# Patient Record
Sex: Female | Born: 1955
Health system: Southern US, Community
[De-identification: ages and names within clinical notes are randomized; demographics above are authoritative.]

## PROBLEM LIST (undated history)

## (undated) DIAGNOSIS — R920 Mammographic microcalcification found on diagnostic imaging of breast: Secondary | ICD-10-CM

## (undated) DIAGNOSIS — Z9221 Personal history of antineoplastic chemotherapy: Secondary | ICD-10-CM

## (undated) DIAGNOSIS — T7840XA Allergy, unspecified, initial encounter: Secondary | ICD-10-CM

## (undated) DIAGNOSIS — K219 Gastro-esophageal reflux disease without esophagitis: Secondary | ICD-10-CM

## (undated) DIAGNOSIS — C50419 Malignant neoplasm of upper-outer quadrant of unspecified female breast: Secondary | ICD-10-CM

## (undated) DIAGNOSIS — M1990 Unspecified osteoarthritis, unspecified site: Secondary | ICD-10-CM

## (undated) DIAGNOSIS — E119 Type 2 diabetes mellitus without complications: Secondary | ICD-10-CM

## (undated) DIAGNOSIS — F329 Major depressive disorder, single episode, unspecified: Secondary | ICD-10-CM

## (undated) DIAGNOSIS — Z923 Personal history of irradiation: Secondary | ICD-10-CM

## (undated) DIAGNOSIS — F32A Depression, unspecified: Secondary | ICD-10-CM

## (undated) DIAGNOSIS — E785 Hyperlipidemia, unspecified: Secondary | ICD-10-CM

## (undated) DIAGNOSIS — R7309 Other abnormal glucose: Secondary | ICD-10-CM

## (undated) DIAGNOSIS — I10 Essential (primary) hypertension: Secondary | ICD-10-CM

## (undated) DIAGNOSIS — M75100 Unspecified rotator cuff tear or rupture of unspecified shoulder, not specified as traumatic: Secondary | ICD-10-CM

## (undated) DIAGNOSIS — R0600 Dyspnea, unspecified: Secondary | ICD-10-CM

## (undated) DIAGNOSIS — C50919 Malignant neoplasm of unspecified site of unspecified female breast: Secondary | ICD-10-CM

## (undated) HISTORY — DX: Essential (primary) hypertension: I10

## (undated) HISTORY — DX: Mammographic microcalcification found on diagnostic imaging of breast: R92.0

## (undated) HISTORY — PX: OTHER SURGICAL HISTORY: SHX169

## (undated) HISTORY — DX: Malignant neoplasm of unspecified site of unspecified female breast: C50.919

## (undated) HISTORY — DX: Unspecified osteoarthritis, unspecified site: M19.90

## (undated) HISTORY — PX: EYE SURGERY: SHX253

## (undated) HISTORY — DX: Morbid (severe) obesity due to excess calories: E66.01

## (undated) HISTORY — DX: Depression, unspecified: F32.A

## (undated) HISTORY — DX: Type 2 diabetes mellitus without complications: E11.9

## (undated) HISTORY — DX: Other abnormal glucose: R73.09

## (undated) HISTORY — DX: Malignant neoplasm of upper-outer quadrant of unspecified female breast: C50.419

## (undated) HISTORY — DX: Hyperlipidemia, unspecified: E78.5

## (undated) HISTORY — PX: COLONOSCOPY: SHX174

## (undated) HISTORY — DX: Allergy, unspecified, initial encounter: T78.40XA

## (undated) HISTORY — PX: TUBAL LIGATION: SHX77

---
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
## 2002-06-13 ENCOUNTER — Other Ambulatory Visit: Admission: RE | Admit: 2002-06-13 | Discharge: 2002-06-13 | Payer: Self-pay | Admitting: Family Medicine

## 2003-05-27 DIAGNOSIS — R7309 Other abnormal glucose: Secondary | ICD-10-CM

## 2003-05-27 HISTORY — DX: Other abnormal glucose: R73.09

## 2003-09-07 ENCOUNTER — Other Ambulatory Visit: Admission: RE | Admit: 2003-09-07 | Discharge: 2003-09-07 | Payer: Self-pay | Admitting: Family Medicine

## 2004-10-09 ENCOUNTER — Ambulatory Visit: Payer: Self-pay | Admitting: Family Medicine

## 2004-10-10 ENCOUNTER — Ambulatory Visit: Payer: Self-pay | Admitting: Family Medicine

## 2004-10-14 ENCOUNTER — Ambulatory Visit: Payer: Self-pay | Admitting: Family Medicine

## 2004-10-14 ENCOUNTER — Other Ambulatory Visit: Admission: RE | Admit: 2004-10-14 | Discharge: 2004-10-14 | Payer: Self-pay | Admitting: Family Medicine

## 2005-01-19 ENCOUNTER — Ambulatory Visit: Payer: Self-pay | Admitting: Family Medicine

## 2005-02-12 LAB — FECAL OCCULT BLOOD, GUAIAC: Fecal Occult Blood: NEGATIVE

## 2005-05-18 ENCOUNTER — Ambulatory Visit: Payer: Self-pay | Admitting: Family Medicine

## 2005-07-16 ENCOUNTER — Ambulatory Visit: Payer: Self-pay | Admitting: Family Medicine

## 2005-07-31 ENCOUNTER — Ambulatory Visit: Payer: Self-pay | Admitting: Family Medicine

## 2005-08-04 ENCOUNTER — Ambulatory Visit: Payer: Self-pay | Admitting: Family Medicine

## 2005-08-04 ENCOUNTER — Encounter (INDEPENDENT_AMBULATORY_CARE_PROVIDER_SITE_OTHER): Payer: Self-pay | Admitting: *Deleted

## 2005-08-04 ENCOUNTER — Other Ambulatory Visit: Admission: RE | Admit: 2005-08-04 | Discharge: 2005-08-04 | Payer: Self-pay | Admitting: Family Medicine

## 2005-08-19 ENCOUNTER — Ambulatory Visit: Payer: Self-pay | Admitting: Family Medicine

## 2005-10-12 ENCOUNTER — Ambulatory Visit: Payer: Self-pay | Admitting: Family Medicine

## 2006-03-12 ENCOUNTER — Ambulatory Visit: Payer: Self-pay | Admitting: Family Medicine

## 2006-06-03 ENCOUNTER — Ambulatory Visit: Payer: Self-pay | Admitting: Family Medicine

## 2006-10-31 DIAGNOSIS — I10 Essential (primary) hypertension: Secondary | ICD-10-CM | POA: Insufficient documentation

## 2006-11-04 ENCOUNTER — Ambulatory Visit: Payer: Self-pay | Admitting: Family Medicine

## 2006-11-10 ENCOUNTER — Ambulatory Visit: Payer: Self-pay | Admitting: Family Medicine

## 2006-12-16 ENCOUNTER — Ambulatory Visit: Payer: Self-pay | Admitting: Family Medicine

## 2007-02-01 ENCOUNTER — Ambulatory Visit: Payer: Self-pay | Admitting: Family Medicine

## 2007-02-01 LAB — CONVERTED CEMR LAB
ALT: 22 units/L (ref 0–40)
AST: 19 units/L (ref 0–37)
Albumin: 3.5 g/dL (ref 3.5–5.2)
Alkaline Phosphatase: 59 units/L (ref 39–117)
BUN: 10 mg/dL (ref 6–23)
Bilirubin, Direct: 0.1 mg/dL (ref 0.0–0.3)
CO2: 34 meq/L — ABNORMAL HIGH (ref 19–32)
Calcium: 9.1 mg/dL (ref 8.4–10.5)
Chloride: 106 meq/L (ref 96–112)
Cholesterol: 155 mg/dL (ref 0–200)
Creatinine, Ser: 0.8 mg/dL (ref 0.4–1.2)
Creatinine,U: 162.9 mg/dL
GFR calc Af Amer: 97 mL/min
GFR calc non Af Amer: 80 mL/min
Glucose, Bld: 111 mg/dL — ABNORMAL HIGH (ref 70–99)
HDL: 39.9 mg/dL (ref >39.0)
LDL Cholesterol: 94 mg/dL (ref 0–99)
Microalb Creat Ratio: 12.9 mg/g (ref 0.0–30.0)
Microalb, Ur: 2.1 mg/dL — ABNORMAL HIGH (ref 0.0–1.9)
Microalbumin U total vol: 12.9 mg/L
Potassium: 4.3 meq/L (ref 3.5–5.1)
Sodium: 144 meq/L (ref 135–145)
TSH: 3.41 microintl units/mL (ref 0.35–5.50)
Total Bilirubin: 0.5 mg/dL (ref 0.3–1.2)
Total CHOL/HDL Ratio: 3.9
Total Protein: 6.7 g/dL (ref 6.0–8.3)
Triglycerides: 105 mg/dL (ref 0–149)
VLDL: 21 mg/dL (ref 0–40)

## 2007-02-07 ENCOUNTER — Other Ambulatory Visit: Admission: RE | Admit: 2007-02-07 | Discharge: 2007-02-07 | Payer: Self-pay | Admitting: Family Medicine

## 2007-02-07 ENCOUNTER — Ambulatory Visit: Payer: Self-pay | Admitting: Family Medicine

## 2007-02-07 ENCOUNTER — Encounter: Payer: Self-pay | Admitting: Family Medicine

## 2007-02-07 LAB — CONVERTED CEMR LAB: Pap Smear: NORMAL

## 2007-02-17 ENCOUNTER — Ambulatory Visit: Payer: Self-pay | Admitting: Orthopedic Surgery

## 2007-02-24 LAB — CONVERTED CEMR LAB
OCCULT 1: NEGATIVE
OCCULT 2: NEGATIVE
OCCULT 3: NEGATIVE

## 2007-02-25 ENCOUNTER — Ambulatory Visit: Payer: Self-pay | Admitting: Orthopedic Surgery

## 2007-02-25 ENCOUNTER — Other Ambulatory Visit: Payer: Self-pay

## 2007-03-04 ENCOUNTER — Ambulatory Visit: Payer: Self-pay | Admitting: Orthopedic Surgery

## 2007-03-04 HISTORY — PX: KNEE ARTHROSCOPY: SUR90

## 2007-03-07 ENCOUNTER — Ambulatory Visit: Payer: Self-pay | Admitting: Family Medicine

## 2007-03-08 ENCOUNTER — Encounter: Payer: Self-pay | Admitting: Family Medicine

## 2007-03-18 ENCOUNTER — Encounter: Payer: Self-pay | Admitting: Family Medicine

## 2007-03-21 DIAGNOSIS — T7840XA Allergy, unspecified, initial encounter: Secondary | ICD-10-CM | POA: Insufficient documentation

## 2007-03-21 DIAGNOSIS — R739 Hyperglycemia, unspecified: Secondary | ICD-10-CM | POA: Insufficient documentation

## 2007-03-22 ENCOUNTER — Ambulatory Visit: Payer: Self-pay | Admitting: Family Medicine

## 2007-04-07 ENCOUNTER — Ambulatory Visit: Payer: Self-pay | Admitting: Gastroenterology

## 2007-04-21 ENCOUNTER — Encounter: Payer: Self-pay | Admitting: Gastroenterology

## 2007-04-21 ENCOUNTER — Encounter: Payer: Self-pay | Admitting: Family Medicine

## 2007-04-21 ENCOUNTER — Ambulatory Visit: Payer: Self-pay | Admitting: Gastroenterology

## 2007-04-21 LAB — HM COLONOSCOPY: HM Colonoscopy: ABNORMAL

## 2007-05-12 ENCOUNTER — Ambulatory Visit: Payer: Self-pay | Admitting: Family Medicine

## 2007-06-21 ENCOUNTER — Encounter: Payer: Self-pay | Admitting: Family Medicine

## 2007-06-30 ENCOUNTER — Ambulatory Visit: Payer: Self-pay | Admitting: Family Medicine

## 2007-07-25 ENCOUNTER — Other Ambulatory Visit: Payer: Self-pay

## 2007-07-25 ENCOUNTER — Ambulatory Visit: Payer: Self-pay | Admitting: Orthopedic Surgery

## 2007-07-30 HISTORY — PX: JOINT REPLACEMENT: SHX530

## 2007-08-03 ENCOUNTER — Inpatient Hospital Stay: Payer: Self-pay | Admitting: Orthopedic Surgery

## 2007-08-23 ENCOUNTER — Ambulatory Visit: Payer: Self-pay | Admitting: Family Medicine

## 2008-02-09 ENCOUNTER — Ambulatory Visit: Payer: Self-pay | Admitting: Family Medicine

## 2008-02-09 LAB — CONVERTED CEMR LAB
ALT: 25 units/L (ref 0–35)
AST: 23 units/L (ref 0–37)
Albumin: 3.7 g/dL (ref 3.5–5.2)
Alkaline Phosphatase: 73 units/L (ref 39–117)
BUN: 7 mg/dL (ref 6–23)
Bilirubin, Direct: 0.1 mg/dL (ref 0.0–0.3)
CO2: 33 meq/L — ABNORMAL HIGH (ref 19–32)
Calcium: 9.3 mg/dL (ref 8.4–10.5)
Chloride: 103 meq/L (ref 96–112)
Cholesterol: 149 mg/dL (ref 0–200)
Creatinine, Ser: 0.9 mg/dL (ref 0.4–1.2)
Creatinine,U: 162.1 mg/dL
GFR calc Af Amer: 84 mL/min
GFR calc non Af Amer: 70 mL/min
Glucose, Bld: 111 mg/dL — ABNORMAL HIGH (ref 70–99)
HDL: 37.7 mg/dL — ABNORMAL LOW (ref >39.0)
LDL Cholesterol: 90 mg/dL (ref 0–99)
Microalb Creat Ratio: 4.3 mg/g (ref 0.0–30.0)
Microalb, Ur: 0.7 mg/dL (ref 0.0–1.9)
Potassium: 4.3 meq/L (ref 3.5–5.1)
Sodium: 139 meq/L (ref 135–145)
TSH: 2.55 microintl units/mL (ref 0.35–5.50)
Total Bilirubin: 0.6 mg/dL (ref 0.3–1.2)
Total CHOL/HDL Ratio: 4
Total Protein: 6.9 g/dL (ref 6.0–8.3)
Triglycerides: 107 mg/dL (ref 0–149)
VLDL: 21 mg/dL (ref 0–40)

## 2008-02-14 ENCOUNTER — Ambulatory Visit: Payer: Self-pay | Admitting: Family Medicine

## 2008-02-14 ENCOUNTER — Other Ambulatory Visit: Admission: RE | Admit: 2008-02-14 | Discharge: 2008-02-14 | Payer: Self-pay | Admitting: Family Medicine

## 2008-02-14 ENCOUNTER — Encounter: Payer: Self-pay | Admitting: Family Medicine

## 2008-02-21 ENCOUNTER — Encounter (INDEPENDENT_AMBULATORY_CARE_PROVIDER_SITE_OTHER): Payer: Self-pay | Admitting: *Deleted

## 2008-02-21 ENCOUNTER — Ambulatory Visit: Payer: Self-pay | Admitting: Family Medicine

## 2008-02-21 ENCOUNTER — Encounter: Payer: Self-pay | Admitting: Family Medicine

## 2008-02-23 ENCOUNTER — Encounter (INDEPENDENT_AMBULATORY_CARE_PROVIDER_SITE_OTHER): Payer: Self-pay | Admitting: *Deleted

## 2008-02-24 ENCOUNTER — Telehealth: Payer: Self-pay | Admitting: Family Medicine

## 2008-03-28 ENCOUNTER — Ambulatory Visit: Payer: Self-pay | Admitting: Family Medicine

## 2008-03-29 LAB — CONVERTED CEMR LAB
ALT: 35 units/L (ref 0–35)
AST: 28 units/L (ref 0–37)

## 2008-05-11 ENCOUNTER — Ambulatory Visit: Payer: Self-pay | Admitting: Family Medicine

## 2008-05-13 LAB — CONVERTED CEMR LAB
ALT: 34 units/L (ref 0–35)
AST: 29 units/L (ref 0–37)
Cholesterol: 154 mg/dL (ref 0–200)
HDL: 33.1 mg/dL — ABNORMAL LOW (ref >39.0)
LDL Cholesterol: 99 mg/dL (ref 0–99)
Total CHOL/HDL Ratio: 4.7
Triglycerides: 112 mg/dL (ref 0–149)
VLDL: 22 mg/dL (ref 0–40)

## 2008-05-17 ENCOUNTER — Ambulatory Visit: Payer: Self-pay | Admitting: Family Medicine

## 2008-10-05 ENCOUNTER — Telehealth: Payer: Self-pay | Admitting: Family Medicine

## 2008-10-15 ENCOUNTER — Ambulatory Visit: Payer: Self-pay | Admitting: Family Medicine

## 2008-10-15 LAB — CONVERTED CEMR LAB: Glucose, Bld: 96 mg/dL (ref 70–99)

## 2008-10-23 ENCOUNTER — Ambulatory Visit: Payer: Self-pay | Admitting: Family Medicine

## 2009-02-08 ENCOUNTER — Ambulatory Visit: Payer: Self-pay | Admitting: Family Medicine

## 2009-02-12 ENCOUNTER — Ambulatory Visit: Payer: Self-pay | Admitting: Family Medicine

## 2009-02-13 LAB — CONVERTED CEMR LAB
ALT: 22 units/L (ref 0–35)
AST: 19 units/L (ref 0–37)
Albumin: 3.4 g/dL — ABNORMAL LOW (ref 3.5–5.2)
Alkaline Phosphatase: 74 units/L (ref 39–117)
BUN: 10 mg/dL (ref 6–23)
Basophils Absolute: 0.1 10*3/uL (ref 0.0–0.1)
Basophils Relative: 0.7 % (ref 0.0–3.0)
Bilirubin, Direct: 0.1 mg/dL (ref 0.0–0.3)
CO2: 33 meq/L — ABNORMAL HIGH (ref 19–32)
Calcium: 9 mg/dL (ref 8.4–10.5)
Chloride: 105 meq/L (ref 96–112)
Cholesterol: 135 mg/dL (ref 0–200)
Creatinine, Ser: 0.9 mg/dL (ref 0.4–1.2)
Creatinine,U: 232.8 mg/dL
Eosinophils Absolute: 0.5 10*3/uL (ref 0.0–0.7)
Eosinophils Relative: 4.8 % (ref 0.0–5.0)
GFR calc non Af Amer: 69.2 mL/min (ref >60)
Glucose, Bld: 99 mg/dL (ref 70–99)
HCT: 43.5 % (ref 36.0–46.0)
HDL: 29.5 mg/dL — ABNORMAL LOW (ref >39.00)
Hemoglobin: 15.1 g/dL — ABNORMAL HIGH (ref 12.0–15.0)
LDL Cholesterol: 79 mg/dL (ref 0–99)
Lymphocytes Relative: 24.4 % (ref 12.0–46.0)
Lymphs Abs: 2.4 10*3/uL (ref 0.7–4.0)
MCHC: 34.7 g/dL (ref 30.0–36.0)
MCV: 89.5 fL (ref 78.0–100.0)
Microalb Creat Ratio: 0.9 mg/g (ref 0.0–30.0)
Microalb, Ur: 0.2 mg/dL (ref 0.0–1.9)
Monocytes Absolute: 0.6 10*3/uL (ref 0.1–1.0)
Monocytes Relative: 6.2 % (ref 3.0–12.0)
Neutro Abs: 6.1 10*3/uL (ref 1.4–7.7)
Neutrophils Relative %: 63.9 % (ref 43.0–77.0)
Platelets: 250 10*3/uL (ref 150.0–400.0)
Potassium: 4.3 meq/L (ref 3.5–5.1)
RBC: 4.86 M/uL (ref 3.87–5.11)
RDW: 12.5 % (ref 11.5–14.6)
Sodium: 142 meq/L (ref 135–145)
TSH: 2.76 microintl units/mL (ref 0.35–5.50)
Total Bilirubin: 0.6 mg/dL (ref 0.3–1.2)
Total CHOL/HDL Ratio: 5
Total Protein: 6.7 g/dL (ref 6.0–8.3)
Triglycerides: 132 mg/dL (ref 0.0–149.0)
VLDL: 26.4 mg/dL (ref 0.0–40.0)
WBC: 9.7 10*3/uL (ref 4.5–10.5)

## 2009-02-19 ENCOUNTER — Ambulatory Visit: Payer: Self-pay | Admitting: Family Medicine

## 2009-02-19 ENCOUNTER — Encounter: Payer: Self-pay | Admitting: Family Medicine

## 2009-02-19 ENCOUNTER — Other Ambulatory Visit: Admission: RE | Admit: 2009-02-19 | Discharge: 2009-02-19 | Payer: Self-pay | Admitting: Family Medicine

## 2009-02-21 ENCOUNTER — Encounter (INDEPENDENT_AMBULATORY_CARE_PROVIDER_SITE_OTHER): Payer: Self-pay | Admitting: *Deleted

## 2009-05-21 ENCOUNTER — Ambulatory Visit: Payer: Self-pay | Admitting: Family Medicine

## 2009-09-03 ENCOUNTER — Ambulatory Visit: Payer: Self-pay | Admitting: Family Medicine

## 2009-10-15 ENCOUNTER — Ambulatory Visit: Payer: Self-pay | Admitting: Family Medicine

## 2009-10-15 LAB — CONVERTED CEMR LAB
BUN: 8 mg/dL (ref 6–23)
CO2: 31 meq/L (ref 19–32)
Calcium: 9 mg/dL (ref 8.4–10.5)
Chloride: 104 meq/L (ref 96–112)
Creatinine, Ser: 0.9 mg/dL (ref 0.4–1.2)
GFR calc non Af Amer: 69.02 mL/min (ref >60)
Glucose, Bld: 114 mg/dL — ABNORMAL HIGH (ref 70–99)
Potassium: 4.6 meq/L (ref 3.5–5.1)
Sodium: 142 meq/L (ref 135–145)

## 2009-10-22 ENCOUNTER — Ambulatory Visit: Payer: Self-pay | Admitting: Family Medicine

## 2009-10-22 ENCOUNTER — Telehealth: Payer: Self-pay | Admitting: Family Medicine

## 2010-02-26 ENCOUNTER — Ambulatory Visit: Payer: Self-pay | Admitting: Family Medicine

## 2010-02-26 ENCOUNTER — Telehealth: Payer: Self-pay | Admitting: Family Medicine

## 2010-02-26 ENCOUNTER — Other Ambulatory Visit: Admission: RE | Admit: 2010-02-26 | Discharge: 2010-02-26 | Payer: Self-pay | Admitting: Family Medicine

## 2010-02-26 DIAGNOSIS — K5900 Constipation, unspecified: Secondary | ICD-10-CM | POA: Insufficient documentation

## 2010-02-26 DIAGNOSIS — K644 Residual hemorrhoidal skin tags: Secondary | ICD-10-CM | POA: Insufficient documentation

## 2010-02-26 LAB — CONVERTED CEMR LAB: Pap Smear: NORMAL

## 2010-02-26 LAB — HM PAP SMEAR

## 2010-03-04 LAB — CONVERTED CEMR LAB: Pap Smear: NEGATIVE

## 2010-03-05 ENCOUNTER — Encounter (INDEPENDENT_AMBULATORY_CARE_PROVIDER_SITE_OTHER): Payer: Self-pay | Admitting: *Deleted

## 2010-03-13 ENCOUNTER — Ambulatory Visit: Payer: Self-pay | Admitting: Family Medicine

## 2010-03-13 LAB — CONVERTED CEMR LAB
ALT: 25 units/L (ref 0–35)
AST: 20 units/L (ref 0–37)
Albumin: 3.6 g/dL (ref 3.5–5.2)
Alkaline Phosphatase: 70 units/L (ref 39–117)
BUN: 13 mg/dL (ref 6–23)
Basophils Absolute: 0.1 10*3/uL (ref 0.0–0.1)
Basophils Relative: 0.5 % (ref 0.0–3.0)
Bilirubin, Direct: 0.1 mg/dL (ref 0.0–0.3)
CO2: 33 meq/L — ABNORMAL HIGH (ref 19–32)
Calcium: 9.2 mg/dL (ref 8.4–10.5)
Chloride: 101 meq/L (ref 96–112)
Cholesterol: 163 mg/dL (ref 0–200)
Creatinine, Ser: 0.8 mg/dL (ref 0.4–1.2)
Creatinine,U: 132.7 mg/dL
Eosinophils Absolute: 0.5 10*3/uL (ref 0.0–0.7)
Eosinophils Relative: 4.3 % (ref 0.0–5.0)
GFR calc non Af Amer: 78.96 mL/min (ref >60)
Glucose, Bld: 106 mg/dL — ABNORMAL HIGH (ref 70–99)
HCT: 43.1 % (ref 36.0–46.0)
HDL: 39.4 mg/dL (ref >39.00)
Hemoglobin: 14.7 g/dL (ref 12.0–15.0)
LDL Cholesterol: 101 mg/dL — ABNORMAL HIGH (ref 0–99)
Lymphocytes Relative: 24.3 % (ref 12.0–46.0)
Lymphs Abs: 2.7 10*3/uL (ref 0.7–4.0)
MCHC: 34.1 g/dL (ref 30.0–36.0)
MCV: 91.4 fL (ref 78.0–100.0)
Microalb Creat Ratio: 1.7 mg/g (ref 0.0–30.0)
Microalb, Ur: 2.2 mg/dL — ABNORMAL HIGH (ref 0.0–1.9)
Monocytes Absolute: 0.7 10*3/uL (ref 0.1–1.0)
Monocytes Relative: 6.3 % (ref 3.0–12.0)
Neutro Abs: 7.2 10*3/uL (ref 1.4–7.7)
Neutrophils Relative %: 64.6 % (ref 43.0–77.0)
Platelets: 236 10*3/uL (ref 150.0–400.0)
Potassium: 4.4 meq/L (ref 3.5–5.1)
RBC: 4.72 M/uL (ref 3.87–5.11)
RDW: 13.3 % (ref 11.5–14.6)
Sodium: 139 meq/L (ref 135–145)
TSH: 3.15 microintl units/mL (ref 0.35–5.50)
Total Bilirubin: 0.4 mg/dL (ref 0.3–1.2)
Total CHOL/HDL Ratio: 4
Total Protein: 6.7 g/dL (ref 6.0–8.3)
Triglycerides: 114 mg/dL (ref 0.0–149.0)
VLDL: 22.8 mg/dL (ref 0.0–40.0)
WBC: 11.1 10*3/uL — ABNORMAL HIGH (ref 4.5–10.5)

## 2010-03-18 ENCOUNTER — Ambulatory Visit: Payer: Self-pay | Admitting: Family Medicine

## 2010-04-11 ENCOUNTER — Encounter: Payer: Self-pay | Admitting: Family Medicine

## 2010-04-11 ENCOUNTER — Ambulatory Visit: Payer: Self-pay | Admitting: Family Medicine

## 2010-04-11 LAB — HM MAMMOGRAPHY: HM Mammogram: NORMAL

## 2010-04-14 ENCOUNTER — Encounter (INDEPENDENT_AMBULATORY_CARE_PROVIDER_SITE_OTHER): Payer: Self-pay | Admitting: *Deleted

## 2010-06-02 ENCOUNTER — Encounter (INDEPENDENT_AMBULATORY_CARE_PROVIDER_SITE_OTHER): Payer: Self-pay | Admitting: *Deleted

## 2010-07-27 ENCOUNTER — Observation Stay (HOSPITAL_COMMUNITY): Admission: EM | Admit: 2010-07-27 | Discharge: 2010-07-28 | Payer: Self-pay | Admitting: Emergency Medicine

## 2010-07-27 ENCOUNTER — Ambulatory Visit: Payer: Self-pay | Admitting: Cardiology

## 2010-08-11 ENCOUNTER — Encounter: Payer: Self-pay | Admitting: Cardiology

## 2010-08-11 ENCOUNTER — Ambulatory Visit: Payer: Self-pay | Admitting: Cardiology

## 2010-08-11 ENCOUNTER — Ambulatory Visit (HOSPITAL_COMMUNITY)
Admission: RE | Admit: 2010-08-11 | Discharge: 2010-08-11 | Payer: Self-pay | Source: Home / Self Care | Admitting: Cardiology

## 2010-08-11 ENCOUNTER — Ambulatory Visit: Payer: Self-pay

## 2010-08-25 ENCOUNTER — Encounter: Payer: Self-pay | Admitting: Physician Assistant

## 2010-08-25 ENCOUNTER — Ambulatory Visit: Payer: Self-pay | Admitting: Cardiology

## 2010-08-25 DIAGNOSIS — R05 Cough: Secondary | ICD-10-CM | POA: Insufficient documentation

## 2010-08-25 DIAGNOSIS — E785 Hyperlipidemia, unspecified: Secondary | ICD-10-CM | POA: Insufficient documentation

## 2010-08-25 DIAGNOSIS — R059 Cough, unspecified: Secondary | ICD-10-CM | POA: Insufficient documentation

## 2010-08-25 DIAGNOSIS — R079 Chest pain, unspecified: Secondary | ICD-10-CM | POA: Insufficient documentation

## 2010-11-25 NOTE — Assessment & Plan Note (Signed)
Summary: rash on back over a month/bir  Medications Added METOPROLOL SUCCINATE 100 MG TB24 (METOPROLOL SUCCINATE) 1 tablet by mouth every night TYLENOL PM EXTRA STRENGTH 500-25 MG  TABS (DIPHENHYDRAMINE-APAP (SLEEP)) 1 hs as needed ELOCON 0.1 %  CREA (MOMETASONE FUROATE) apply lthinnly two times a day to rash area      Allergies Added: NKDA  Vital Signs:  Patient Profile:   55 Years Old Female Weight:      269 pounds Temp:     98.9 degrees F oral Pulse rate:   91 / minute BP sitting:   109 / 92  (right arm) Cuff size:   large  Vitals Entered By: Cooper Render (May 12, 2007 3:59 PM)                Chief Complaint:  rash on back over 1 mth.  History of Present Illness: Here for rash on back x65mo, itches, no pain.  Has gotten larger,no drainage.  Not sure if any changes in detergent,etc in this period of time.  Current Allergies: No known allergies   Past Medical History:    Reviewed history from 03/18/2007 and no changes required:       Hypertension       Elevated glucose- 106 (05/2003)       Family hx. of polyps     Review of Systems      See HPI   Physical Exam  General:     alert, well-developed, well-nourished, well-hydrated, and overweight-appearing.   Head:     normocephalic.   Lungs:     normal respiratory effort.   Neurologic:     alert & oriented X3, sensation intact to light touch, and gait normal.   Skin:     flat pink non scaling plaque with faint/narrow darker pink boarder which is irregular.  Located under bra at the strap attatchment.  5x8cm at longest and widest Psych:     normally interactive.      Impression & Recommendations:  Problem # 1:  SKIN RASH (ICD-782.1) Assessment: New  Her updated medication list for this problem includes:    Elocon 0.1 % Crea (Mometasone furoate) .[name]... Apply lthinnly two times a day to rash area call or see back 1 wk, if not improved will refer to Derm   Medications Added to Medication List This Visit: 1)  Metoprolol Succinate 100 Mg Tb24 (Metoprolol succinate) .[name].. 1 tablet by mouth every night 2)  Tylenol Pm Extra Strength 500-25 Mg Tabs (Diphenhydramine-apap (sleep)) .[name].. 1 hs as needed 3)  Elocon 0.1 % Crea (Mometasone furoate) .... Apply lthinnly two times a day to rash area   Patient Instructions: 1)  reviewed plan with patient--agreed    Prescriptions: ELOCON 0.1 %  CREA (MOMETASONE FUROATE) apply lthinnly two times a day to rash area  #1 x 0   Entered and Authorized by:   Gildardo Griffes FNP   Signed by:   Gildardo Griffes FNP on 05/12/2007   Method used:   Print then Give to Patient   RxID:   [id number]

## 2010-11-25 NOTE — Assessment & Plan Note (Signed)
Summary: 3 MO. F/U RECHECK BP/BIR   Vital Signs:  Patient profile:   55 year old female Weight:      285 pounds Temp:     98.5 degrees F oral Pulse rate:   64 / minute Pulse rhythm:   regular BP sitting:   130 / 100  (left arm) Cuff size:   large  Vitals Entered By: Sydell Axon LPN (September 03, 2009 9:05 AM) CC: Follow-up on BP, has sinus drainage and a cough   History of Present Illness: Pt here for 3 month followupmfor BP elevation. Hadn't taken her medication last time because of rushing and thinks her pressure has been good since but hasn't taken it in the last few weeks. She feels well with no complaints.  Problems Prior to Update: 1)  Health Maintenance Exam  (ICD-V70.0) 2)  Hx, Family, Digestive Disorder, Colonic Polyp  (ICD-V18.51) 3)  Hyperglycemia  (ICD-790.6) 4)  Hx of Allergy  (ICD-995.3) 5)  Hx of Hypercholesterolemia  (ICD-272.0) 6)  Hypertension  (ICD-401.9) 7)  Screening For Malignannt Neoplasm, Site Nec  (ICD-V76.49)  Medications Prior to Update: 1)  Simvastatin 40 Mg  Tabs (Simvastatin) .... 2 Tabs By Mouth Hs 2)  Zyrtec Allergy 10 Mg Tabs (Cetirizine Hcl) .... One Tab By Mouth Once Daily As Needed Allergies. 3)  Metoprolol Tartrate 100 Mg Tabs (Metoprolol Tartrate) .... One Tab By Mouth Two Times A Day 4)  Tylenol Pm Extra Strength 500-25 Mg  Tabs (Diphenhydramine-Apap (Sleep)) .[name].. 1 Hs As Needed 5)  Lamisil At 1 %  Crea (Terbinafine Hcl) .... Apply To Area Bid 6)  Hycodan .[name].. 1 Tsp At Bedtime As Needed Cough, May Repeat in 4-6 Hrs If Needed--Caution Re Drowsiness 7)  Black Cohosh 160 Mg Caps (Black Cohosh) .[name].. 1 Daily By Mouth 8)  Lotemax 0.5 % Susp (Loteprednol Etabonate) .... 2 Dropps Both Eyes Twice A Day  Allergies: 1)  ! Sulfa  Physical Exam  General:  Well-developed,well-nourished,in no acute distress; alert,appropriate and cooperative throughout examination, obese.  Head:  Normocephalic and atraumatic without obvious abnormalities. No apparent alopecia or balding. Eyes:  Conjunctiva clear bilaterally.  PERRLA, EOMI Ears:  External ear exam shows no significant lesions or deformities.  Otoscopic examination reveals clear canals, tympanic membranes are intact bilaterally without bulging, retraction, inflammation or discharge. Hearing is grossly normal bilaterally. Nose:  External nasal examination shows no deformity or inflammation. Nasal mucosa are pink and moist without lesions or exudates. Mouth:  Oral mucosa and oropharynx without lesions or exudates.  Teeth in good repair. Neck:  No deformities, masses, or tenderness noted. Chest Wall:  No deformities, masses, or tenderness noted. Lungs:  Normal respiratory effort, chest expands symmetrically. Lungs are clear to auscultation, no crackles or wheezes. Heart:  Normal rate and regular rhythm. S1 and S2 normal without gallop, murmur, click, rub or other extra sounds.   Impression & Recommendations:  Problem # 1:  HYPERTENSION (ICD-401.9) Assessment Unchanged Some improvement but still high.  Add lisinopril with FH of diabetes. Start half tab fopr first week and then 10mg  a day. Her updated medication list for this problem includes:    Metoprolol Tartrate 100 Mg Tabs (Metoprolol tartrate) ..... One tab by mouth two times a day    Lisinopril 10 Mg Tabs (Lisinopril) ..... One tab by mouth at night  BP today: 130/100 Prior BP: 154/100 (05/21/2009)  Labs Reviewed: K+: 4.3 (02/12/2009) Creat: : 0.9 (02/12/2009)   Chol: 135 (02/12/2009)   HDL: 29.50 (02/12/2009)  LDL: 79 (02/12/2009)   TG: 132.0 (02/12/2009)  Complete Medication List: 1)  Simvastatin 40 Mg Tabs (Simvastatin) .... 2 tabs by mouth at bedtime 2)  Zyrtec Allergy 10 Mg Tabs (Cetirizine hcl) .... One tab by mouth once daily as needed allergies. 3)  Metoprolol Tartrate 100 Mg Tabs (Metoprolol tartrate) .... One tab by mouth two times a day  4)  Tylenol Pm Extra Strength 500-25 Mg Tabs (Diphenhydramine-apap (sleep)) .[name].. 1 hs as needed 5)  Lamisil At 1 % Crea (Terbinafine hcl) .... Apply to area bid 6)  Hycodan  .[name].. 1 tsp at bedtime as needed cough, may repeat in 4-6 hrs if needed--caution re drowsiness 7)  Black Cohosh 160 Mg Caps (Black cohosh) .[name].. 1 daily by mouth 8)  Lotemax 0.5 % Susp (Loteprednol etabonate) .... 2 dropps both eyes twice a day 9)  Lisinopril 10 Mg Tabs (Lisinopril) .... One tab by mouth at night  Patient Instructions: 1)  RTC 6 weeks for BP check. Bmet at 1 month 401.9 2)  RTC early May for Comp Exam, labs prior 3)  For congestion, Take Guaifenesin by going to [facility], [facility] or [facility] and getting MUCOUS RELIEF EXPECTORANT (400mg ), take 11/2 tabs by mouth AM and NOON. 4)  Drink lots of fluids anytime taking Guaifenesin.  Prescriptions: LISINOPRIL 10 MG TABS (LISINOPRIL) one tab by mouth at night  #30 x 12   Entered and Authorized by:   Shaune Leeks MD   Signed by:   Shaune Leeks MD on 09/03/2009   Method used:   Electronically to        [facility] [phone]* (retail)       [address]       [place], [place]  [address]       Ph: [phone]       Fax: [phone]   RxID:   [id number]   Current Allergies (reviewed today): ! SULFA

## 2010-11-25 NOTE — Procedures (Signed)
Summary: Gastroenterology  Gastroenterology   Imported By: Beau Fanny 04/27/2007 10:13:27  _____________________________________________________________________  External Attachment:    Type:   Image     Comment:   External Document

## 2010-11-25 NOTE — Assessment & Plan Note (Signed)
Summary: cpx/bir   Vital Signs:  Patient profile:   55 year old female Height:      65 inches Weight:      273 pounds Temp:     98.7 degrees F oral Pulse rate:   76 / minute Pulse rhythm:   regular BP sitting:   148 / 100  (left arm) Cuff size:   large  Vitals Entered By: Providence Crosby (February 19, 2009 10:57 AM) CC: check up// colonoscopy with dr. Russella Dar 04/21/2007// complains of tongue feeling funny since starting medication  minocin and hycodan and robitussin   History of Present Illness: Pt here for Comp Exam, doing well except for tongue feeling funny. Was treated one week ago for Bronchitis with minocin, Hycodan and Guaif. She otherwise feels well. Weight continues to be a problem....encouraged REGULAR exercise.  Preventive Screening-Counseling & Management     Alcohol drinks/day: <1     Alcohol type: liquor/wine     Smoking Status: quit     Year Quit: 1970     Pack years: 1     Passive Smoke Exposure: no     Caffeine use/day: 1     Does Patient Exercise: no  Problems Prior to Update: 1)  Bronchitis-acute  (ICD-466.0) 2)  Health Maintenance Exam  (ICD-V70.0) 3)  Fungal Dermatitis  (ICD-111.9) 4)  Hx, Family, Digestive Disorder, Colonic Polyp  (ICD-V18.51) 5)  Hyperglycemia  (ICD-790.6) 6)  Hx of Allergy  (ICD-995.3) 7)  Hx of Hypercholesterolemia  (ICD-272.0) 8)  Hypertension  (ICD-401.9) 9)  Screening For Malignannt Neoplasm, Site Nec  (ICD-V76.49)  Medications Prior to Update: 1)  Simvastatin 40 Mg  Tabs (Simvastatin) .... 2 Tabs By Mouth Hs 2)  Zyrtec Allergy 10 Mg Tabs (Cetirizine Hcl) .... One Tab By Mouth Once Daily As Needed Allergies. 3)  Metoprolol Tartrate 50 Mg  Tabs (Metoprolol Tartrate) .... One Tab By Mouth Two Times A Day 4)  Tylenol Pm Extra Strength 500-25 Mg  Tabs (Diphenhydramine-Apap (Sleep)) .[name].. 1 Hs As Needed 5)  Lamisil At 1 %  Crea (Terbinafine Hcl) .... Apply To Area Bid 6)  Minocin 100 Mg Caps (Minocycline Hcl) .[name].. 1 Two Times A Day  7)  Hycodan .[name].. 1 Tsp At Bedtime As Needed Cough, May Repeat in 4-6 Hrs If Needed--Caution Re Drowsiness  Allergies (verified): No Known Drug Allergies  Past History:  Past Medical History:    Hypertension    Elevated glucose- 106 (05/2003)    Family hx. of polyps     (03/18/2007)  Past Surgical History:    NSVD x 2    BTL 1978    L Knee Arthroscopy (Dr Kennith Center )  03/04/2007    Colonoscopy polyps  (Dr Russella Dar)  04/2007      2013    LTKR  (Dr Kennith Center) 07/30/2007                                 (02/14/2008)  Family History:    Father: A 60  polyps    Mother A 56  HTN, depression, back surgery, paranoia     Siblings: Brother A                  Brother A                  Brother A MI  PTCA     (06/30/2007)  Social History:    Marital Status:  Married    Children: 2    Occupation: Solicitor parttime     (02/14/2008)  Risk Factors:    Alcohol Use: <1 (06/30/2007)    >5 drinks/d w/in last 3 months: N/A    Caffeine Use: 1 (06/30/2007)    Diet: N/A    Exercise: no (06/30/2007)  Risk Factors:    Smoking Status: quit (03/18/2007)    Packs/Day: N/A    Cigars/wk: N/A    Pipe Use/wk: N/A    Cans of tobacco/wk: N/A    Passive Smoke Exposure: no (06/30/2007)  Family History:    Father: A 79  polyps Fx ankle              Robt and Earnest Conroy    Mother A 78  HTN, depression, back surgery, paranoia     Siblings: Brother A 62                  Brother A 53                  Brother A 42 MI  PTCA@39   Social History:    Marital Status: Married    Children: 2    Occupation:     Engineer, 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 Eyes:  Denies blurring, discharge, double vision, eye irritation, eye pain, halos, itching, light sensitivity, red eye, vision loss-1 eye, and vision loss-both eyes; allergies.  ENT:  Denies decreased hearing, difficulty swallowing, ear discharge, earache, hoarseness, nasal congestion, nosebleeds, postnasal drainage, ringing in ears, sinus pressure, and sore throat. CV:  Denies bluish discoloration of lips or nails, chest pain or discomfort, difficulty breathing at night, difficulty breathing while lying down, fainting, fatigue, leg cramps with exertion, lightheadness, near fainting, palpitations, shortness of breath with exertion, swelling of feet, swelling of hands, and weight gain. Resp:  Denies chest discomfort, chest pain with inspiration, cough, coughing up blood, excessive snoring, hypersomnolence, morning headaches, pleuritic, shortness of breath, sputum productive, and wheezing. GI:  Denies abdominal pain, bloody stools, change in bowel habits, constipation, dark tarry stools, diarrhea, excessive appetite, gas, hemorrhoids, indigestion, loss of appetite, nausea, vomiting, vomiting blood, and yellowish skin color. GU:  Denies abnormal vaginal bleeding, decreased libido, discharge, dysuria, genital sores, hematuria, incontinence, nocturia, urinary frequency, and urinary hesitancy; mild leakage with current cough. MS:  Denies joint pain, joint redness, joint swelling, loss of strength, low back pain, mid back pain, muscle aches, muscle , cramps, muscle weakness, stiffness, and thoracic pain. Derm:  Denies changes in color of skin, changes in nail beds, dryness, excessive perspiration, flushing, hair loss, insect bite(s), itching, lesion(s), poor wound healing, and rash. Neuro:  Denies brief paralysis, difficulty with concentration, disturbances in coordination, falling down, headaches, inability to speak, memory loss, numbness, poor balance, seizures, sensation of room spinning, tingling, tremors, visual disturbances, and weakness.  Physical Exam  General:  Well-developed,well-nourished,in no acute distress; alert,appropriate and cooperative throughout examination, obese.  Head:  Normocephalic and atraumatic without obvious abnormalities. No apparent alopecia or balding. Eyes:  Conjunctiva clear bilaterally.  PERRLA, EOMI Ears:  External ear exam shows no significant lesions or deformities.  Otoscopic examination reveals clear canals, tympanic membranes are intact bilaterally without bulging, retraction, inflammation or discharge. Hearing is grossly normal bilaterally. Nose:  External nasal examination shows no deformity or inflammation. Nasal mucosa are pink and moist without lesions  or exudates. Mouth:  Oral mucosa and oropharynx without lesions or exudates.  Teeth in good repair. Neck:  No deformities, masses, or tenderness noted. Chest Wall:  No deformities, masses, or tenderness noted. Breasts:  No mass, nodules, thickening, tenderness, bulging, retraction, inflamation, nipple discharge or skin changes noted.   Lungs:  Normal respiratory effort, chest expands symmetrically. Lungs are clear to auscultation, no crackles or wheezes. Heart:  Normal rate and regular rhythm. S1 and S2 normal without gallop, murmur, click, rub or other extra sounds. Abdomen:  Bowel sounds positive,abdomen soft and non-tender without masses, organomegaly or hernias noted. Protuberant. Rectal:  No external abnormalities noted. Normal sphincter tone. No rectal masses or tenderness. G neg. Genitalia:  Pelvic Exam:        External: normal female genitalia without lesions or masses        Vagina: normal without lesions or masses        Cervix: normal without lesions or masses        Adnexa: normal bimanual exam without masses or fullness        Uterus: normal by palpation        Pap smear: performed Msk:  No deformity or scoliosis noted of thoracic or lumbar spine.   Pulses:  R and L carotid,radial,femoral,dorsalis pedis and posterior tibial pulses are full and equal bilaterally Extremities:  Bilat little finger swelling and mild erythema of PIP and DIPs. R>L.  Neurologic:  No cranial nerve deficits noted. Station and gait are normal. Plantar reflexes are down-going bilaterally. DTRs are symmetrical throughout. Sensory, motor and coordinative functions appear intact. Skin:  Intact without suspicious lesions or rashes Cervical Nodes:  no anterior cervical adenopathy and no posterior cervical adenopathy.   Axillary Nodes:  No palpable lymphadenopathy Inguinal Nodes:  No significant adenopathy Psych:  normally interactive and good eye contact.     Impression & Recommendations:  Problem # 1:  HEALTH MAINTENANCE EXAM (ICD-V70.0) Assessment Comment Only  Problem # 2:  HYPERGLYCEMIA (ICD-790.6) Assessment: Improved Nml today. Discussed.  Problem # 3:  Hx of ALLERGY (ICD-995.3) Assessment: Deteriorated Try changing from Zyrtec to Claritin or Allegra.  Problem # 4:  Hx of HYPERCHOLESTEROLEMIA (ICD-272.0) Assessment: Unchanged  HDL low but otherwise ok. Her updated medication list for this problem includes:    Simvastatin 40 Mg Tabs (Simvastatin) .[name]... 2 tabs by mouth hs  Labs Reviewed: SGOT: 19 (02/12/2009)   SGPT: 22 (02/12/2009)   HDL:29.50 (02/12/2009), 33.1 (05/11/2008)  LDL:79 (02/12/2009), 99 (05/11/2008)  Chol:135 (02/12/2009), 154 (05/11/2008)  Trig:132.0 (02/12/2009), 112 (05/11/2008)  Problem # 5:  HYPERTENSION (ICD-401.9) Will incease to 100mg  two times a day. Recheck three mos. Her updated medication list for this problem includes:    Metoprolol Tartrate 100 Mg Tabs (Metoprolol tartrate) ..... One tab by mouth two times a day  BP today: 148/100 Prior BP: 140/80 (02/08/2009)  Labs Reviewed: K+: 4.3 (02/12/2009) Creat: : 0.9 (02/12/2009)   Chol: 135 (02/12/2009)   HDL: 29.50 (02/12/2009)   LDL: 79 (02/12/2009)   TG: 132.0 (02/12/2009)  Complete Medication List: 1)  Simvastatin 40 Mg Tabs (Simvastatin) .... 2 tabs by mouth hs 2)  Zyrtec Allergy 10 Mg Tabs (Cetirizine hcl) .... One tab by mouth once daily as needed allergies.  3)  Metoprolol Tartrate 100 Mg Tabs (Metoprolol tartrate) .... One tab by mouth two times a day 4)  Tylenol Pm Extra Strength 500-25 Mg Tabs (Diphenhydramine-apap (sleep)) .[name].. 1 hs as needed 5)  Lamisil At 1 % Crea (Terbinafine hcl) .Marland KitchenMarland KitchenMarland Kitchen  Apply to area bid 6)  Hycodan  .[name].. 1 tsp at bedtime as needed cough, may repeat in 4-6 hrs if needed--caution re drowsiness 7)  Black Cohosh 160 Mg Caps (Black cohosh) .[name].. 1 daily by mouth  Patient Instructions: 1)  RTC 3 mos, check BP. Prescriptions: METOPROLOL TARTRATE 100 MG TABS (METOPROLOL TARTRATE) one tab by mouth two times a day  #60 x 12   Entered and Authorized by:   Shaune Leeks MD   Signed by:   Shaune Leeks MD on 02/19/2009   Method used:   Electronically to        [facility] [phone]* (retail)       [address]       [place], [place]  [address]       Ph: [phone]       Fax: [phone]   RxID:   [id number]

## 2010-11-25 NOTE — Letter (Signed)
Summary: Nadara Eaton letter  [place] [facility]  [address] [place], [place] [address]   Phone: [phone]  Fax: [phone]       06/02/2010 MRN: [id number]  [facility] [address] [facility] [place], [place]  [address]  Dear Ms. [facility],  [facility], and [place] announce the retirement of Arta Silence, M.D., from full-time practice at the [facility] office effective April 24, 2010 and his plans of returning part-time.  It is important to Dr. Hetty Ely and to our practice that you understand that [facility] has seven physicians in our office for your health care needs.  We will continue to offer the same exceptional care that you have today.    Dr. Hetty Ely has spoken to many of you about his plans for retirement and returning part-time in the fall.   We will continue to work with you through the transition to schedule appointments for you in the office and meet the high standards that [facility] is committed to.   Again, it is with great pleasure that we share the news that Dr. Hetty Ely will return to [facility] at [facility] in October of 2011 with a reduced schedule.    If you have any questions, or would like to request an appointment with one of our physicians, please call us at [phone] and press the option for Scheduling an appointment.  We take pleasure in providing you with excellent patient care and look forward to seeing you at your next office visit.  Our [facility] Physicians are:  Tillman Abide, M.D. Laurita Quint, M.D. Roxy Manns, M.D. Kerby Nora, M.D. Hannah Beat, M.D. Ruthe Mannan, M.D. We proudly welcomed Raechel Ache, M.D. and Eustaquio Boyden, M.D. to the practice in July/August 2011.  Sincerely,  [facility]

## 2010-11-25 NOTE — Assessment & Plan Note (Signed)
Summary: cpx   Vital Signs:  Patient Profile:   55 Years Old Female Height:     64.5 inches Weight:      268 pounds Temp:     97.5 degrees F tympanic Pulse rate:   80 / minute Pulse rhythm:   regular BP sitting:   120 / 84  (left arm) Cuff size:   large  Vitals Entered By: Providence Crosby (February 14, 2008 9:24 AM)                 Chief Complaint:  CHECK UP NEEDS MAMMOGRAM LAST ONE AT [facility] 11/10/2006// COLONOSCOPY 04/21/2007.  History of Present Illness: Here formComp Exam, only working 2 days a week and money tight...would like to change Crestor. Has been on other meds, one didn't lower her enough. No prbs, no complaints...had knee replacement and now without pain.    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      Denies discharge, double vision, eye irritation, eye pain, halos, itching, light sensitivity, red eye, vision loss-1 eye, and vision loss-both eyes.  ENT      Denies decreased hearing, difficulty swallowing, ear discharge, earache, hoarseness, nasal congestion, nosebleeds, postnasal drainage, ringing in ears, sinus pressure, and sore throat.  CV       Denies bluish discoloration of lips or nails, chest pain or discomfort, difficulty breathing at night, difficulty breathing while lying down, fainting, fatigue, leg cramps with exertion, lightheadness, near fainting, palpitations, shortness of breath with exertion, swelling of feet, swelling of hands, and weight gain.  Resp      Denies chest discomfort, chest pain with inspiration, cough, coughing up blood, excessive snoring, hypersomnolence, morning headaches, pleuritic, shortness of breath, sputum productive, and wheezing.  GI      Denies abdominal pain, bloody stools, change in bowel habits, constipation, dark tarry stools, diarrhea, excessive appetite, gas, hemorrhoids, indigestion, loss of appetite, nausea, vomiting, vomiting blood, and yellowish skin color.  GU      Denies abnormal vaginal bleeding, decreased libido, discharge, dysuria, genital sores, hematuria, incontinence, nocturia, urinary frequency, and urinary hesitancy.  MS      Denies joint pain, joint redness, joint swelling, loss of strength, low back pain, mid back pain, muscle aches, muscle , cramps, muscle weakness, stiffness, and thoracic pain.  Recent TKR  Derm      Complains of itching and rash.      Denies changes in color of skin, changes in nail beds, dryness, excessive perspiration, flushing, hair loss, insect bite(s), lesion(s), and poor wound healing.      right upper back at braline.  Neuro      Complains of numbness.      Denies brief paralysis, difficulty with concentration, disturbances in coordination, falling down, headaches, inability to speak, memory loss, poor balance, seizures, sensation of room spinning, tingling, tremors, visual disturbances, and weakness.      of fingers at times with sitting.   Physical Exam  General:     Well-developed,well-nourished,in no acute distress; alert,appropriate and cooperative throughout examination, mildly obese. Head:      Normocephalic and atraumatic without obvious abnormalities. No apparent alopecia or balding. Eyes:     Conjunctiva clear bilaterally.  Ears:     External ear exam shows no significant lesions or deformities.  Otoscopic examination reveals clear canals, tympanic membranes are intact bilaterally without bulging, retraction, inflammation or discharge. Hearing is grossly normal bilaterally. Nose:     External nasal examination shows no deformity or inflammation. Nasal mucosa are pink and moist without lesions or exudates. Mouth:     Oral mucosa and oropharynx without lesions or exudates.  Teeth in good repair. Neck:     No deformities, masses, or tenderness noted. Chest Wall:     No deformities, masses, or tenderness noted. Breasts:     No mass, nodules, thickening, tenderness, bulging, retraction, inflamation, nipple discharge or skin changes noted.   Lungs:     Normal respiratory effort, chest expands symmetrically. Lungs are clear to auscultation, no crackles or wheezes. Heart:     Normal rate and regular rhythm. S1 and S2 normal without gallop, murmur, click, rub or other extra sounds. Abdomen:     Bowel sounds positive,abdomen soft and non-tender without masses, organomegaly or hernias noted. Rectal:     No external abnormalities noted. Normal sphincter tone. No rectal masses or tenderness. G neg. Genitalia:     Pelvic Exam:        External: normal female genitalia without lesions or masses        Vagina: normal without lesions or masses        Cervix: normal without lesions or masses        Adnexa: normal bimanual exam without masses or fullness        Uterus: normal by palpation        Pap smear: performed Msk:     No deformity or scoliosis noted of thoracic or lumbar spine.   Pulses:     R and L carotid,radial,femoral,dorsalis pedis and posterior tibial pulses are full and equal bilaterally Extremities:      No clubbing, cyanosis, edema, or deformity noted with normal full range of motion of all joints.   Neurologic:     No cranial nerve deficits noted. Station and gait are normal. Plantar reflexes are down-going bilaterally. DTRs are symmetrical throughout. Sensory, motor and coordinative functions appear intact. Skin:     Intact without suspicious lesions or rashes Cervical Nodes:     No lymphadenopathy noted Axillary Nodes:     No palpable lymphadenopathy Inguinal Nodes:     No significant adenopathy Psych:     Cognition and judgment appear intact. Alert and cooperative with normal attention span and concentration. No apparent delusions, illusions, hallucinations    Impression & Recommendations:  Problem #  1:  HEALTH MAINTENANCE EXAM (ICD-V70.0) Assessment: Deteriorated  Problem # 2:  FUNGAL DERMATITIS (ICD-111.9) Assessment: Unchanged Mildly recurrent. The following medications were removed from the medication list:    Nystatin 100000 Unit/gm Crea (Nystatin) ..... One application to affected area two times a day for at least a week  Her updated medication list for this problem includes:    Lamisil At 1 % Crea (Terbinafine hcl) .[name]... Apply to area bid   Problem # 3:  HYPERGLYCEMIA (ICD-790.6) Assessment: Unchanged Encouraged to avoid sweets and carbs.  Problem # 4:  Hx of HYPERCHOLESTEROLEMIA (ICD-272.0) Assessment: Unchanged Stable but wants to change Crestor . Her updated medication list for this problem includes:    Simvastatin 40 Mg Tabs (Simvastatin) .[name]... 2 tabs by mouth hs  Labs Reviewed: Chol: 149 (02/09/2008)   HDL: 37.7 (02/09/2008)   LDL: 90 (02/09/2008)   TG: 107 (02/09/2008) SGOT: 23 (02/09/2008)   SGPT: 25 (02/09/2008)   Problem # 5:  HYPERTENSION (ICD-401.9) Assessment: Unchanged Stable. Her updated medication list for this problem includes:    Metoprolol Succinate 100 Mg Tb24 (Metoprolol succinate) .[name]... 1 tablet by mouth every night   BP today: 120/84 Prior BP: 130/80 (08/23/2007)  Labs Reviewed: Creat: 0.9 (02/09/2008) Chol: 149 (02/09/2008)   HDL: 37.7 (02/09/2008)   LDL: 90 (02/09/2008)   TG: 107 (02/09/2008)   Problem # 6:  SCREENING FOR MALIGNANNT NEOPLASM, SITE NEC (ICD-V76.49) Assessment: Unchanged Will get Mammo....ordered.Orders: Radiology Referral (Radiology)  Orders: Radiology Referral (Radiology)   Complete Medication List: 1)  Simvastatin 40 Mg Tabs (Simvastatin) .... 2 tabs by mouth hs 2)  Zyrtec 10 Mg Tabs (Cetirizine hcl) .... Take one by mouth as directed prn 3)  Metoprolol Succinate 100 Mg Tb24 (Metoprolol succinate) .[name].. 1 tablet by mouth every night 4)  Tylenol Pm Extra Strength 500-25 Mg Tabs (Diphenhydramine-apap (sleep)) .[name].. 1 hs as needed 5)  Lamisil At 1 % Crea (Terbinafine hcl) .... Apply to area bid   Patient Instructions: 1)  SGOT, SGPT 272. 0   6 WEEKS 2)  See me in 3 mos, SGOT, SGPT, CHOL PROFILE  272. 0    prior  3)  Schedule Mammo.    Prescriptions: SIMVASTATIN 40 MG  TABS (SIMVASTATIN) 2 tabs by mouth hs  #180 x 4   Entered and Authorized by:   Shaune Leeks MD   Signed by:   Shaune Leeks MD on 02/14/2008   Method used:   Print then Give to Patient   RxID:   [id number] SIMVASTATIN 80 MG  TABS (SIMVASTATIN) one tab by mouth at night.  #90 x 4   Entered and Authorized by:   Shaune Leeks MD   Signed by:   Shaune Leeks MD on 02/14/2008   Method used:   Print then Give to Patient   RxID:   [id number] LAMISIL AT 1 %  CREA (TERBINAFINE HCL) apply to area bid  #one tube x 1   Entered by:   Providence Crosby   Authorized by:   Shaune Leeks MD   Signed by:   Providence Crosby on 02/14/2008   Method used:   Print then Give to Patient   RxID:   [id number] METOPROLOL SUCCINATE 100 MG TB24 (METOPROLOL SUCCINATE) 1 tablet by mouth every night  #90 x 3   Entered by:   Providence Crosby   Authorized by:   Shaune Leeks MD    Signed by:   Providence Crosby on 02/14/2008  Method used:   Print then Give to Patient   RxID:   [id number]  ]

## 2010-11-25 NOTE — Progress Notes (Signed)
Summary: ? about suppository  Phone Note From Pharmacy Call back at fax [phone]   Caller: [facility] [phone]* Call For: Dr. Hetty Ely  Summary of Call: Pharmacy sent a fax stating that [facility] does not have an A-B rated generic, they want to know if they can dispense Anucort-HC 25mg  suppository.  Please advise.  Form in your IN box. Initial call taken by: Linde Gillis CMA Duncan Dull),  Feb 26, 2010 4:28 PM  Follow-up for Phone Call        Substitute fine with me. Follow-up by: Shaune Leeks MD,  Feb 26, 2010 4:49 PM  Additional Follow-up for Phone Call Additional follow up Details #1::        Form faxed back to pharmacy with response. Additional Follow-up by: Sydell Axon LPN,  Feb 26, 9810 4:51 PM

## 2010-11-25 NOTE — Letter (Signed)
Summary: Results Follow up Letter  [place] [facility]  [address] [place], [place] [address]   Phone: [phone]  Fax: [phone]    04/14/2010 MRN: [id number]  [facility] [address] [facility] [place], [place]  [address]  Dear Ms. Dobek,  The following are the results of your recent test(s):  Test         Result    Pap Smear:        Normal _____  Not Normal _____ Comments: ______________________________________________________ Cholesterol: LDL(Bad cholesterol):         Your goal is less than:         HDL (Good cholesterol):       Your goal is more than: Comments:  ______________________________________________________ Mammogram:        Normal _X__  Not Normal _____ Comments:Please repeat in one year.  ___________________________________________________________________ Hemoccult:        Normal _____  Not normal _______ Comments:    _____________________________________________________________________ Other Tests:    We routinely do not discuss normal results over the telephone.  If you desire a copy of the results, or you have any questions about this information we can discuss them at your next office visit.   Sincerely,     Laurita Quint, MD

## 2010-11-25 NOTE — Progress Notes (Signed)
Summary: NEED TO KNOW IF LABS NEEDED FOR NEXT OV.[name]  Phone Note Call from Patient   Summary of Call: PT HAS AN APPT COMING UP- 10-23-2008 .Marland KitchenMarland KitchenFOR A 5 MTH F/U--WANT TO KNOW IF LABS ARE NEEDED...NOTHING STATED DURING LAST VISIT...PTS CALL BACK # [phone] OR [phone]---TOLD PT SOMEONE WOULD CALL HER BACK ON NEXT WEEK .Marland KitchenDaine Gip  October 05, 2008 10:05 AM  Initial call taken by: Daine Gip,  October 05, 2008 10:05 AM  Follow-up for Phone Call        A Fasting Glu would be worthwhile. Glu Fasting, 790.6. Follow-up by: Shaune Leeks MD,  October 08, 2008 7:22 AM  Additional Follow-up for Phone Call Additional follow up Details #1::        patient notified and appointment made Additional Follow-up by: Providence Crosby,  October 08, 2008 8:36 AM

## 2010-11-25 NOTE — Assessment & Plan Note (Signed)
Summary: F/U TO CHECK BP / LFW   Vital Signs:  Patient profile:   55 year old female Height:      65 inches Weight:      281 pounds BMI:     46.93 Temp:     98.2 degrees F oral Pulse rate:   60 / minute Pulse rhythm:   regular BP sitting:   154 / 100  (right arm) Cuff size:   large  Vitals Entered By: Providence Crosby LPN (May 21, 2009 9:48 AM) CC: 3 month followup bp states she forgot to take her bp medication this am   History of Present Illness:  Pt here for BP check, forgot to take her medication this AM.  Was increased last time to Metoprolol 100 two times a day which she has  tolerated well. She has no complaints. She works for the [employer] as a Scientist, product/process development and has a game today at [employer] so must get to work. Her tongue which had funny sensation last time is back to normal.  Problems Prior to Update: 1)  Health Maintenance Exam  (ICD-V70.0) 2)  Hx, Family, Digestive Disorder, Colonic Polyp  (ICD-V18.51) 3)  Hyperglycemia  (ICD-790.6) 4)  Hx of Allergy  (ICD-995.3) 5)  Hx of Hypercholesterolemia  (ICD-272.0) 6)  Hypertension  (ICD-401.9) 7)  Screening For Malignannt Neoplasm, Site Nec  (ICD-V76.49)  Medications Prior to Update: 1)  Simvastatin 40 Mg  Tabs (Simvastatin) .... 2 Tabs By Mouth Hs 2)  Zyrtec Allergy 10 Mg Tabs (Cetirizine Hcl) .... One Tab By Mouth Once Daily As Needed Allergies. 3)  Metoprolol Tartrate 100 Mg Tabs (Metoprolol Tartrate) .... One Tab By Mouth Two Times A Day 4)  Tylenol Pm Extra Strength 500-25 Mg  Tabs (Diphenhydramine-Apap (Sleep)) .[name].. 1 Hs As Needed 5)  Lamisil At 1 %  Crea (Terbinafine Hcl) .... Apply To Area Bid 6)  Hycodan .[name].. 1 Tsp At Bedtime As Needed Cough, May Repeat in 4-6 Hrs If Needed--Caution Re Drowsiness 7)  Black Cohosh 160 Mg Caps (Black Cohosh) .[name].. 1 Daily By Mouth  Allergies (verified): 1)  ! Sulfa  Physical Exam   General:  Well-developed,well-nourished,in no acute distress; alert,appropriate and cooperative throughout examination, obese. Head:  Normocephalic and atraumatic without obvious abnormalities. No apparent alopecia or balding. Eyes:  Conjunctiva clear bilaterally.  PERRLA, EOMI Ears:  External ear exam shows no significant lesions or deformities.  Otoscopic examination reveals clear canals, tympanic membranes are intact bilaterally without bulging, retraction, inflammation or discharge. Hearing is grossly normal bilaterally. Nose:  External nasal examination shows no deformity or inflammation. Nasal mucosa are pink and moist without lesions or exudates. Mouth:  Oral mucosa and oropharynx without lesions or exudates.  Teeth in good repair. Neck:  No deformities, masses, or tenderness noted. Chest Wall:  No deformities, masses, or tenderness noted. Lungs:  Normal respiratory effort, chest expands symmetrically. Lungs are clear to auscultation, no crackles or wheezes. Heart:  Normal rate and regular rhythm. S1 and S2 normal without gallop, murmur, click, rub or other extra sounds.   Impression & Recommendations:  Problem # 1:  HYPERTENSION (ICD-401.9) Assessment Improved Better but high acutely this AM because forgot her medication this AM. Will recheck in 3 months. Her updated medication list for this problem includes:    Metoprolol Tartrate 100 Mg Tabs (Metoprolol tartrate) ..... One tab by mouth two times a day  BP today: 154/100 Prior BP: 148/100 (02/19/2009)  Labs Reviewed: K+: 4.3 (02/12/2009) Creat: : 0.9 (  02/12/2009)   Chol: 135 (02/12/2009)   HDL: 29.50 (02/12/2009)   LDL: 79 (02/12/2009)   TG: 132.0 (02/12/2009)  Complete Medication List: 1)  Simvastatin 40 Mg Tabs (Simvastatin) .... 2 tabs by mouth hs 2)  Zyrtec Allergy 10 Mg Tabs (Cetirizine hcl) .... One tab by mouth once daily as needed allergies.  3)  Metoprolol Tartrate 100 Mg Tabs (Metoprolol tartrate) .... One tab by mouth two times a day 4)  Tylenol Pm Extra Strength 500-25 Mg Tabs (Diphenhydramine-apap (sleep)) .[name].. 1 hs as needed 5)  Lamisil At 1 % Crea (Terbinafine hcl) .... Apply to area bid 6)  Hycodan  .[name].. 1 tsp at bedtime as needed cough, may repeat in 4-6 hrs if needed--caution re drowsiness 7)  Black Cohosh 160 Mg Caps (Black cohosh) .[name].. 1 daily by mouth 8)  Lotemax 0.5 % Susp (Loteprednol etabonate) .... 2 dropps both eyes twice a day  Patient Instructions: 1)  RTC 3 mos, recheck BP

## 2010-11-25 NOTE — Letter (Signed)
Summary: Results Follow up Letter  [place] at [facility]  [address] [place], [place] [address]   Phone: [phone]  Fax: [phone]    03/05/2010 MRN: [id number]  [facility] [address] [facility] [place], [place]  [address]  Dear Ms. Troia,  The following are the results of your recent test(s):  Test         Result    Pap Smear:        Normal __X___  Not Normal _____ Comments: Please repeat in one year. ______________________________________________________ Cholesterol: LDL(Bad cholesterol):         Your goal is less than:         HDL (Good cholesterol):       Your goal is more than: Comments:  ______________________________________________________ Mammogram:        Normal _____  Not Normal _____ Comments:  ___________________________________________________________________ Hemoccult:        Normal _____  Not normal _______ Comments:    _____________________________________________________________________ Other Tests:    We routinely do not discuss normal results over the telephone.  If you desire a copy of the results, or you have any questions about this information we can discuss them at your next office visit.   Sincerely,    Laurita Quint, MD

## 2010-11-25 NOTE — Consult Note (Signed)
Summary: Consultation Report  Consultation Report   Imported By: Eleonore Chiquito 07/05/2007 08:10:11  _____________________________________________________________________  External Attachment:    Type:   Image     Comment:   External Document

## 2010-11-25 NOTE — Assessment & Plan Note (Signed)
Summary: 6 week follow up/rbh      Allergies Added: NKDA  Vital Signs:  Patient Profile:   55 Years Old Female Weight:      264 pounds Temp:     99 degrees F oral Pulse rate:   72 / minute BP sitting:   120 / 80  (left arm)  Vitals Entered By: Providence Crosby (Mar 22, 2007 10:26 AM)              `  Chief Complaint:  6 WEEK F/U.  History of Present Illness: Had Arthroscopy earlier this month.  Current Allergies: No known allergies   Past Surgical History:    NSVD x 2    BTL 1978    L Knee Arthroscopy (Dr Kennith Center )  03/04/2007     Review of Systems  The patient denies anorexia, fever, weight loss, vision loss, decreased hearing, hoarseness, chest pain, syncope, dyspnea on exhertion, peripheral edema, prolonged cough, hemoptysis, abdominal pain, melena, hematochezia, severe indigestion/heartburn, hematuria, incontinence, genital sores, muscle weakness, suspicious skin lesions, transient blindness, difficulty walking, depression, unusual weight change, abnormal bleeding, enlarged lymph nodes, angioedema, breast masses, and testicular masses.     Physical Exam  General:     Well-developed,well-nourished,in no acute distress; alert,appropriate and cooperative throughout examination Head:     Normocephalic and atraumatic without obvious abnormalities. No apparent alopecia or balding. Eyes:     Conjunctiva clear bilaterally.  Ears:     External ear exam shows no significant lesions or deformities.  Otoscopic examination reveals clear canals, tympanic membranes are intact bilaterally without bulging, retraction, inflammation or discharge. Hearing is grossly normal bilaterally. Nose:     External nasal examination shows no deformity or inflammation. Nasal mucosa are pink and moist without lesions or exudates. Mouth:     Oral mucosa and oropharynx without lesions or exudates.  Teeth in good repair. Neck:     No deformities, masses, or tenderness noted. Chest Wall:      No deformities, masses, or tenderness noted. Lungs:     Normal respiratory effort, chest expands symmetrically. Lungs are clear to auscultation, no crackles or wheezes. Heart:     Normal rate and regular rhythm. S1 and S2 normal without gallop, murmur, click, rub or other extra sounds.    Impression & Recommendations:  Problem # 1:  HX, FAMILY, DIGESTIVE DISORDER, COLONIC POLYP (ICD-V18.51) Assessment: Comment Only  Orders: Gastroenterology Referral (GI)   Problem # 2:  HYPERTENSION (ICD-401.9) Assessment: Improved  Her updated medication list for this problem includes:    Metoprolol Succinate 50 Mg Tb24 (Metoprolol succinate) .[name]... Take one by mouth daily  BP today: 120/80  Labs Reviewed: Creat: 0.8 (02/01/2007) Chol: 155 (02/01/2007)   HDL: 39.9 (02/01/2007)   LDL: 94 (02/01/2007)   TG: 105 (02/01/2007)    Patient Instructions: 1)  Please schedule a follow-up appointment in 5 months. 2)  Please schedule a follow-up appointment as needed. Schedule colonoscopy for surveillance due to father's polyps.

## 2010-11-25 NOTE — Assessment & Plan Note (Signed)
Summary: eph/jml  Medications Added LOTEMAX 0.5 % SUSP (LOTEPREDNOL ETABONATE) 2 dropps both eyes twice a day as needed ASPIRIN EC 325 MG TBEC (ASPIRIN) Take one tablet by mouth daily NITROSTAT 0.4 MG SUBL (NITROGLYCERIN) 1 tablet under tongue at onset of chest pain; you may repeat every 5 minutes for up to 3 doses.      Allergies Added:   Visit Type:  Follow-up Primary Provider:  Shaune Leeks MD  CC:  no complaints.  History of Present Illness: Tina Delgado presents for post hospital followup.  She presented to [facility] with chest discomfort recently.  She ruled out for myocardial infarction by enzymes.  She was discharged home and set up for an outpatient stress echocardiogram.  This returned with no evidence of ischemia and normal LV function.  Since discharge from the hospital, she denies any further chest pain, shortness of breath.  She denies any syncope, orthopnea, paroxysmal nocturnal dyspnea or edema.  She had lifted some heavy items at her job prior to her pain starting.  She also has had a chronic, nonproductive cough over the last several months.  This all started when she started on lisinopril.  She has seen her primary care provider several times for this and has had her antihistamines adjusted.  Current Medications (verified): 1)  Simvastatin 40 Mg  Tabs (Simvastatin) .... 2 Tabs By Mouth At Bedtime 2)  Zyrtec Allergy 10 Mg Tabs (Cetirizine Hcl) .... One Tab By Mouth Once Daily As Needed Allergies. 3)  Metoprolol Tartrate 100 Mg Tabs (Metoprolol Tartrate) .... One Tab By Mouth Two Times A Day 4)  Tylenol Pm Extra Strength 500-25 Mg  Tabs (Diphenhydramine-Apap (Sleep)) .[name].. 1 Hs As Needed 5)  Lamisil At 1 %  Crea (Terbinafine Hcl) .... Apply To Area Bid 6)  Lotemax 0.5 % Susp (Loteprednol Etabonate) .... 2 Dropps Both Eyes Twice A Day As Needed 7)  Lisinopril 10 Mg Tabs (Lisinopril) .... One Tab By Mouth At Night 8)  Anusol-Hc 25 Mg Supp (Hydrocortisone  Acetate) .... Insert in Rectum Three Times A Day For One Week, Then As Needed. 9)  Anusol-Hc 2.5 % Crea (Hydrocortisone) .... Apply To Rectal Area Three Times A Day As Needed 10)  Patanol 0.1 % Soln (Olopatadine Hcl) .... One Drop Each Eye Two Times A Day As Needed 11)  Claritin 10 Mg Tabs (Loratadine) .... Take One By Mouth Every Am 12)  Aspirin Ec 325 Mg Tbec (Aspirin) .... Take One Tablet By Mouth Daily 13)  Nitrostat 0.4 Mg Subl (Nitroglycerin) .[name].. 1 Tablet Under Tongue At Onset of Chest Pain; You May Repeat Every 5 Minutes For Up To 3 Doses.  Allergies (verified): 1)  ! Sulfa  Past History:  Past Medical History: Hypertension Hyperlipidemia Morbid obesity Elevated glucose- 106 (05/2003) Family hx. of polyps  Family History: Reviewed history from 02/26/2010 and no changes required. Father: A 79  ?valve problem, polyps Fx ankle              Robt and Earnest Conroy Mother A 9  s/p MI in past, HTN, depression, back surgery, paranoia  Brother A 7 Brother A 54 Brother A 43 MI  PTCA@39  Family History of Coronary Artery Disease:   Social History: Marital Status: Married Children: 2 Occupation:  Engineer, petroleum - concessions  Review of Systems Resp:  Complains of cough.  Vital Signs:  Patient profile:   55 year old female Height:      65 inches Weight:  279 pounds BMI:     46.60 Pulse rate:   61 / minute BP sitting:   116 / 76  (right arm) Cuff size:   large  Vitals Entered By: Hardin Negus, RMA (August 25, 2010 11:18 AM)  Physical Exam  General:  Well nourished, well developed, in no acute distress HEENT: normal Neck: no JVD Cardiac:  normal S1, S2; RRR; no murmur Lungs:  clear to auscultation bilaterally, no wheezing, rhonchi or rales Abd: soft, nontender, no hepatomegaly Ext: no clubbing, cyanosis or edema Vascular: no carotid  bruits Skin: warm and dry    EKG  Procedure date:  08/25/2010  Findings:      Normal sinus rhythm with  rate of:  61 normal axis NSSTTW changes   Impression & Recommendations:  Problem # 1:  HYPERTENSION (ICD-401.9) Controlled.  Problem # 2:  CHEST PAIN UNSPECIFIED (ICD-786.50) Probably MSK. ? if cough exacerbated it some. Stress echo ok and no further symptoms. Reassurance. Consider antacid in future if she has increased symptoms of GERD or more chest pain. F/u with Dr. Jens Som as needed.  Problem # 3:  COUGH (ICD-786.2) May be related to ACEI. Considered putting her on ARB, but she has no insurance and our samples are sparse. Rec she f/u with PCP to discuss her cough and try changing to ARB  Problem # 4:  HYPERLIPIDEMIA (ICD-272.4) Followed by PCP.  Problem # 5:  CORONARY ARTERY DISEASE, FAMILY HX (ICD-V17.3) Continue primary prevention with PCP.  Patient Instructions: 1)  Your physician recommends that you schedule a follow-up appointment in: as needed

## 2010-11-25 NOTE — Progress Notes (Signed)
Summary: Substitute for generic Toprol  Medications Added METOPROLOL TARTRATE 50 MG  TABS (METOPROLOL TARTRATE) one tab by mouth two times a day       Phone Note Call from Patient   Caller: Patient Call For: Dr. Hetty Ely Summary of Call: Was recently seen in the office.  She no longer has prescription coverage. You changed her Crestor to Simvastatin and is on generic Toprol but [facility] doesn't cover generic Toprol.  Can you change it to something on their $10/ 90 day list.  Please send electronically to [facility], [address]. Initial call taken by: Delilah Shan,  Feb 24, 2008 2:04 PM    New/Updated Medications: METOPROLOL TARTRATE 50 MG  TABS (METOPROLOL TARTRATE) one tab by mouth two times a day   Prescriptions: METOPROLOL TARTRATE 50 MG  TABS (METOPROLOL TARTRATE) one tab by mouth two times a day  #60 x 12   Entered by:   Cooper Render   Authorized by:   Shaune Leeks MD   Signed by:   Cooper Render on 02/24/2008   Method used:   Electronically sent to ...       [address]       [address]       [place], [place]  [address]       Ph: [phone]       Fax: [phone]   RxID:   [id number] METOPROLOL TARTRATE 50 MG  TABS (METOPROLOL TARTRATE) one tab by mouth two times a day  #60 x 12   Entered and Authorized by:   Shaune Leeks MD   Signed by:   Shaune Leeks MD on 02/24/2008   Method used:   Print then Give to Patient   RxID:   [id number]  Have pt come in for BP check 4 weeks after starting med. Shaune Leeks MD  Feb 24, 2008 3:46 PM    Appended Document: Substitute for generic Toprol pc to pt, given instructions per Dr. Anabel Bene.

## 2010-11-25 NOTE — Assessment & Plan Note (Signed)
Summary: CPX//CYD   Vital Signs:  Patient profile:   55 year old female Weight:      273.50 pounds BMI:     45.68 Temp:     98.8 degrees F oral Pulse rate:   68 / minute Pulse rhythm:   regular BP sitting:   118 / 80  (left arm) Cuff size:   large  Vitals Entered By: Sydell Axon LPN (Feb 26, 1609 8:38 AM) CC: 30 Minute checkup, had a colonoscopy on 06/08 by Dr. Russella Dar   History of Present Illness: Pt here for Comp Exam, no labs as was told up front she didn't need them. Her eyes bother her from allergies. She feels congested as well since the pollen has been out. She also has had a cough since Christmas.  She otherwise feels well with no other complaints.  Preventive Screening-Counseling & Management  Alcohol-Tobacco     Alcohol drinks/day: <1     Alcohol type: liquor/wine     Smoking Status: quit     Year Quit: 1970     Pack years: 1     Passive Smoke Exposure: no  Caffeine-Diet-Exercise     Caffeine use/day: 1     Does Patient Exercise: no  Problems Prior to Update: 1)  Health Maintenance Exam  (ICD-V70.0) 2)  Hx, Family, Digestive Disorder, Colonic Polyp  (ICD-V18.51) 3)  Hyperglycemia  (ICD-790.6) 4)  Hx of Allergy  (ICD-995.3) 5)  Hx of Hypercholesterolemia  (ICD-272.0) 6)  Hypertension  (ICD-401.9) 7)  Screening For Malignannt Neoplasm, Site Nec  (ICD-V76.49)  Medications Prior to Update: 1)  Simvastatin 40 Mg  Tabs (Simvastatin) .... 2 Tabs By Mouth At Bedtime 2)  Zyrtec Allergy 10 Mg Tabs (Cetirizine Hcl) .... One Tab By Mouth Once Daily As Needed Allergies. 3)  Metoprolol Tartrate 100 Mg Tabs (Metoprolol Tartrate) .... One Tab By Mouth Two Times A Day 4)  Tylenol Pm Extra Strength 500-25 Mg  Tabs (Diphenhydramine-Apap (Sleep)) .[name].. 1 Hs As Needed 5)  Lamisil At 1 %  Crea (Terbinafine Hcl) .... Apply To Area Bid 6)  Hycodan .[name].. 1 Tsp At Bedtime As Needed Cough, May Repeat in 4-6 Hrs If Needed--Caution Re Drowsiness 7)  Black Cohosh 160 Mg Caps (Black  Cohosh) .[name].. 1 Daily By Mouth 8)  Lotemax 0.5 % Susp (Loteprednol Etabonate) .... 2 Dropps Both Eyes Twice A Day 9)  Lisinopril 10 Mg Tabs (Lisinopril) .... One Tab By Mouth At Night  Allergies: 1)  ! Sulfa  Past History:  Past Medical History: Last updated: 03/18/2007 Hypertension Elevated glucose- 106 (05/2003) Family hx. 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 Eyes:  Denies blurring, discharge, and eye pain; has allergies with watering. ENT:  Denies decreased hearing, earache, and ringing in ears. CV:  Complains of shortness of breath with exertion; denies chest pain or discomfort, fainting, fatigue, palpitations, swelling of feet, and swelling of hands; mild with overweight. Resp:  Complains of cough; denies wheezing; since Fall. GI:  Complains of bloody stools and constipation; denies abdominal pain, change in bowel habits, dark tarry stools, diarrhea, excessive appetite,  indigestion, loss of appetite, nausea, vomiting, vomiting blood, and yellowish skin color. GU:  Denies dysuria, nocturia, and urinary frequency. MS:  Complains of cramps and stiffness; denies joint pain, low back pain, mid back pain, and muscle weakness; knee stiffness after surgery, occas musc cramping. Derm:  Denies dryness, itching, and rash. Neuro:  Denies numbness, poor balance, tingling, and tremors.  Physical Exam  General:  Well-developed,well-nourished,in no acute distress; alert,appropriate and cooperative throughout examination, obese. Head:  Normocephalic and atraumatic without obvious abnormalities. No apparent alopecia or balding. Sinuses NT. Eyes:  Conjunctiva clear bilaterally.  PERRLA, EOMI Ears:  External ear exam shows no significant lesions or deformities.  Otoscopic examination reveals clear canals, tympanic membranes are intact bilaterally without bulging, retraction, inflammation or discharge. Hearing is grossly normal bilaterally. Nose:  External nasal examination shows no deformity or inflammation. Nasal mucosa are pink and moist without lesions or exudates. Mouth:  Oral mucosa and oropharynx without lesions or exudates.  Teeth in good repair. Neck:  No deformities, masses, or tenderness noted. Chest Wall:  No deformities, masses, or tenderness noted. Breasts:  No mass, nodules, thickening, tenderness, bulging, retraction, inflamation, nipple discharge or skin changes noted.   Lungs:  Normal respiratory effort, chest expands symmetrically. Lungs are clear to auscultation, no crackles or wheezes. Heart:  Normal rate and regular rhythm. S1 and S2 normal without gallop, murmur, click, rub or other extra sounds. Abdomen:  Bowel sounds positive,abdomen soft and non-tender without masses, organomegaly or hernias noted. Protuberant. Rectal:  No external abnormalities noted. Normal sphincter tone. No rectal masses or tenderness. G pos. Distended ext hemm  anteriorally. Genitalia:  Pelvic Exam:        External: normal female genitalia without lesions or masses        Vagina: normal without lesions or masses        Cervix: normal without lesions or masses        Adnexa: normal bimanual exam without masses or fullness        Uterus: normal by palpation        Pap smear: performed Msk:  No deformity or scoliosis noted of thoracic or lumbar spine.   Pulses:  R and L carotid,radial,femoral,dorsalis pedis and posterior tibial pulses are full and equal bilaterally Extremities:  Bilat little finger swelling and mild erythema of PIP and DIPs. R>L. Neurologic:  No cranial nerve deficits noted. Station and gait are normal. Plantar reflexes are down-going bilaterally. DTRs are symmetrical throughout. Sensory, motor and coordinative functions appear intact. Skin:  Intact without suspicious lesions or rashes Cervical Nodes:  no anterior  cervical adenopathy and no posterior cervical adenopathy.   Axillary Nodes:  No palpable lymphadenopathy Inguinal Nodes:  No significant adenopathy Psych:  normally interactive and good eye contact.      Impression & Recommendations:  Problem # 1:  HEALTH MAINTENANCE EXAM (ICD-V70.0) Assessment Comment Only Discussed healthy lifeestyle, weight loss, immun and diet and exercise. Due Mammo. Pap today.  Problem # 2:  HYPERGLYCEMIA (ICD-790.6) Assessment: Unchanged Need to recheck. Get fasting labs.  Problem # 3:  Hx of ALLERGY (ICD-995.3) Assessment: Deteriorated See instructions. Discussed approach with meds and appropriate way to use them.  Problem # 4:  Hx of HYPERCHOLESTEROLEMIA (ICD-272.0) Assessment: Unchanged Needs to recheck. Will get fasting labs. Her updated medication list for this problem includes:    Simvastatin 40 Mg Tabs (Simvastatin) .[name]... 2 tabs by mouth at bedtime  Problem # 5:  HYPERTENSION (ICD-401.9) Assessment: Unchanged Stable. Her updated medication list for this problem includes:     Metoprolol Tartrate 100 Mg Tabs (Metoprolol tartrate) ..... One tab by mouth two times a day    Lisinopril 10 Mg Tabs (Lisinopril) ..... One tab by mouth at night  BP today: 118/80 Prior BP: 118/68 (10/22/2009)  Labs Reviewed: K+: 4.6 (10/15/2009) Creat: : 0.9 (10/15/2009)   Chol: 135 (02/12/2009)   HDL: 29.50 (02/12/2009)   LDL: 79 (02/12/2009)   TG: 132.0 (02/12/2009)  Problem # 6:  HEMORRHOIDS, EXTERNAL (ICD-455.3) Assessment: New Start both anusol in canal and cream on distended ext hemm. Also sitz bath as able. Start med for constipation as well.  Problem # 7:  CONSTIPATION (ICD-564.00) Assessment: Deteriorated  Start Citrucel regularly per instructions. Regular exercise will help.  Discussed dietary fiber measures and increased water intake.   Complete Medication List: 1)  Simvastatin 40 Mg Tabs (Simvastatin) .... 2 tabs by mouth at bedtime 2)  Zyrtec Allergy 10 Mg Tabs (Cetirizine hcl) .... One tab by mouth once daily as needed allergies. 3)  Metoprolol Tartrate 100 Mg Tabs (Metoprolol tartrate) .... One tab by mouth two times a day 4)  Tylenol Pm Extra Strength 500-25 Mg Tabs (Diphenhydramine-apap (sleep)) .[name].. 1 hs as needed 5)  Lamisil At 1 % Crea (Terbinafine hcl) .... Apply to area bid 6)  Black Cohosh 160 Mg Caps (Black cohosh) .[name].. 1 daily by mouth 7)  Lotemax 0.5 % Susp (Loteprednol etabonate) .... 2 dropps both eyes twice a day 8)  Lisinopril 10 Mg Tabs (Lisinopril) .... One tab by mouth at night 9)  Anusol-hc 25 Mg Supp (Hydrocortisone acetate) .... Insert in rectum three times a day for one week, then as needed. 10)  Anusol-hc 2.5 % Crea (Hydrocortisone) .... Apply to rectal area three times a day as needed 11)  Patanol 0.1 % Soln (Olopatadine hcl) .... One drop each eye two times a day as needed  Patient Instructions: 1)  RTC 2 weeks, check cough 2)  PLs schedule fasting labs. Will report via phone tree or discuss in two weeks. 3)  Add Claritin in AM  to  Zyrtec at night. 4)  Use Patanol as needed. 5)  Use Anusol supps and cream as dir. 6)  Sitz baths as often as poss for one week. 7)  Start Citrucel 1 tsp in 8 oz water every AM. 8)  Discuss mammo. Prescriptions: LISINOPRIL 10 MG TABS (LISINOPRIL) one tab by mouth at night  #30 x 12   Entered and Authorized by:   Shaune Leeks MD   Signed by:   Shaune Leeks MD  on 02/26/2010   Method used:   Electronically to        [facility] [phone]* (retail)       [address]       [place], [place]  [address]       Ph: [phone]       Fax: [phone]   RxID:   [id number] METOPROLOL TARTRATE 100 MG TABS (METOPROLOL TARTRATE) one tab by mouth two times a day  #60 x 12   Entered and Authorized by:   Shaune Leeks MD   Signed by:   Shaune Leeks MD on 02/26/2010   Method used:   Electronically to        [facility] [phone]* (retail)       [address]       [place], [place]  [address]       Ph: [phone]       Fax: [phone]   RxID:   [id number] ZYRTEC ALLERGY 10 MG TABS (CETIRIZINE HCL) one tab by mouth once daily as needed allergies.  #30 x 12   Entered and Authorized by:   Shaune Leeks MD   Signed by:   Shaune Leeks MD on 02/26/2010   Method used:   Electronically to        [facility] [phone]* (retail)       [address]       [place], [place]  [address]       Ph: [phone]       Fax: [phone]   RxID:   [id number] SIMVASTATIN 40 MG  TABS (SIMVASTATIN) 2 tabs by mouth at bedtime  #60 x 12   Entered and Authorized by:   Shaune Leeks MD   Signed by:   Shaune Leeks MD on 02/26/2010   Method used:   Electronically to        [facility] [phone]* (retail)       [address]       [place], [place]  [address]       Ph: [phone]       Fax: [phone]   RxID:   [id number] PATANOL 0.1 % SOLN (OLOPATADINE HCL) one drop each eye two times a day as needed  #1 bottle x 0    Entered and Authorized by:   Shaune Leeks MD   Signed by:   Shaune Leeks MD on 02/26/2010   Method used:   Electronically to        [facility] [phone]* (retail)       [address]       [place], [place]  [address]       Ph: [phone]       Fax: [phone]   RxID:   [id number] ANUSOL-HC 2.5 % CREA (HYDROCORTISONE) apply to rectal area three times a day as needed  #1 tube x 6   Entered and Authorized by:   Shaune Leeks MD   Signed by:   Shaune Leeks MD on 02/26/2010   Method used:   Electronically to        [facility] [phone]* (retail)       [address]       [place], [place]  [address]       Ph: [phone]       Fax: [phone]   RxID:   [id number] ANUSOL-HC 25 MG SUPP (HYDROCORTISONE ACETATE) insert in rectum  three times a day for one week, then as needed.  #30 x 6   Entered and Authorized by:   Shaune Leeks MD   Signed by:   Shaune Leeks MD on 02/26/2010   Method used:   Electronically to        [facility] [phone]* (retail)       [address]       [place], [place]  [address]       Ph: [phone]       Fax: [phone]   RxID:   [id number]   Current Allergies (reviewed today): ! SULFA

## 2010-11-25 NOTE — Assessment & Plan Note (Signed)
Summary: 3 M F/U  DLO   Vital Signs:  Patient Profile:   55 Years Old Female Height:     64.5 inches Weight:      273 pounds Temp:     98.6 degrees F oral Pulse rate:   60 / minute Pulse rhythm:   regular BP sitting:   130 / 86  (left arm) Cuff size:   large  Vitals Entered By: Providence Crosby (May 17, 2008 9:06 AM)                 Chief Complaint:  3 month followup.  History of Present Illness: Here for followup of chol after changing Crestor to Simvastatin 80mg  for financial reasons. She is tolerating the medication well and has no complaints except dry irritated eyes for which her eye dr has prescribed Patanol which she uses infrequently. We discussed use and risk. No other problems.    Prior Medications Reviewed Using: Patient Recall  Current Allergies: No known allergies       Physical Exam  General:     Well-developed,well-nourished,in no acute distress; alert,appropriate and cooperative throughout examination Head:     Normocephalic and atraumatic without obvious abnormalities. No apparent alopecia or balding. Eyes:     Conjunctiva mildly irritated, bulbar > palpebral  bilaterally.  Ears:     External ear exam shows no significant lesions or deformities.  Otoscopic examination reveals clear canals, tympanic membranes are intact bilaterally without bulging, retraction, inflammation or discharge. Hearing is grossly normal bilaterally. Nose:     External nasal examination shows no deformity or inflammation. Nasal mucosa are pink and moist without lesions or exudates. Mouth:     Oral mucosa and oropharynx without lesions or exudates.  Teeth in good repair. Neck:     No deformities, masses, or tenderness noted. Chest Wall:     No deformities, masses, or tenderness noted. Lungs:     Normal respiratory effort, chest expands symmetrically. Lungs are clear to auscultation, no crackles or wheezes. Heart:      Normal rate and regular rhythm. S1 and S2 normal without gallop, murmur, click, rub or other extra sounds.    Impression & Recommendations:  Problem # 1:  Hx of HYPERCHOLESTEROLEMIA (ICD-272.0) Assessment: Unchanged Acceptable on current med. Discussed slightly increased risk with higher LDL and lower HDL, eventho not big changes...the combination ismore than it appears. Her updated medication list for this problem includes:    Simvastatin 40 Mg Tabs (Simvastatin) .[name]... 2 tabs by mouth hs  Labs Reviewed: Chol: 154 (05/11/2008)   HDL: 33.1 (05/11/2008)   LDL: 99 (05/11/2008)   TG: 112 (05/11/2008) SGOT: 29 (05/11/2008)   SGPT: 34 (05/11/2008)   Problem # 2:  HYPERGLYCEMIA (ICD-790.6) Assessment: Unchanged Encouraged to watch sweets and carbs which keeps Trig down as well.  Problem # 3:  FUNGAL DERMATITIS (ICD-111.9) Assessment: Improved  Her updated medication list for this problem includes:    Lamisil At 1 % Crea (Terbinafine hcl) .[name]... Apply to area bid Take medication as directed for full duration.   Problem # 4:  HYPERTENSION (ICD-401.9) Assessment: Unchanged Stable. Her updated medication list for this problem includes:    Metoprolol Tartrate 50 Mg Tabs (Metoprolol tartrate) ..... One tab by mouth two times a day  BP today: 130/86 Prior BP: 120/84 (02/14/2008)  Labs Reviewed: Creat: 0.9 (02/09/2008) Chol: 154 (05/11/2008)   HDL: 33.1 (05/11/2008)   LDL: 99 (05/11/2008)   TG: 112 (05/11/2008)   Complete Medication List: 1)  Simvastatin  40 Mg Tabs (Simvastatin) .... 2 tabs by mouth hs 2)  Zyrtec 10 Mg Tabs (Cetirizine hcl) .... Take one by mouth as directed prn 3)  Metoprolol Tartrate 50 Mg Tabs (Metoprolol tartrate) .... One tab by mouth two times a day 4)  Tylenol Pm Extra Strength 500-25 Mg Tabs (Diphenhydramine-apap (sleep)) .[name].. 1 hs as needed 5)  Lamisil At 1 % Crea (Terbinafine hcl) .... Apply to area bid   Patient Instructions: 1)  RTC 5 mos   ]

## 2010-11-25 NOTE — Assessment & Plan Note (Signed)
Summary: ROA FOR 2 WEEK FOLLOW-UP/JRR   Vital Signs:  Patient profile:   55 year old female Weight:      280 pounds Temp:     98.5 degrees F oral Pulse rate:   76 / minute Pulse rhythm:   regular BP sitting:   118 / 78  (left arm) Cuff size:   large  Vitals Entered By: Sydell Axon LPN (Mar 18, 2010 8:56 AM) CC: 2 Week follow-up on allergies and hemorrhoids   History of Present Illness: Pt here for followup of allergies, discuss labs and schedula Mammo which was missed last year. She alsom had hemms that were symptomatic last visit that are improved usin Anusol and sitz baths. She has started Citrucel and BMs are already slightly softer. We discussed them becoming very easy to pass and  more regular as well.  The allergy sxs are much better with eyeirritation, sneezing and cough all better, almost resolved. We discussed starting therapy earlier next year to avoid the acute irritation sxs.  Problems Prior to Update: 1)  Constipation  (ICD-564.00) 2)  Hemorrhoids, External  (ICD-455.3) 3)  Health Maintenance Exam  (ICD-V70.0) 4)  Hx, Family, Digestive Disorder, Colonic Polyp  (ICD-V18.51) 5)  Hyperglycemia  (ICD-790.6) 6)  Hx of Allergy  (ICD-995.3) 7)  Hx of Hypercholesterolemia  (ICD-272.0) 8)  Hypertension  (ICD-401.9) 9)  Screening For Malignannt Neoplasm, Site Nec  (ICD-V76.49)  Medications Prior to Update: 1)  Simvastatin 40 Mg  Tabs (Simvastatin) .... 2 Tabs By Mouth At Bedtime 2)  Zyrtec Allergy 10 Mg Tabs (Cetirizine Hcl) .... One Tab By Mouth Once Daily As Needed Allergies. 3)  Metoprolol Tartrate 100 Mg Tabs (Metoprolol Tartrate) .... One Tab By Mouth Two Times A Day 4)  Tylenol Pm Extra Strength 500-25 Mg  Tabs (Diphenhydramine-Apap (Sleep)) .[name].. 1 Hs As Needed 5)  Lamisil At 1 %  Crea (Terbinafine Hcl) .... Apply To Area Bid 6)  Black Cohosh 160 Mg Caps (Black Cohosh) .[name].. 1 Daily By Mouth 7)  Lotemax 0.5 % Susp (Loteprednol Etabonate) .... 2 Dropps Both Eyes Twice A  Day 8)  Lisinopril 10 Mg Tabs (Lisinopril) .... One Tab By Mouth At Night 9)  Anusol-Hc 25 Mg Supp (Hydrocortisone Acetate) .... Insert in Rectum Three Times A Day For One Week, Then As Needed. 10)  Anusol-Hc 2.5 % Crea (Hydrocortisone) .... Apply To Rectal Area Three Times A Day As Needed 11)  Patanol 0.1 % Soln (Olopatadine Hcl) .... One Drop Each Eye Two Times A Day As Needed  Allergies: 1)  ! Sulfa  Physical Exam  General:  Well-developed,well-nourished,in no acute distress; alert,appropriate and cooperative throughout examination, obese. Head:  Normocephalic and atraumatic without obvious abnormalities. No apparent alopecia or balding. Sinuses NT. Eyes:  Conjunctiva clear bilaterally.  PERRLA, EOMI Ears:  External ear exam shows no significant lesions or deformities.  Otoscopic examination reveals clear canals, tympanic membranes are intact bilaterally without bulging, retraction, inflammation or discharge. Hearing is grossly normal bilaterally. Nose:  External nasal examination shows no deformity or inflammation. Nasal mucosa are pink and moist without lesions or exudates. Mouth:  Oral mucosa and oropharynx without lesions or exudates.  Teeth in good repair. Neck:  No deformities, masses, or tenderness noted. Lungs:  Normal respiratory effort, chest expands symmetrically. Lungs are clear to auscultation, no crackles or wheezes. Heart:  Normal rate and regular rhythm. S1 and S2 normal without gallop, murmur, click, rub or other extra sounds.   Impression & Recommendations:  Problem #  1:  OTHER SCREENING MAMMOGRAM (ICD-V76.12) Assessment New Referred. Orders: Radiology Referral (Radiology)  Problem # 2:  HEMORRHOIDS, EXTERNAL (ICD-455.3) Assessment: Improved Cont Sitz baths and Anusol as needed.  Problem # 3:  CONSTIPATION (ICD-564.00) Assessment: Improved  Cont Citrucel indefinitely.  Discussed dietary fiber measures and increased water intake.   Problem # 4:  Hx of  ALLERGY (ICD-995.3) Assessment: Improved Sxs controlled, almost totally resolved.  Complete Medication List: 1)  Simvastatin 40 Mg Tabs (Simvastatin) .... 2 tabs by mouth at bedtime 2)  Zyrtec Allergy 10 Mg Tabs (Cetirizine hcl) .... One tab by mouth once daily as needed allergies. 3)  Metoprolol Tartrate 100 Mg Tabs (Metoprolol tartrate) .... One tab by mouth two times a day 4)  Tylenol Pm Extra Strength 500-25 Mg Tabs (Diphenhydramine-apap (sleep)) .[name].. 1 hs as needed 5)  Lamisil At 1 % Crea (Terbinafine hcl) .... Apply to area bid 6)  Black Cohosh 160 Mg Caps (Black cohosh) .[name].. 1 daily by mouth 7)  Lotemax 0.5 % Susp (Loteprednol etabonate) .... 2 dropps both eyes twice a day 8)  Lisinopril 10 Mg Tabs (Lisinopril) .... One tab by mouth at night 9)  Anusol-hc 25 Mg Supp (Hydrocortisone acetate) .... Insert in rectum three times a day for one week, then as needed. 10)  Anusol-hc 2.5 % Crea (Hydrocortisone) .... Apply to rectal area three times a day as needed 11)  Patanol 0.1 % Soln (Olopatadine hcl) .... One drop each eye two times a day as needed 12)  Claritin 10 Mg Tabs (Loratadine) .... Take one by mouth every am  Patient Instructions: 1)  Refer for Mammo. 2)  RTC as needed.  Current Allergies (reviewed today): ! SULFA

## 2010-11-25 NOTE — Assessment & Plan Note (Signed)
Summary: NURSE VISIT-CHECK BLOOD PRESSURE CYD   Nurse Visit   Vital Signs:  Patient profile:   55 year old female BP sitting:   118 / 68  (left arm) Cuff size:   large  Vitals Entered By: Delilah Shan CMA (AAMA) (October 22, 2009 8:55 AM) CC: BP check - Nurse Visit   Allergies: 1)  ! Sulfa

## 2010-11-25 NOTE — Letter (Signed)
Summary: Results Follow up Letter  [place] at [facility]  [address] [place], [place] [address]   Phone: [phone]  Fax: [phone]    02/21/2009 MRN: [id number]  [facility] [address] [facility] [place], [place]  [address]  Dear Ms. Vanvoorhis,  The following are the results of your recent test(s):  Test         Result    Pap Smear:        Normal __x___  Not Normal _____ Comments:   Your pap smear was normal. Please make sure to repeat in one year with a physical. Enclosed is a copy of your report. ______________________________________________________ Cholesterol: LDL(Bad cholesterol):         Your goal is less than:         HDL (Good cholesterol):       Your goal is more than: Comments:  ______________________________________________________ Mammogram:        Normal _____  Not Normal _____ Comments:  ___________________________________________________________________ Hemoccult:        Normal _____  Not normal _______ Comments:    _____________________________________________________________________ Other Tests:    We routinely do not discuss normal results over the telephone.  If you desire a copy of the results, or you have any questions about this information we can discuss them at your next office visit.   Sincerely,

## 2010-11-25 NOTE — Letter (Signed)
Summary: Results Follow up Letter  [place] at [facility]  [address] [place], [place] [address]   Phone: [phone]  Fax: [phone]    02/23/2008 MRN: [id number]  [facility] [address] [facility] [place], [place]  [address]  Dear Tina Delgado,  The following are the results of your recent test(s):  Test         Result    Pap Smear:        Normal _____  Not Normal _____ Comments: ______________________________________________________ Cholesterol: LDL(Bad cholesterol):         Your goal is less than:         HDL (Good cholesterol):       Your goal is more than: Comments:  ______________________________________________________ Mammogram:        Normal __X___  Not Normal _____ Comments:  YOUR MAMMOGRAM REPORT WAS OKAY. PLEASE MAKE SURE TO REPEAT IN ONE YEAR. ENCLOSED IS A COPY OF YOUR REPORT.  ___________________________________________________________________ Hemoccult:        Normal _____  Not normal _______ Comments:    _____________________________________________________________________ Other Tests:    We routinely do not discuss normal results over the telephone.  If you desire a copy of the results, or you have any questions about this information we can discuss them at your next office visit.   Sincerely,

## 2011-01-08 LAB — TROPONIN I: Troponin I: 0.02 ng/mL (ref 0.00–0.06)

## 2011-01-08 LAB — URINALYSIS, ROUTINE W REFLEX MICROSCOPIC
Bilirubin Urine: NEGATIVE
Glucose, UA: NEGATIVE mg/dL
Hgb urine dipstick: NEGATIVE
Ketones, ur: NEGATIVE mg/dL
Nitrite: NEGATIVE
Protein, ur: NEGATIVE mg/dL
Specific Gravity, Urine: 1.011 (ref 1.005–1.030)
Urobilinogen, UA: 0.2 mg/dL (ref 0.0–1.0)
pH: 5.5 (ref 5.0–8.0)

## 2011-01-08 LAB — CBC
HCT: 43.7 % (ref 36.0–46.0)
HCT: 44.7 % (ref 36.0–46.0)
Hemoglobin: 14.8 g/dL (ref 12.0–15.0)
Hemoglobin: 15.1 g/dL — ABNORMAL HIGH (ref 12.0–15.0)
MCH: 30.6 pg (ref 26.0–34.0)
MCH: 30.8 pg (ref 26.0–34.0)
MCHC: 33.8 g/dL (ref 30.0–36.0)
MCHC: 33.9 g/dL (ref 30.0–36.0)
MCV: 90.7 fL (ref 78.0–100.0)
MCV: 90.9 fL (ref 78.0–100.0)
Platelets: 197 10*3/uL (ref 150–400)
Platelets: 234 10*3/uL (ref 150–400)
RBC: 4.81 MIL/uL (ref 3.87–5.11)
RBC: 4.93 MIL/uL (ref 3.87–5.11)
RDW: 12.8 % (ref 11.5–15.5)
RDW: 12.9 % (ref 11.5–15.5)
WBC: 11.5 10*3/uL — ABNORMAL HIGH (ref 4.0–10.5)
WBC: 9.2 10*3/uL (ref 4.0–10.5)

## 2011-01-08 LAB — CARDIAC PANEL(CRET KIN+CKTOT+MB+TROPI)
CK, MB: 1.3 ng/mL (ref 0.3–4.0)
CK, MB: 1.3 ng/mL (ref 0.3–4.0)
Relative Index: INVALID (ref 0.0–2.5)
Relative Index: INVALID (ref 0.0–2.5)
Total CK: 64 U/L (ref 7–177)
Total CK: 68 U/L (ref 7–177)
Troponin I: 0.01 ng/mL (ref 0.00–0.06)
Troponin I: 0.01 ng/mL (ref 0.00–0.06)

## 2011-01-08 LAB — BASIC METABOLIC PANEL
BUN: 12 mg/dL (ref 6–23)
CO2: 30 mEq/L (ref 19–32)
Calcium: 9.2 mg/dL (ref 8.4–10.5)
Chloride: 101 mEq/L (ref 96–112)
Creatinine, Ser: 0.83 mg/dL (ref 0.4–1.2)
GFR calc Af Amer: >60 mL/min (ref >60)
GFR calc non Af Amer: >60 mL/min (ref >60)
Glucose, Bld: 136 mg/dL — ABNORMAL HIGH (ref 70–99)
Potassium: 3.9 mEq/L (ref 3.5–5.1)
Sodium: 137 mEq/L (ref 135–145)

## 2011-01-08 LAB — COMPREHENSIVE METABOLIC PANEL
ALT: 25 U/L (ref 0–35)
AST: 22 U/L (ref 0–37)
Albumin: 3.4 g/dL — ABNORMAL LOW (ref 3.5–5.2)
Alkaline Phosphatase: 74 U/L (ref 39–117)
BUN: 6 mg/dL (ref 6–23)
CO2: 30 mEq/L (ref 19–32)
Calcium: 9.2 mg/dL (ref 8.4–10.5)
Chloride: 103 mEq/L (ref 96–112)
Creatinine, Ser: 0.82 mg/dL (ref 0.4–1.2)
GFR calc Af Amer: >60 mL/min (ref >60)
GFR calc non Af Amer: >60 mL/min (ref >60)
Glucose, Bld: 109 mg/dL — ABNORMAL HIGH (ref 70–99)
Potassium: 4.4 mEq/L (ref 3.5–5.1)
Sodium: 140 mEq/L (ref 135–145)
Total Bilirubin: 0.6 mg/dL (ref 0.3–1.2)
Total Protein: 7 g/dL (ref 6.0–8.3)

## 2011-01-08 LAB — TSH: TSH: 1.573 u[IU]/mL (ref 0.350–4.500)

## 2011-01-08 LAB — BRAIN NATRIURETIC PEPTIDE: Pro B Natriuretic peptide (BNP): 53 pg/mL (ref 0.0–100.0)

## 2011-01-08 LAB — DRUGS OF ABUSE SCREEN W/O ALC, ROUTINE URINE
Amphetamine Screen, Ur: NEGATIVE
Barbiturate Quant, Ur: NEGATIVE
Benzodiazepines.: NEGATIVE
Cocaine Metabolites: NEGATIVE
Creatinine,U: 79.9 mg/dL
Marijuana Metabolite: NEGATIVE
Methadone: NEGATIVE
Opiate Screen, Urine: NEGATIVE
Phencyclidine (PCP): NEGATIVE
Propoxyphene: NEGATIVE

## 2011-01-08 LAB — DIFFERENTIAL
Basophils Absolute: 0.1 10*3/uL (ref 0.0–0.1)
Basophils Relative: 0 % (ref 0–1)
Eosinophils Absolute: 0.7 10*3/uL (ref 0.0–0.7)
Eosinophils Relative: 6 % — ABNORMAL HIGH (ref 0–5)
Lymphocytes Relative: 28 % (ref 12–46)
Lymphs Abs: 3.2 10*3/uL (ref 0.7–4.0)
Monocytes Absolute: 0.8 10*3/uL (ref 0.1–1.0)
Monocytes Relative: 7 % (ref 3–12)
Neutro Abs: 6.8 10*3/uL (ref 1.7–7.7)
Neutrophils Relative %: 59 % (ref 43–77)

## 2011-01-08 LAB — LIPID PANEL
Cholesterol: 153 mg/dL (ref 0–200)
HDL: 43 mg/dL (ref >39)
LDL Cholesterol: 85 mg/dL (ref 0–99)
Total CHOL/HDL Ratio: 3.6 RATIO
Triglycerides: 125 mg/dL (ref <150)
VLDL: 25 mg/dL (ref 0–40)

## 2011-01-08 LAB — POCT CARDIAC MARKERS
CKMB, poc: <1 ng/mL — ABNORMAL LOW (ref 1.0–8.0)
CKMB, poc: <1 ng/mL — ABNORMAL LOW (ref 1.0–8.0)
Myoglobin, poc: 34.2 ng/mL (ref 12–200)
Myoglobin, poc: 35.9 ng/mL (ref 12–200)
Troponin i, poc: <0.05 ng/mL (ref 0.00–0.09)
Troponin i, poc: <0.05 ng/mL (ref 0.00–0.09)

## 2011-01-08 LAB — CK TOTAL AND CKMB (NOT AT ARMC)
CK, MB: 1.5 ng/mL (ref 0.3–4.0)
Relative Index: INVALID (ref 0.0–2.5)
Total CK: 58 U/L (ref 7–177)

## 2011-01-08 LAB — PROTIME-INR
INR: 0.95 (ref 0.00–1.49)
Prothrombin Time: 12.9 seconds (ref 11.6–15.2)

## 2011-01-08 LAB — APTT: aPTT: 25 seconds (ref 24–37)

## 2011-01-08 LAB — D-DIMER, QUANTITATIVE: D-Dimer, Quant: 0.27 ug/mL-FEU (ref 0.00–0.48)

## 2011-01-08 LAB — D-DIMER, QUANTITATIVE (NOT AT ARMC): D-Dimer, Quant: <0.22 ug/mL-FEU (ref 0.00–0.48)

## 2011-03-17 ENCOUNTER — Other Ambulatory Visit: Payer: Self-pay | Admitting: Family Medicine

## 2011-03-25 ENCOUNTER — Other Ambulatory Visit: Payer: Self-pay | Admitting: Family Medicine

## 2011-03-25 DIAGNOSIS — E7800 Pure hypercholesterolemia, unspecified: Secondary | ICD-10-CM

## 2011-03-26 ENCOUNTER — Encounter: Payer: Self-pay | Admitting: Family Medicine

## 2011-03-26 ENCOUNTER — Other Ambulatory Visit (INDEPENDENT_AMBULATORY_CARE_PROVIDER_SITE_OTHER): Payer: BC Managed Care – PPO | Admitting: Family Medicine

## 2011-03-26 DIAGNOSIS — E7800 Pure hypercholesterolemia, unspecified: Secondary | ICD-10-CM

## 2011-03-26 LAB — COMPREHENSIVE METABOLIC PANEL
ALT: 28 U/L (ref 0–35)
AST: 21 U/L (ref 0–37)
Albumin: 3.6 g/dL (ref 3.5–5.2)
Alkaline Phosphatase: 75 U/L (ref 39–117)
BUN: 11 mg/dL (ref 6–23)
CO2: 33 mEq/L — ABNORMAL HIGH (ref 19–32)
Calcium: 9.2 mg/dL (ref 8.4–10.5)
Chloride: 104 mEq/L (ref 96–112)
Creatinine, Ser: 0.8 mg/dL (ref 0.4–1.2)
GFR: 80.99 mL/min (ref >60.00)
Glucose, Bld: 116 mg/dL — ABNORMAL HIGH (ref 70–99)
Potassium: 4.3 mEq/L (ref 3.5–5.1)
Sodium: 141 mEq/L (ref 135–145)
Total Bilirubin: 0.6 mg/dL (ref 0.3–1.2)
Total Protein: 6.8 g/dL (ref 6.0–8.3)

## 2011-03-26 LAB — LIPID PANEL
Cholesterol: 162 mg/dL (ref 0–200)
HDL: 39.1 mg/dL (ref >39.00)
LDL Cholesterol: 91 mg/dL (ref 0–99)
Total CHOL/HDL Ratio: 4
Triglycerides: 159 mg/dL — ABNORMAL HIGH (ref 0.0–149.0)
VLDL: 31.8 mg/dL (ref 0.0–40.0)

## 2011-03-31 ENCOUNTER — Encounter: Payer: Self-pay | Admitting: Family Medicine

## 2011-03-31 ENCOUNTER — Ambulatory Visit (INDEPENDENT_AMBULATORY_CARE_PROVIDER_SITE_OTHER): Payer: BC Managed Care – PPO | Admitting: Family Medicine

## 2011-03-31 DIAGNOSIS — I10 Essential (primary) hypertension: Secondary | ICD-10-CM

## 2011-03-31 DIAGNOSIS — R7989 Other specified abnormal findings of blood chemistry: Secondary | ICD-10-CM

## 2011-03-31 DIAGNOSIS — Z0000 Encounter for general adult medical examination without abnormal findings: Secondary | ICD-10-CM

## 2011-03-31 DIAGNOSIS — E785 Hyperlipidemia, unspecified: Secondary | ICD-10-CM

## 2011-03-31 MED ORDER — METOPROLOL TARTRATE 100 MG PO TABS: 100.0000 mg | ORAL_TABLET | Freq: Two times a day (BID) | ORAL | Status: DC

## 2011-03-31 MED ORDER — SIMVASTATIN 40 MG PO TABS: 40.0000 mg | ORAL_TABLET | Freq: Every day | ORAL | Status: DC

## 2011-03-31 NOTE — Assessment & Plan Note (Addendum)
She'll call about mammogram.  Flu shot encouraged.  D/w pt [id number].  Up to date on mammogram and tetanus.  No indication for pap.

## 2011-03-31 NOTE — Progress Notes (Signed)
CPE- See plan.  Routine anticipatory guidance given to patient.  See health maintenance.  Hypertension:    Using medication without problems or lightheadedness: yes Chest pain with exertion:ni Edema:no Short of breath: occ, likely due to deconditioning Average home BPs: normally controlled, similar to today  Elevated Cholesterol: Using medications without problems:yes Muscle aches: no Other complaints:no  Elevated glucose.  Labs d/w pt.  She is working on [employer] at work.    PMH and SH reviewed  Meds, vitals, and allergies reviewed.   ROS: See HPI.  Otherwise negative.    GEN: nad, alert and oriented HEENT: mucous membranes moist NECK: supple w/o LA CV: rrr. PULM: ctab, no inc wob ABD: soft, +bs EXT: no edema SKIN: no acute rash Breast exam: No mass, nodules, thickening, tenderness, bulging, retraction, inflamation, nipple discharge or skin changes noted.  No axillary or clavicular LA.  Chaperoned exam.

## 2011-03-31 NOTE — Assessment & Plan Note (Signed)
No change in meds.  D/w pt about weight/exercise.   

## 2011-03-31 NOTE — Assessment & Plan Note (Signed)
No change in meds.  D/w pt about weight/exercise.

## 2011-03-31 NOTE — Assessment & Plan Note (Addendum)
Dec to 40mg  simva, d/w pt.  D/w pt about weight/exercise.

## 2011-05-28 ENCOUNTER — Ambulatory Visit: Payer: Self-pay | Admitting: Family Medicine

## 2011-05-29 ENCOUNTER — Encounter: Payer: Self-pay | Admitting: Family Medicine

## 2011-06-01 ENCOUNTER — Encounter: Payer: Self-pay | Admitting: *Deleted

## 2012-01-20 ENCOUNTER — Encounter: Payer: Self-pay | Admitting: Gastroenterology

## 2012-04-12 ENCOUNTER — Telehealth: Payer: Self-pay | Admitting: Family Medicine

## 2012-04-12 DIAGNOSIS — Z1231 Encounter for screening mammogram for malignant neoplasm of breast: Secondary | ICD-10-CM

## 2012-04-12 NOTE — Telephone Encounter (Signed)
Patient requesting we schedule her annual mammogram with [place] facility.  She can make an appointment any evening after 5:30 p.m.Tina Delgado  She has her physical in August and would like to schedule it before her physical.

## 2012-04-12 NOTE — Telephone Encounter (Signed)
Ordered, please help patient with this.

## 2012-04-13 NOTE — Telephone Encounter (Signed)
Left message on machine to call back  

## 2012-05-02 ENCOUNTER — Other Ambulatory Visit: Payer: Self-pay | Admitting: *Deleted

## 2012-05-02 MED ORDER — SIMVASTATIN 40 MG PO TABS: 40.0000 mg | ORAL_TABLET | Freq: Every day | ORAL | Status: DC

## 2012-05-02 MED ORDER — METOPROLOL TARTRATE 100 MG PO TABS: 100.0000 mg | ORAL_TABLET | Freq: Two times a day (BID) | ORAL | Status: DC

## 2012-05-02 NOTE — Telephone Encounter (Signed)
Sent!

## 2012-05-02 NOTE — Telephone Encounter (Signed)
Received faxed refill request from pharmacy. Patient has a physical scheduled for 06/10/12. Is it okay to refill medication?

## 2012-05-25 ENCOUNTER — Other Ambulatory Visit: Payer: Self-pay | Admitting: Family Medicine

## 2012-05-25 DIAGNOSIS — I10 Essential (primary) hypertension: Secondary | ICD-10-CM

## 2012-05-31 ENCOUNTER — Ambulatory Visit: Payer: Self-pay | Admitting: Family Medicine

## 2012-05-31 ENCOUNTER — Other Ambulatory Visit (INDEPENDENT_AMBULATORY_CARE_PROVIDER_SITE_OTHER): Payer: BC Managed Care – PPO

## 2012-05-31 DIAGNOSIS — I10 Essential (primary) hypertension: Secondary | ICD-10-CM

## 2012-05-31 LAB — COMPREHENSIVE METABOLIC PANEL
ALT: 22 U/L (ref 0–35)
AST: 18 U/L (ref 0–37)
Albumin: 4 g/dL (ref 3.5–5.2)
Alkaline Phosphatase: 67 U/L (ref 39–117)
BUN: 18 mg/dL (ref 6–23)
CO2: 33 mEq/L — ABNORMAL HIGH (ref 19–32)
Calcium: 9.3 mg/dL (ref 8.4–10.5)
Chloride: 99 mEq/L (ref 96–112)
Creatinine, Ser: 0.9 mg/dL (ref 0.4–1.2)
GFR: 72.06 mL/min (ref >60.00)
Glucose, Bld: 107 mg/dL — ABNORMAL HIGH (ref 70–99)
Potassium: 4.4 mEq/L (ref 3.5–5.1)
Sodium: 138 mEq/L (ref 135–145)
Total Bilirubin: 0.7 mg/dL (ref 0.3–1.2)
Total Protein: 7.3 g/dL (ref 6.0–8.3)

## 2012-05-31 LAB — LIPID PANEL
Cholesterol: 176 mg/dL (ref 0–200)
HDL: 46 mg/dL (ref >39.00)
LDL Cholesterol: 107 mg/dL — ABNORMAL HIGH (ref 0–99)
Total CHOL/HDL Ratio: 4
Triglycerides: 116 mg/dL (ref 0.0–149.0)
VLDL: 23.2 mg/dL (ref 0.0–40.0)

## 2012-06-01 ENCOUNTER — Encounter: Payer: Self-pay | Admitting: Family Medicine

## 2012-06-01 ENCOUNTER — Ambulatory Visit: Payer: Self-pay | Admitting: Family Medicine

## 2012-06-02 ENCOUNTER — Encounter: Payer: Self-pay | Admitting: Family Medicine

## 2012-06-02 ENCOUNTER — Other Ambulatory Visit: Payer: Self-pay | Admitting: Family Medicine

## 2012-06-02 DIAGNOSIS — R928 Other abnormal and inconclusive findings on diagnostic imaging of breast: Secondary | ICD-10-CM

## 2012-06-03 ENCOUNTER — Telehealth: Payer: Self-pay

## 2012-06-03 NOTE — Telephone Encounter (Signed)
Christie with [facility] request call back that pt has been set up with surgeon; faxed abnormal report to Dr Para March on 06/02/12.Please advise.

## 2012-06-07 NOTE — Telephone Encounter (Signed)
LMOVM

## 2012-06-10 ENCOUNTER — Encounter: Payer: Self-pay | Admitting: Family Medicine

## 2012-06-10 ENCOUNTER — Ambulatory Visit (INDEPENDENT_AMBULATORY_CARE_PROVIDER_SITE_OTHER): Payer: BC Managed Care – PPO | Admitting: Family Medicine

## 2012-06-10 VITALS — BP 122/84 | HR 68 | Temp 98.6°F | Wt 282.8 lb

## 2012-06-10 DIAGNOSIS — R252 Cramp and spasm: Secondary | ICD-10-CM | POA: Insufficient documentation

## 2012-06-10 DIAGNOSIS — R928 Other abnormal and inconclusive findings on diagnostic imaging of breast: Secondary | ICD-10-CM | POA: Insufficient documentation

## 2012-06-10 DIAGNOSIS — E785 Hyperlipidemia, unspecified: Secondary | ICD-10-CM

## 2012-06-10 DIAGNOSIS — Z23 Encounter for immunization: Secondary | ICD-10-CM

## 2012-06-10 DIAGNOSIS — R7989 Other specified abnormal findings of blood chemistry: Secondary | ICD-10-CM

## 2012-06-10 DIAGNOSIS — I10 Essential (primary) hypertension: Secondary | ICD-10-CM

## 2012-06-10 DIAGNOSIS — M25569 Pain in unspecified knee: Secondary | ICD-10-CM | POA: Insufficient documentation

## 2012-06-10 DIAGNOSIS — Z0000 Encounter for general adult medical examination without abnormal findings: Secondary | ICD-10-CM

## 2012-06-10 MED ORDER — METOPROLOL TARTRATE 100 MG PO TABS: 100.0000 mg | ORAL_TABLET | Freq: Two times a day (BID) | ORAL | Status: DC

## 2012-06-10 MED ORDER — CYCLOBENZAPRINE HCL 10 MG PO TABS: 5.0000 mg | ORAL_TABLET | Freq: Three times a day (TID) | ORAL | Status: AC | PRN

## 2012-06-10 MED ORDER — SIMVASTATIN 40 MG PO TABS: 40.0000 mg | ORAL_TABLET | Freq: Every day | ORAL | Status: DC

## 2012-06-10 NOTE — Assessment & Plan Note (Signed)
Controlled, continue current meds and work on weight, diet.  Labs d/w pt.

## 2012-06-10 NOTE — Assessment & Plan Note (Signed)
Per surgery clinic. 

## 2012-06-10 NOTE — Assessment & Plan Note (Signed)
Routine anticipatory guidance given to patient.  See health maintenance. Pap smear not indicated now, consider in 2014 Mammogram up to date, has 6 month f/u per [facility].  Tetanus 2013 Flu shot done yearly Colonoscopy 2008.

## 2012-06-10 NOTE — Progress Notes (Signed)
CPE- See plan.  Routine anticipatory guidance given to patient.  See health maintenance. Pap smear not indicated now, consider in 2014 Mammogram up to date, has 6 month f/u per [facility].  Tetanus 2013 Flu shot done yearly Colonoscopy 2008.   Hypertension:    Using medication without problems or lightheadedness: yes Chest pain with exertion:no Edema:no Short of breath:no  Knee pain, s/p injection by ortho with some relief of pain.   Leg cramps continue.  K was wnl.  Worse just before waking, 6-7 AM.  If she stretches, it'll hurt worse.  Now it is every AM over the last week.  No daytime or exertional sx.    PMH and SH reviewed  Meds, vitals, and allergies reviewed.   ROS: See HPI.  Otherwise negative.    GEN: nad, alert and oriented, obese HEENT: mucous membranes moist NECK: supple w/o LA CV: rrr. PULM: ctab, no inc wob ABD: soft, +bs EXT: no edema SKIN: no acute rash Normal BP pulses Breast and pelvic exam deferred.

## 2012-06-10 NOTE — Patient Instructions (Signed)
I would get a flu shot each fall.   I think water aerobics is a great idea.  Recheck labs in 1 year before a physical.  Try the flexeril for the cramps.  Take care.

## 2012-06-10 NOTE — Assessment & Plan Note (Signed)
Controlled, continue current meds and work on weight, diet.  Labs d/w pt.  

## 2012-06-10 NOTE — Assessment & Plan Note (Signed)
Stretch and can try PM dose of flexeril.  F/u prn.  No claudication sx.

## 2012-06-10 NOTE — Assessment & Plan Note (Signed)
Mild.  Work on weight, diet.  Labs d/w pt.

## 2012-06-10 NOTE — Assessment & Plan Note (Signed)
Per ortho.  

## 2012-09-13 ENCOUNTER — Telehealth: Payer: Self-pay

## 2012-09-13 NOTE — Telephone Encounter (Signed)
Pt has question about billing and insurance. Gave pt [phone] to call for assistance.

## 2012-10-26 DIAGNOSIS — Z9221 Personal history of antineoplastic chemotherapy: Secondary | ICD-10-CM

## 2012-10-26 DIAGNOSIS — Z923 Personal history of irradiation: Secondary | ICD-10-CM

## 2012-10-26 DIAGNOSIS — C50919 Malignant neoplasm of unspecified site of unspecified female breast: Secondary | ICD-10-CM

## 2012-10-26 HISTORY — DX: Malignant neoplasm of unspecified site of unspecified female breast: C50.919

## 2012-10-26 HISTORY — DX: Personal history of antineoplastic chemotherapy: Z92.21

## 2012-10-26 HISTORY — DX: Personal history of irradiation: Z92.3

## 2012-10-26 HISTORY — PX: PORTACATH PLACEMENT: SHX2246

## 2012-12-12 ENCOUNTER — Ambulatory Visit: Payer: Self-pay | Admitting: General Surgery

## 2013-01-27 ENCOUNTER — Encounter: Payer: Self-pay | Admitting: Gastroenterology

## 2013-05-05 ENCOUNTER — Ambulatory Visit: Payer: BC Managed Care – PPO | Admitting: Family Medicine

## 2013-05-05 ENCOUNTER — Ambulatory Visit: Payer: Self-pay | Admitting: Physician Assistant

## 2013-06-13 ENCOUNTER — Telehealth: Payer: Self-pay | Admitting: *Deleted

## 2013-06-13 ENCOUNTER — Ambulatory Visit: Payer: Self-pay | Admitting: General Surgery

## 2013-06-13 NOTE — Telephone Encounter (Signed)
Message left for patient to call the office.   Dr. Lemar Livings has reviewed patient's mammogram and spoken to the radiologist in regards to this. He would like to see patient this Thursday, 06-15-13 at 8 am to do an ultrasound guided core biopsy (if patient can come) instead of waiting till appointment that is already scheduled for 06-21-13.

## 2013-06-14 ENCOUNTER — Encounter: Payer: Self-pay | Admitting: General Surgery

## 2013-06-14 NOTE — Telephone Encounter (Signed)
Patient returned call and will come 06-15-13 for appt. For possible biopsy.  Patient requested I call her back to answer some questions.  No answer.  I will make another attempt later.

## 2013-06-15 ENCOUNTER — Ambulatory Visit (INDEPENDENT_AMBULATORY_CARE_PROVIDER_SITE_OTHER): Payer: BC Managed Care – PPO | Admitting: General Surgery

## 2013-06-15 ENCOUNTER — Encounter: Payer: Self-pay | Admitting: General Surgery

## 2013-06-15 ENCOUNTER — Other Ambulatory Visit: Payer: Self-pay | Admitting: General Surgery

## 2013-06-15 VITALS — BP 132/74 | HR 60 | Resp 12 | Ht 65.0 in | Wt 297.0 lb

## 2013-06-15 DIAGNOSIS — R928 Other abnormal and inconclusive findings on diagnostic imaging of breast: Secondary | ICD-10-CM

## 2013-06-15 DIAGNOSIS — N63 Unspecified lump in unspecified breast: Secondary | ICD-10-CM

## 2013-06-15 NOTE — Progress Notes (Addendum)
Patient ID: Tina Delgado, female   DOB: 1956-10-05, 57 y.o.   MRN: [id number]  Chief Complaint  Patient presents with  . Other    mammogram    HPI Tina Delgado is a 57 y.o. female who presents for a breast evaluation. The most recent mammogram and ultrasound was done on 06/13/13. The patient was initially seen in fall 2013 with a few microcalcifications in the medial aspect of the right breast. On followup exam in February 2014 no interval change was appreciated.  The patient's recent mammogram showed an interval change and ultrasound suggest an associated mass. She is not aware of any trauma to the breast, although she did fall 8 weeks ago x-ray her foot. She denies any breast trauma at that time.  Patient does perform regular self breast checks and gets regular mammograms done.    HPI  Past Medical History  Diagnosis Date  . Hyperlipidemia   . Hypertension   . Morbid obesity   . Elevated glucose 05/2003    106  . Allergy   . Arthritis     s/p knee injection per ortho  . Mammographic microcalcification     Past Surgical History  Procedure Laterality Date  . Knee arthroscopy  03/04/2007    left Dr [facility]  . Joint replacement  07/30/2007    LTKR Dr [facility]  . Tubal ligation  ~1986    BTL  . Colonoscopy  2007    Family History  Problem Relation Age of Onset  . Heart disease Mother     MI  . Depression Mother     paranoia  . Hypertension Mother   . Stroke Mother   . Heart disease Brother 65    MI PTCA at 66  . Heart disease Father   . Colon cancer Neg Hx   . Breast cancer Neg Hx   . Stomach cancer Maternal Grandfather 9    Social History History  Substance Use Topics  . Smoking status: Never Smoker   . Smokeless tobacco: Never Used  . Alcohol Use: Yes     Comment: once a week    Allergies  Allergen Reactions  . Lisinopril Cough  . Sulfonamide Derivatives     REACTION: itching years ago    Current Outpatient Prescriptions  Medication Sig  Dispense Refill  . Aloe Vera 500 MG CAPS Take 1,200 mg by mouth daily.      . diphenhydramine-acetaminophen (TYLENOL PM EXTRA STRENGTH) 25-500 MG TABS Take 1 tablet by mouth at bedtime as needed.        . metoprolol (LOPRESSOR) 100 MG tablet Take 1 tablet (100 mg total) by mouth 2 (two) times daily.  180 tablet  3  . omeprazole (PRILOSEC) 20 MG capsule Take 20 mg by mouth daily.      . simvastatin (ZOCOR) 40 MG tablet Take 1 tablet (40 mg total) by mouth at bedtime.  90 tablet  3   No current facility-administered medications for this visit.    Review of Systems Review of Systems  Constitutional: Negative.   Respiratory: Negative.   Cardiovascular: Negative.     Blood pressure 132/74, pulse 60, resp. rate 12, height 5\' 5"  (1.651 m), weight 297 lb (134.718 kg).  Physical Exam Physical Exam  Constitutional: She is oriented to person, place, and time. She appears well-developed and well-nourished.  Eyes: No scleral icterus.  Cardiovascular: Normal rate, regular rhythm and normal heart sounds.   Pulmonary/Chest: Breath sounds normal. Right breast exhibits no  inverted nipple, no mass, no nipple discharge, no skin change and no tenderness. Left breast exhibits no inverted nipple, no mass, no nipple discharge, no skin change and no tenderness.  Lymphadenopathy:    She has no cervical adenopathy.    She has no axillary adenopathy.  Neurological: She is alert and oriented to person, place, and time.  Skin: Skin is warm and dry.    Data Reviewed Bilateral mammograms dated 06/13/2013 showed a new density in the right breast associated with calcifications. The left breast was unremarkable  Ultrasound showed a 0.8 x 1.2 x 1.3 cm hypoechoic mass 5 cm from the nipple. BI-RAD 5.  Ultrasound examination completed today confirmed a 1.0 x 1.2 x 1.26 irregular mass with acoustic shadowing. The patient was amenable to vacuum biopsy.  10 cc of 0.5% Xylocaine with 0.25% Marcaine with 1 200,000  epinephrine was utilized for local anesthesia well tolerated. The skin was prepped with ChloraPrep and draped. A 10-gauge Encor device was used and a core samples obtained. Scant bleeding was noted. A postbiopsy clip was placed. Skin defect was closed with benzoin and Steri-Strips followed by Telfa and Tegaderm dressing. The procedure was well-tolerated.  Assessment    Interval development of a new right breast mass at the site of indeterminate microcalcifications.    Plan    The patient was given written instructions regarding postbiopsy care. Arrangements were made for office followup with nurse in one week. She will be contacted by phone when pathology is available.       [name] 06/15/2013, 8:38 AM

## 2013-06-15 NOTE — Patient Instructions (Signed)

## 2013-06-16 ENCOUNTER — Telehealth: Payer: Self-pay | Admitting: General Surgery

## 2013-06-16 NOTE — Telephone Encounter (Signed)
The patient returned by call to get the results of yesterday's right breast biopsy. She was advised that did show evidence of breast cancer.  Arrangements have been made for an office evaluation discuss options on 06/19/2013. Arrange for laboratory studies at that time. She was encouraged to bring a family member if possible.

## 2013-06-16 NOTE — Addendum Note (Signed)
Addended by: Levada Schilling on: 06/16/2013 09:36 AM   Modules accepted: Level of Service

## 2013-06-19 ENCOUNTER — Encounter: Payer: Self-pay | Admitting: General Surgery

## 2013-06-19 ENCOUNTER — Ambulatory Visit (INDEPENDENT_AMBULATORY_CARE_PROVIDER_SITE_OTHER): Payer: BC Managed Care – PPO | Admitting: General Surgery

## 2013-06-19 VITALS — BP 134/76 | HR 64 | Resp 14 | Ht 65.0 in | Wt 296.0 lb

## 2013-06-19 DIAGNOSIS — C50919 Malignant neoplasm of unspecified site of unspecified female breast: Secondary | ICD-10-CM

## 2013-06-19 NOTE — Progress Notes (Signed)
Patient ID: Tina Delgado, female   DOB: 02-27-1956, 57 y.o.   MRN: [id number]   The patient returned today having undergone a  right breast biopsy last week. She had been notified by phone the results did show evidence of cancer. She is accompanied today by her daughter, Kewanda Poland was present for the interview.  Examination of the right breast shows minimal bruising from her biopsy. No hematoma formation. No evidence of infection. We spent approximately an hour reviewing the options for management of this clinical stage I carcinoma of the breast. The significant change of her mammogram from February 2014 through August 2014 with the development of a mass for the first time was reviewed.  Breast conservation and mastectomy were presented as equally efficacious options. The pros and cons of each were reviewed. The indications for post surgery radiation should breast conservation be chosen it was discussed. Partial breast and whole breast radiation options were reviewed.  Possibility for plastic surgery consultation, second surgical opinion and evaluation by medical/radiation oncology if desired was reviewed.  An informational brochure was provided. We spent about an hour reviewing her options.  She'll contact the office if she has any questions, desires to have a second opinion provided or to schedule surgery.  Patient to have the following labs drawn at [facility]: CBC, Met C, CEA, and CA 27-29.

## 2013-06-19 NOTE — Patient Instructions (Signed)
Patient to have labs drawn at [facility].  

## 2013-06-20 ENCOUNTER — Telehealth: Payer: Self-pay | Admitting: *Deleted

## 2013-06-20 LAB — COMPREHENSIVE METABOLIC PANEL
ALT: 25 IU/L (ref 0–32)
AST: 23 IU/L (ref 0–40)
Albumin/Globulin Ratio: 1.5 (ref 1.1–2.5)
Albumin: 4.2 g/dL (ref 3.5–5.5)
Alkaline Phosphatase: 85 IU/L (ref 39–117)
BUN/Creatinine Ratio: 11 (ref 9–23)
BUN: 10 mg/dL (ref 6–24)
CO2: 25 mmol/L (ref 18–29)
Calcium: 9.8 mg/dL (ref 8.7–10.2)
Chloride: 99 mmol/L (ref 97–108)
Creatinine, Ser: 0.87 mg/dL (ref 0.57–1.00)
GFR calc Af Amer: 85 mL/min/{1.73_m2} (ref >59)
GFR calc non Af Amer: 74 mL/min/{1.73_m2} (ref >59)
Globulin, Total: 2.8 g/dL (ref 1.5–4.5)
Glucose: 118 mg/dL — ABNORMAL HIGH (ref 65–99)
Potassium: 4 mmol/L (ref 3.5–5.2)
Sodium: 142 mmol/L (ref 134–144)
Total Bilirubin: 0.3 mg/dL (ref 0.0–1.2)
Total Protein: 7 g/dL (ref 6.0–8.5)

## 2013-06-20 LAB — CBC WITH DIFFERENTIAL/PLATELET
Basophils Absolute: 0 10*3/uL (ref 0.0–0.2)
Basos: 0 % (ref 0–3)
Eos: 3 % (ref 0–5)
Eosinophils Absolute: 0.4 10*3/uL (ref 0.0–0.4)
HCT: 46.6 % (ref 34.0–46.6)
Hemoglobin: 15.7 g/dL (ref 11.1–15.9)
Immature Grans (Abs): 0 10*3/uL (ref 0.0–0.1)
Immature Granulocytes: 0 % (ref 0–2)
Lymphocytes Absolute: 2.9 10*3/uL (ref 0.7–3.1)
Lymphs: 23 % (ref 14–46)
MCH: 30.5 pg (ref 26.6–33.0)
MCHC: 33.7 g/dL (ref 31.5–35.7)
MCV: 91 fL (ref 79–97)
Monocytes Absolute: 0.9 10*3/uL (ref 0.1–0.9)
Monocytes: 7 % (ref 4–12)
Neutrophils Absolute: 8.1 10*3/uL — ABNORMAL HIGH (ref 1.4–7.0)
Neutrophils Relative %: 67 % (ref 40–74)
RBC: 5.14 x10E6/uL (ref 3.77–5.28)
RDW: 13.7 % (ref 12.3–15.4)
WBC: 12.3 10*3/uL — ABNORMAL HIGH (ref 3.4–10.8)

## 2013-06-20 LAB — CEA: CEA: 1.1 ng/mL (ref 0.0–4.7)

## 2013-06-20 LAB — CANCER ANTIGEN 27.29: CA 27.29: 15.8 U/mL (ref 0.0–38.6)

## 2013-06-20 NOTE — Telephone Encounter (Signed)
Message copied by Currie Paris on Tue Jun 20, 2013 12:53 PM ------      Message from: [name]      Created: Tue Jun 20, 2013 10:56 AM       Please notify the patient her labs did NOT show any evidence of cancer spread. (Breast, discussed options last PM).  White blood cell count up slightly, any UTI / cough symptoms?      May have questions about surgery. Thanks.      ----- Message -----         From: [facility] Lab Results In Interface         Sent: 06/20/2013   5:44 AM           To: [name], MD                   ------

## 2013-06-21 ENCOUNTER — Ambulatory Visit: Payer: Self-pay | Admitting: General Surgery

## 2013-06-21 ENCOUNTER — Telehealth: Payer: Self-pay | Admitting: *Deleted

## 2013-06-21 LAB — PATHOLOGY

## 2013-06-21 NOTE — Telephone Encounter (Signed)
Patient called the office wanting to schedule right lumpectomy. Please complete surgery sheet on your desk and I will arrange. Thanks.

## 2013-06-21 NOTE — Telephone Encounter (Signed)
Notified patient as instructed, patient pleased. States she maybe getting UTI will increase po water intake.  She is still deciding about surgery but leaning toward a lumpectomy.  She will call back once final.

## 2013-06-22 ENCOUNTER — Ambulatory Visit (INDEPENDENT_AMBULATORY_CARE_PROVIDER_SITE_OTHER): Payer: BC Managed Care – PPO | Admitting: *Deleted

## 2013-06-22 DIAGNOSIS — C50919 Malignant neoplasm of unspecified site of unspecified female breast: Secondary | ICD-10-CM

## 2013-06-22 NOTE — Progress Notes (Signed)
Patient here today for follow up post right  breast biopsy.   Moderate bruising noted and small blister above biopsy site where the tegaderm was.  The patient is aware that a heating pad may be used for comfort as needed.  Aware of pathology. Follow up as scheduled.

## 2013-06-26 HISTORY — PX: BREAST EXCISIONAL BIOPSY: SUR124

## 2013-06-27 ENCOUNTER — Telehealth: Payer: Self-pay | Admitting: *Deleted

## 2013-06-27 NOTE — Telephone Encounter (Signed)
Patient's surgery has been scheduled for 07-20-13 at [facility]. She is aware of all instructions and will call if she has further questions.

## 2013-07-05 ENCOUNTER — Encounter: Payer: Self-pay | Admitting: General Surgery

## 2013-07-06 ENCOUNTER — Telehealth: Payer: Self-pay | Admitting: *Deleted

## 2013-07-06 NOTE — Telephone Encounter (Signed)
Copy of FMLA papers to pick up are ready

## 2013-07-08 ENCOUNTER — Other Ambulatory Visit: Payer: Self-pay | Admitting: General Surgery

## 2013-07-08 DIAGNOSIS — C50919 Malignant neoplasm of unspecified site of unspecified female breast: Secondary | ICD-10-CM

## 2013-07-17 ENCOUNTER — Ambulatory Visit: Payer: Self-pay | Admitting: Anesthesiology

## 2013-07-17 DIAGNOSIS — I10 Essential (primary) hypertension: Secondary | ICD-10-CM

## 2013-07-20 ENCOUNTER — Ambulatory Visit: Payer: Self-pay | Admitting: General Surgery

## 2013-07-20 DIAGNOSIS — C50319 Malignant neoplasm of lower-inner quadrant of unspecified female breast: Secondary | ICD-10-CM

## 2013-07-20 DIAGNOSIS — C50419 Malignant neoplasm of upper-outer quadrant of unspecified female breast: Secondary | ICD-10-CM

## 2013-07-20 HISTORY — PX: BREAST SURGERY: SHX581

## 2013-07-20 HISTORY — DX: Malignant neoplasm of upper-outer quadrant of unspecified female breast: C50.419

## 2013-07-24 LAB — PATHOLOGY REPORT

## 2013-07-25 ENCOUNTER — Ambulatory Visit (INDEPENDENT_AMBULATORY_CARE_PROVIDER_SITE_OTHER): Payer: BC Managed Care – PPO | Admitting: General Surgery

## 2013-07-25 ENCOUNTER — Encounter: Payer: Self-pay | Admitting: General Surgery

## 2013-07-25 VITALS — BP 132/92 | HR 74 | Resp 16 | Ht 65.0 in | Wt 291.0 lb

## 2013-07-25 DIAGNOSIS — C50919 Malignant neoplasm of unspecified site of unspecified female breast: Secondary | ICD-10-CM

## 2013-07-25 DIAGNOSIS — C50319 Malignant neoplasm of lower-inner quadrant of unspecified female breast: Secondary | ICD-10-CM

## 2013-07-25 DIAGNOSIS — C50311 Malignant neoplasm of lower-inner quadrant of right female breast: Secondary | ICD-10-CM | POA: Insufficient documentation

## 2013-07-25 NOTE — Progress Notes (Signed)
Patient ID: Tina Delgado, female   DOB: 12-13-1955, 57 y.o.   MRN: [id number]  Chief Complaint  Patient presents with  . Routine Post Op    right breast lumpectomy    HPI Tina Delgado is a 57 y.o. female here today for her post op right breast lumpectomy done on 07/20/13. Patient states she is still sore and swelling under her right arm.  HPI  Past Medical History  Diagnosis Date  . Hyperlipidemia   . Hypertension   . Morbid obesity   . Elevated glucose 05/2003    106  . Allergy   . Arthritis     s/p knee injection per ortho  . Mammographic microcalcification   . Malignant neoplasm of upper-outer quadrant of female breast July 20, 2013    histologic grade 3 invasive mammary carcinoma, 13 mm,  2/16 nodes positive, ER-positive, PR positive, HER-2/neu not amplified.W0J,W1X    Past Surgical History  Procedure Laterality Date  . Knee arthroscopy  03/04/2007    left Dr [facility]  . Joint replacement  07/30/2007    LTKR Dr [facility]  . Tubal ligation  ~1986    BTL  . Colonoscopy  2007  . Breast surgery Right 07-20-13    lumpectomy    Family History  Problem Relation Age of Onset  . Heart disease Mother     MI  . Depression Mother     paranoia  . Hypertension Mother   . Stroke Mother   . Heart disease Brother 59    MI PTCA at 28  . Heart disease Father   . Colon cancer Neg Hx   . Breast cancer Neg Hx   . Stomach cancer Maternal Grandfather 11    Social History History  Substance Use Topics  . Smoking status: Never Smoker   . Smokeless tobacco: Never Used  . Alcohol Use: Yes     Comment: once a week    Allergies  Allergen Reactions  . Lisinopril Cough  . Sulfonamide Derivatives     REACTION: itching years ago    Current Outpatient Prescriptions  Medication Sig Dispense Refill  . Aloe Vera 500 MG CAPS Take 1,200 mg by mouth daily.      . diphenhydramine-acetaminophen (TYLENOL PM EXTRA STRENGTH) 25-500 MG TABS Take 1 tablet by mouth at bedtime as  needed.        [name] HYDROcodone-acetaminophen (NORCO/VICODIN) 5-325 MG per tablet Take 1 tablet by mouth as needed.      . metoprolol (LOPRESSOR) 100 MG tablet Take 1 tablet (100 mg total) by mouth 2 (two) times daily.  180 tablet  3  . omeprazole (PRILOSEC) 20 MG capsule Take 20 mg by mouth daily.      . simvastatin (ZOCOR) 40 MG tablet Take 1 tablet (40 mg total) by mouth at bedtime.  90 tablet  3   No current facility-administered medications for this visit.    Review of Systems Review of Systems  Constitutional: Negative.   Respiratory: Negative.   Cardiovascular: Negative.     Blood pressure 132/92, pulse 74, resp. rate 16, height 5\' 5"  (1.651 m), weight 291 lb (131.997 kg).  Physical Exam Physical Exam  Constitutional: She is oriented to person, place, and time. She appears well-developed and well-nourished.  Lymphadenopathy:    She has no cervical adenopathy.  Neurological: She is alert and oriented to person, place, and time.  Skin: Skin is warm and dry.  Mild bruising from the local anesthetic as noted. Both  incisions are healing well. No evidence of skin or nipple ischemia. No evidence of loculated fluid collection.  Data Reviewed T1c,N1a histologic grade 3 ER/PR positive tumor of the right breast. Drain record shows volume was about 40 cc per day.  Assessment    The patient has done well post wide excision and axillary dissection.        Plan    The patient may be a candidate for adjuvant chemotherapy based on the findings of 2/16 nodes positive. She may benefit from Mammoprint analysis based on her node positive status. She is amenable to a formal evaluation with medical oncology. With the finding of positive axillary nodes, she would be best served by whole breast radiation.     This patient has been scheduled for an appointment with Dr. Wendie Simmer at the [facility] for 07-27-13 at 2 pm.    Tina Delgado 07/25/2013, 9:31 PM

## 2013-07-25 NOTE — Patient Instructions (Addendum)
Patient to see the medical oncologist.  This patient has been scheduled for an appointment with Dr. Wendie Simmer at the [facility] for 07-27-13 at 2 pm.

## 2013-07-26 ENCOUNTER — Encounter: Payer: Self-pay | Admitting: General Surgery

## 2013-07-27 ENCOUNTER — Other Ambulatory Visit: Payer: Self-pay | Admitting: General Surgery

## 2013-07-27 ENCOUNTER — Ambulatory Visit: Payer: Self-pay | Admitting: Hematology and Oncology

## 2013-07-27 DIAGNOSIS — C50311 Malignant neoplasm of lower-inner quadrant of right female breast: Secondary | ICD-10-CM

## 2013-08-02 ENCOUNTER — Other Ambulatory Visit: Payer: Self-pay

## 2013-08-02 ENCOUNTER — Encounter: Payer: Self-pay | Admitting: General Surgery

## 2013-08-02 ENCOUNTER — Ambulatory Visit: Payer: Self-pay | Admitting: General Surgery

## 2013-08-02 VITALS — BP 142/94 | HR 80 | Temp 99.1°F | Resp 16 | Ht 65.0 in | Wt 285.0 lb

## 2013-08-02 DIAGNOSIS — IMO0002 Reserved for concepts with insufficient information to code with codable children: Secondary | ICD-10-CM

## 2013-08-02 DIAGNOSIS — C50311 Malignant neoplasm of lower-inner quadrant of right female breast: Secondary | ICD-10-CM

## 2013-08-02 MED ORDER — METOPROLOL TARTRATE 100 MG PO TABS: 100.0000 mg | ORAL_TABLET | Freq: Two times a day (BID) | ORAL | Status: DC

## 2013-08-02 MED ORDER — SIMVASTATIN 40 MG PO TABS: 40.0000 mg | ORAL_TABLET | Freq: Every day | ORAL | Status: DC

## 2013-08-02 MED ORDER — CEFADROXIL 500 MG PO CAPS: 500.0000 mg | ORAL_CAPSULE | Freq: Two times a day (BID) | ORAL | Status: DC

## 2013-08-02 NOTE — Telephone Encounter (Signed)
Sent, thanks.  I wish her only the best.  I'd like to see her when she is done with treatments and it is convenient for her.  Clearly the treatment is more important than seeing me in the near future.

## 2013-08-02 NOTE — Progress Notes (Signed)
Patient ID: Tina Delgado, female   DOB: 11-Jun-1956, 57 y.o.   MRN: [id number]  Chief Complaint  Patient presents with  . Routine Post Op    HPI Tina Delgado is a 57 y.o. female.  Here today for postoperative visit, right lumpectomy, sentinel node biopsy Dissection included 07-20-13. States the soreness is getting better.  Drain sheet present. She has port placement scheduled for 08-03-13. The patient reports the drain volume started to fall 2 days ago. Drain record shows that is now below 30 cc per day for the last 2 days. She otherwise feels well. HPI  Past Medical History  Diagnosis Date  . Hyperlipidemia   . Hypertension   . Morbid obesity   . Elevated glucose 05/2003    106  . Allergy   . Arthritis     s/p knee injection per ortho  . Mammographic microcalcification   . Malignant neoplasm of upper-outer quadrant of female breast July 20, 2013    histologic grade 3 invasive mammary carcinoma, 13 mm,  2/16 nodes positive, ER-positive, PR positive, HER-2/neu not amplified.W1U,U7O    Past Surgical History  Procedure Laterality Date  . Knee arthroscopy  03/04/2007    left Dr [facility]  . Joint replacement  07/30/2007    LTKR Dr [facility]  . Tubal ligation  ~1986    BTL  . Colonoscopy  2007  . Breast surgery Right 07-20-13    Wide excision, SLN biopsy, axillary dissection.    Family History  Problem Relation Age of Onset  . Heart disease Mother     MI  . Depression Mother     paranoia  . Hypertension Mother   . Stroke Mother   . Heart disease Brother 64    MI PTCA at 14  . Heart disease Father   . Colon cancer Neg Hx   . Breast cancer Neg Hx   . Stomach cancer Maternal Grandfather 26    Social History History  Substance Use Topics  . Smoking status: Never Smoker   . Smokeless tobacco: Never Used  . Alcohol Use: Yes     Comment: once a week    Allergies  Allergen Reactions  . Lisinopril Cough  . Sulfonamide Derivatives     REACTION: itching  years ago    Current Outpatient Prescriptions  Medication Sig Dispense Refill  . cefadroxil (DURICEF) 500 MG capsule Take 1 capsule (500 mg total) by mouth 2 (two) times daily.  20 capsule  1  . diphenhydramine-acetaminophen (TYLENOL PM EXTRA STRENGTH) 25-500 MG TABS Take 1 tablet by mouth at bedtime as needed.        . metoprolol (LOPRESSOR) 100 MG tablet Take 1 tablet (100 mg total) by mouth 2 (two) times daily.  180 tablet  3  . omeprazole (PRILOSEC) 20 MG capsule Take 20 mg by mouth daily.      . simvastatin (ZOCOR) 40 MG tablet Take 1 tablet (40 mg total) by mouth at bedtime.  90 tablet  3   No current facility-administered medications for this visit.    Review of Systems Review of Systems  Constitutional: Negative.   Respiratory: Negative.   Cardiovascular: Negative.     Blood pressure 142/94, pulse 80, temperature 99.1 F (37.3 C), temperature source Oral, resp. rate 16, height 5\' 5"  (1.651 m), weight 285 lb (129.275 kg).  Physical Exam Physical Exam  Constitutional: She is oriented to person, place, and time. She appears well-developed and well-nourished.  Pulmonary/Chest:  There is erythema  over the wide excision site. Widening of the exit site from the JP drain. Culture obtained. No focal tenderness. No loculated fluid in either the axilla or the wide excision site.  Neurological: She is alert and oriented to person, place, and time.  Skin: Skin is warm and dry.    Data Reviewed No new data.  Assessment    Possible early wound infection.    Plan    The plan power port placement scheduled for October 9 will be rescheduled for later in the month. Based on today's clinical exam in the low-grade temperature noted, she'll be placed on Duricef 500 mg by mouth twice a day pending culture results (sulfa allergy).    Patient's port placement that was originally scheduled for 08-03-13 at [facility] will be postponed until 08-15-13. She will follow up next Tuesday, 08-08-13 in  the office for a re-check.  Tina Delgado 08/03/2013, 5:06 PM

## 2013-08-02 NOTE — Telephone Encounter (Signed)
Pt left v/m; pt has been diagnosed with breast cancer and will start treatments in 2 weeks; pt request refill metoprolol and simvastatin to [facility] [address]. Pt last seen 06/10/2012.Please advise.

## 2013-08-02 NOTE — Patient Instructions (Signed)
Follow up next week Keep area clean and use gauze pads Port a cath will be rescheduled

## 2013-08-03 ENCOUNTER — Encounter: Payer: Self-pay | Admitting: General Surgery

## 2013-08-05 ENCOUNTER — Telehealth: Payer: Self-pay | Admitting: General Surgery

## 2013-08-05 DIAGNOSIS — T798XXA Other early complications of trauma, initial encounter: Secondary | ICD-10-CM

## 2013-08-05 LAB — AEROBIC CULTURE

## 2013-08-05 MED ORDER — CIPROFLOXACIN HCL 500 MG PO TABS: 500.0000 mg | ORAL_TABLET | Freq: Two times a day (BID) | ORAL | Status: AC

## 2013-08-05 NOTE — Telephone Encounter (Signed)
Culture results received:  Aerobic Bacterial Culture Final report (A)     Result 1 Staphylococcus aureus (A)    Comments: Heavy growth Based on resistance to penicillin and susceptibility to oxacillin this isolate would be susceptible to: * Penicillinase-stable penicillins; such as: Cloxacillin Dicloxacillin Nafcillin * Beta-lactam/beta-lactamase inhibitor combinations; such as: Amoxicillin-clavulanic acid Ampicillin-sulbactam * Antistaphylococcal cephems; such as: Cefaclor Cefuroxime * Antistaphylococcal carbapenems; such as: Imipenem Meropenem   Result 2 Serratia marcescens (A)     Comments: Light growth   ANTIMICROBIAL SUSCEPTIBILITY Comment     Comments:  S = Susceptible; I = Intermediate; R = Resistant  P = Positive; N = Negative MICS are expressed in micrograms per mL Antibiotic RSLT#1 RSLT#2 RSLT#3 RSLT#4 Amoxicillin/Clavulanic Acid R Cefazolin R Cefepime S Ceftriaxone S Cefuroxime R Ciprofloxacin S S Clindamycin S Ertapenem S Erythromycin S Gentamicin S S Imipenem S Levofloxacin S S Linezolid S Moxifloxacin S Oxacillin S Penicillin R Piperacillin S Quinupristin/Dalfopristin S Rifampin S Tetracycline S R Tobramycin S Trimethoprim/Sulfa S S Vancomycin S   Resulting Agency [facility]    Result Narrative   In light of the finding of Serratia, the Duricef prescribed originally will be discontinued and she will be placed on Cipro 500 mg by mouth twice a day for 10 days.  The patient reports that she's feeling well, notices less redness in the breast and is having no fevers.  Office follow up will take place is scheduled on October 14.

## 2013-08-07 ENCOUNTER — Encounter: Payer: Self-pay | Admitting: General Surgery

## 2013-08-08 ENCOUNTER — Ambulatory Visit (INDEPENDENT_AMBULATORY_CARE_PROVIDER_SITE_OTHER): Payer: BC Managed Care – PPO | Admitting: General Surgery

## 2013-08-08 ENCOUNTER — Encounter: Payer: Self-pay | Admitting: General Surgery

## 2013-08-08 VITALS — BP 154/84 | HR 80 | Resp 16 | Ht 65.0 in | Wt 292.0 lb

## 2013-08-08 DIAGNOSIS — C50311 Malignant neoplasm of lower-inner quadrant of right female breast: Secondary | ICD-10-CM

## 2013-08-08 DIAGNOSIS — C50319 Malignant neoplasm of lower-inner quadrant of unspecified female breast: Secondary | ICD-10-CM

## 2013-08-08 NOTE — Progress Notes (Signed)
Patient ID: Tina Delgado, female   DOB: Mar 23, 1956, 57 y.o.   MRN: [id number]  Chief Complaint  Patient presents with  . Routine Post Op    HPI Tina Delgado is a 57 y.o. female.  Here today for postoperative visit, right lumpectomy, sentinel node biopsy dissection included 07-20-13. States the soreness and redness is getting better.  Currently on Cipro, a seroma developed and culture was obtained.  States their is a odor .She has port placement scheduled for 08/15/13.  HPI  Past Medical History  Diagnosis Date  . Hyperlipidemia   . Hypertension   . Morbid obesity   . Elevated glucose 05/2003    106  . Allergy   . Arthritis     s/p knee injection per ortho  . Mammographic microcalcification   . Malignant neoplasm of upper-outer quadrant of female breast July 20, 2013    histologic grade 3 invasive mammary carcinoma, 13 mm,  2/16 nodes positive, ER-positive, PR positive, HER-2/neu not amplified.W0J,W1X    Past Surgical History  Procedure Laterality Date  . Knee arthroscopy  03/04/2007    left Dr [facility]ith [facility]  . Joint replacement  07/30/2007    LTKR Dr Kennith [facility]  . Tubal ligation  ~1986    BTL  . Colonoscopy  2007  . Breast surgery Right 07-20-13    Wide excision, SLN biopsy, axillary dissection.    Family History  Problem Relation Age of Onset  . Heart disease Mother     MI  . Depression Mother     paranoia  . Hypertension Mother   . Stroke Mother   . Heart disease Brother 61    MI PTCA at 50  . Heart disease Father   . Colon cancer Neg Hx   . Breast cancer Neg Hx   . Stomach cancer Maternal Grandfather 1    Social History History  Substance Use Topics  . Smoking status: Never Smoker   . Smokeless tobacco: Never Used  . Alcohol Use: Yes     Comment: once a week    Allergies  Allergen Reactions  . Lisinopril Cough  . Sulfonamide Derivatives     REACTION: itching years ago    Current Outpatient Prescriptions  Medication Sig Dispense Refill  .  ciprofloxacin (CIPRO) 500 MG tablet Take 1 tablet (500 mg total) by mouth 2 (two) times daily.  20 tablet  1  . diphenhydramine-acetaminophen (TYLENOL PM EXTRA STRENGTH) 25-500 MG TABS Take 1 tablet by mouth at bedtime as needed.        . metoprolol (LOPRESSOR) 100 MG tablet Take 1 tablet (100 mg total) by mouth 2 (two) times daily.  180 tablet  3  . omeprazole (PRILOSEC) 20 MG capsule Take 20 mg by mouth daily.      . simvastatin (ZOCOR) 40 MG tablet Take 1 tablet (40 mg total) by mouth at bedtime.  90 tablet  3   No current facility-administered medications for this visit.    Review of Systems Review of Systems  Constitutional: Negative.   Respiratory: Negative.   Cardiovascular: Negative.     Blood pressure 154/84, pulse 80, resp. rate 16, height 5\' 5"  (1.651 m), weight 292 lb (132.45 kg).  Physical Exam Physical Exam  Constitutional: She is oriented to person, place, and time. She appears well-developed and well-nourished.  Pulmonary/Chest:  Right breast lumpectomy site looks clean. Thickening along the wide excision site. 95% resolution of previously identified erythema. No focal tenderness. Axillary wound and drain site well  healed.  Neurological: She is alert and oriented to person, place, and time.  Skin: Skin is warm and dry.    Data Reviewed Culture Final report (A)    Result 1 Staphylococcus aureus (A)   Comments: Heavy growth Based on resistance to penicillin and susceptibility to oxacillin this isolate would be susceptible to: * Penicillinase-stable penicillins; such as: Cloxacillin Dicloxacillin Nafcillin * Beta-lactam/beta-lactamase inhibitor combinations; such as: Amoxicillin-clavulanic acid Ampicillin-sulbactam * Antistaphylococcal cephems; such as: Cefaclor Cefuroxime * Antistaphylococcal carbapenems; such as: Imipenem Meropenem  Result 2 Serratia marcescens (A)    Comments: Light growth  ANTIMICROBIAL SUSCEPTIBILITY Comment    Comments:   S =  Susceptible; I = Intermediate; R = Resistant  P = Positive; N = Negative MICS are expressed in micrograms per mL Antibiotic RSLT#1 RSLT#2 RSLT#3 RSLT#4 Amoxicillin/Clavulanic Acid R Cefazolin R Cefepime S Ceftriaxone S Cefuroxime R Ciprofloxacin S S Clindamycin S Ertapenem S Erythromycin S Gentamicin S S Imipenem S Levofloxacin S S Linezolid S Moxifloxacin S Oxacillin S Penicillin R Piperacillin S Quinupristin/Dalfopristin S Rifampin S Tetracycline S R Tobramycin S Trimethoprim/Sulfa S S Vancomycin S  Resulting Agency [facility]   Result Narrative    Performed at: [address], [place], [place] [id number]      Assessment    Resolution of right axillary infection. Candidate for adjuvant chemotherapy. Need for power port placement.    Plan    The patient will complete her present course of antibiotics. We'll plan for power port placement on 08/15/2013. The patient is aware that if there is a suggestion of active infection the procedure will be postponed.       [name] 08/09/2013, 6:59 AM

## 2013-08-08 NOTE — Patient Instructions (Signed)
Patient to have her port placed on 08/15/13.

## 2013-08-09 ENCOUNTER — Other Ambulatory Visit: Payer: Self-pay | Admitting: General Surgery

## 2013-08-09 DIAGNOSIS — C50311 Malignant neoplasm of lower-inner quadrant of right female breast: Secondary | ICD-10-CM

## 2013-08-15 ENCOUNTER — Ambulatory Visit: Payer: Self-pay | Admitting: General Surgery

## 2013-08-15 DIAGNOSIS — C50319 Malignant neoplasm of lower-inner quadrant of unspecified female breast: Secondary | ICD-10-CM

## 2013-08-16 ENCOUNTER — Encounter: Payer: Self-pay | Admitting: General Surgery

## 2013-08-16 LAB — HER-2 / NEU, FISH
Avg Num CEP17 probes/nucleus:: 2.7
Avg Num Her-2 signals/nucleus:: 3.2
HER-2/CEP17 Ratio: 1.14
Number of Observers:: 2
Number of Tumor Cells Counted:: 40

## 2013-08-16 LAB — ER/PR,IMMUNOHISTOCHEM,PARAFFIN
Estrogen Receptor IHC: 90 %
Progesterone Recp IP: 90 %

## 2013-08-17 LAB — COMPREHENSIVE METABOLIC PANEL
Albumin: 3.2 g/dL — ABNORMAL LOW (ref 3.4–5.0)
Alkaline Phosphatase: 90 U/L (ref 50–136)
Anion Gap: 6 — ABNORMAL LOW (ref 7–16)
BUN: 10 mg/dL (ref 7–18)
Bilirubin,Total: 0.4 mg/dL (ref 0.2–1.0)
Calcium, Total: 8.4 mg/dL — ABNORMAL LOW (ref 8.5–10.1)
Chloride: 103 mmol/L (ref 98–107)
Co2: 33 mmol/L — ABNORMAL HIGH (ref 21–32)
Creatinine: 1.06 mg/dL (ref 0.60–1.30)
EGFR (African American): >60
EGFR (Non-African Amer.): 57 — ABNORMAL LOW
Glucose: 132 mg/dL — ABNORMAL HIGH (ref 65–99)
Osmolality: 284 (ref 275–301)
Potassium: 4.4 mmol/L (ref 3.5–5.1)
SGOT(AST): 22 U/L (ref 15–37)
SGPT (ALT): 33 U/L (ref 12–78)
Sodium: 142 mmol/L (ref 136–145)
Total Protein: 7 g/dL (ref 6.4–8.2)

## 2013-08-17 LAB — CBC CANCER CENTER
Basophil #: 0.1 x10 3/mm (ref 0.0–0.1)
Basophil %: 1 %
Eosinophil #: 0.3 x10 3/mm (ref 0.0–0.7)
Eosinophil %: 3 %
HCT: 43.9 % (ref 35.0–47.0)
HGB: 14.8 g/dL (ref 12.0–16.0)
Lymphocyte #: 2.1 x10 3/mm (ref 1.0–3.6)
Lymphocyte %: 24 %
MCH: 30.3 pg (ref 26.0–34.0)
MCHC: 33.7 g/dL (ref 32.0–36.0)
MCV: 90 fL (ref 80–100)
Monocyte #: 0.6 x10 3/mm (ref 0.2–0.9)
Monocyte %: 7.2 %
Neutrophil #: 5.6 x10 3/mm (ref 1.4–6.5)
Neutrophil %: 64.8 %
Platelet: 222 x10 3/mm (ref 150–440)
RBC: 4.89 10*6/uL (ref 3.80–5.20)
RDW: 13.6 % (ref 11.5–14.5)
WBC: 8.7 x10 3/mm (ref 3.6–11.0)

## 2013-08-18 ENCOUNTER — Encounter: Payer: Self-pay | Admitting: General Surgery

## 2013-08-26 ENCOUNTER — Ambulatory Visit: Payer: Self-pay | Admitting: Hematology and Oncology

## 2013-08-28 ENCOUNTER — Ambulatory Visit (INDEPENDENT_AMBULATORY_CARE_PROVIDER_SITE_OTHER): Payer: BC Managed Care – PPO | Admitting: General Surgery

## 2013-08-28 ENCOUNTER — Encounter: Payer: Self-pay | Admitting: General Surgery

## 2013-08-28 VITALS — BP 140/84 | HR 84 | Resp 14 | Ht 65.0 in | Wt 290.0 lb

## 2013-08-28 DIAGNOSIS — C50319 Malignant neoplasm of lower-inner quadrant of unspecified female breast: Secondary | ICD-10-CM

## 2013-08-28 DIAGNOSIS — C50311 Malignant neoplasm of lower-inner quadrant of right female breast: Secondary | ICD-10-CM

## 2013-08-28 NOTE — Patient Instructions (Addendum)
Patient advised she can use [employer] for her rash. Patient to return in 2 months.

## 2013-08-28 NOTE — Progress Notes (Signed)
Patient ID: Tina Delgado, female   DOB: 02/07/1956, 57 y.o.   MRN: [id number]  Chief Complaint  Patient presents with  . Routine Post Op    14 day post op port placement    HPI Tina Delgado is a 57 y.o. female who presents for a post op port placement. The procedure was done on 08/15/13. The patient is doing well. The only complaint at this time is she has a rash between her breasts as well on her right side. She noticed the rash on Saturday. She has been taking benadryl for the rash. The patient started her first chemotherapy treatment on Thursday of last week.     HPI  Past Medical History  Diagnosis Date  . Hyperlipidemia   . Hypertension   . Morbid obesity   . Elevated glucose 05/2003    106  . Allergy   . Arthritis     s/p knee injection per ortho  . Mammographic microcalcification   . Malignant neoplasm of upper-outer quadrant of female breast July 20, 2013    histologic grade 3 invasive mammary carcinoma, 13 mm,  2/16 nodes positive, ER-positive, PR positive, HER-2/neu not amplified.Y7W,G9F    Past Surgical History  Procedure Laterality Date  . Knee arthroscopy  03/04/2007    left Dr Kennith [facility]  . Joint replacement  07/30/2007    LTKR Dr Kennith [facility]  . Tubal ligation  ~1986    BTL  . Colonoscopy  2007  . Breast surgery Right 07-20-13    Wide excision, SLN biopsy, axillary dissection.  . Portacath placement  2014    Family History  Problem Relation Age of Onset  . Heart disease Mother     MI  . Depression Mother     paranoia  . Hypertension Mother   . Stroke Mother   . Heart disease Brother 67    MI PTCA at 9  . Heart disease Father   . Colon cancer Neg Hx   . Breast cancer Neg Hx   . Stomach cancer Maternal Grandfather 32    Social History History  Substance Use Topics  . Smoking status: Never Smoker   . Smokeless tobacco: Never Used  . Alcohol Use: Yes     Comment: once a week    Allergies  Allergen Reactions  . Lisinopril Cough  .  Sulfonamide Derivatives     REACTION: itching years ago    Current Outpatient Prescriptions  Medication Sig Dispense Refill  . diphenhydrAMINE (BENADRYL) 25 MG tablet Take 25 mg by mouth every 6 (six) hours as needed for itching.      . diphenhydramine-acetaminophen (TYLENOL PM EXTRA STRENGTH) 25-500 MG TABS Take 1 tablet by mouth at bedtime as needed.        . metoprolol (LOPRESSOR) 100 MG tablet Take 1 tablet (100 mg total) by mouth 2 (two) times daily.  180 tablet  3  . omeprazole (PRILOSEC) 20 MG capsule Take 20 mg by mouth daily.      . simvastatin (ZOCOR) 40 MG tablet Take 1 tablet (40 mg total) by mouth at bedtime.  90 tablet  3   No current facility-administered medications for this visit.    Review of Systems Review of Systems  Constitutional: Negative.   Respiratory: Negative.   Cardiovascular: Negative.     Blood pressure 140/84, pulse 84, resp. rate 14, height 5\' 5"  (1.651 m), weight 290 lb (131.543 kg).  Physical Exam Physical Exam  Constitutional: She is oriented to person, place,  and time. She appears well-developed and well-nourished.  Pulmonary/Chest:    Well healed port site.   Neurological: She is alert and oriented to person, place, and time.  Skin: Skin is warm and dry.  Right breast wide excision site is healing well. Axillary incision well-healed.  Data Reviewed None.  Assessment    Tolerating chemotherapy well.     Plan    We'll plan for a followup examination in 2 months.        [name] 08/28/2013, 10:21 PM

## 2013-08-31 LAB — CBC CANCER CENTER
Basophil #: 0 x10 3/mm (ref 0.0–0.1)
Basophil %: 1.2 %
Eosinophil #: 0.1 x10 3/mm (ref 0.0–0.7)
Eosinophil %: 4 %
HCT: 41.2 % (ref 35.0–47.0)
HGB: 13.6 g/dL (ref 12.0–16.0)
Lymphocyte #: 1.2 x10 3/mm (ref 1.0–3.6)
Lymphocyte %: 83 %
MCH: 29.3 pg (ref 26.0–34.0)
MCHC: 32.9 g/dL (ref 32.0–36.0)
MCV: 89 fL (ref 80–100)
Monocyte #: 0.1 x10 3/mm — ABNORMAL LOW (ref 0.2–0.9)
Monocyte %: 4.6 %
Neutrophil #: 0.1 x10 3/mm — ABNORMAL LOW (ref 1.4–6.5)
Neutrophil %: 7.2 %
Platelet: 194 x10 3/mm (ref 150–440)
RBC: 4.63 10*6/uL (ref 3.80–5.20)
RDW: 13.3 % (ref 11.5–14.5)
WBC: 1.4 x10 3/mm — CL (ref 3.6–11.0)

## 2013-09-01 ENCOUNTER — Emergency Department: Payer: Self-pay | Admitting: Emergency Medicine

## 2013-09-01 LAB — URINALYSIS, COMPLETE
Bilirubin,UR: NEGATIVE
Blood: NEGATIVE
Glucose,UR: NEGATIVE mg/dL (ref 0–75)
Ketone: NEGATIVE
Leukocyte Esterase: NEGATIVE
Nitrite: NEGATIVE
Ph: 5 (ref 4.5–8.0)
Protein: NEGATIVE
RBC,UR: 1 /HPF (ref 0–5)
Specific Gravity: 1.019 (ref 1.003–1.030)
Squamous Epithelial: 9
WBC UR: 1 /HPF (ref 0–5)

## 2013-09-01 LAB — CBC WITH DIFFERENTIAL/PLATELET
Basophil: 1 %
Eosinophil: 4 %
HCT: 39.2 %
HGB: 13.6 g/dL
Lymphocytes: 72 %
MCH: 30 pg
MCHC: 34.6 g/dL
MCV: 87 fL
Monocytes: 13 %
Platelet: 232 x10 3/mm 3
RBC: 4.52 X10 6/mm 3
RDW: 13.1 %
Segmented Neutrophils: 1 %
Variant Lymphocyte - H1-Rlymph: 9 %
WBC: 1.4 x10 3/mm 3 — CL

## 2013-09-01 LAB — COMPREHENSIVE METABOLIC PANEL WITH GFR
Albumin: 3.4 g/dL
Alkaline Phosphatase: 93 U/L
Anion Gap: 4 — ABNORMAL LOW
BUN: 5 mg/dL — ABNORMAL LOW
Bilirubin,Total: 0.6 mg/dL
Calcium, Total: 9.3 mg/dL
Chloride: 102 mmol/L
Co2: 29 mmol/L
Creatinine: 0.92 mg/dL
EGFR (African American): >60
EGFR (Non-African Amer.): >60
Glucose: 140 mg/dL — ABNORMAL HIGH
Osmolality: 270
Potassium: 4 mmol/L
SGOT(AST): 37 U/L
SGPT (ALT): 39 U/L
Sodium: 135 mmol/L — ABNORMAL LOW
Total Protein: 7.4 g/dL

## 2013-09-01 LAB — MAGNESIUM: Magnesium: 1.5 mg/dL — ABNORMAL LOW

## 2013-09-01 LAB — PHOSPHORUS: Phosphorus: 2.2 mg/dL — ABNORMAL LOW (ref 2.5–4.9)

## 2013-09-01 LAB — PROTIME-INR
INR: 1.1
Prothrombin Time: 14.5 secs (ref 11.5–14.7)

## 2013-09-03 LAB — URINE CULTURE

## 2013-09-06 LAB — CULTURE, BLOOD (SINGLE)

## 2013-09-07 LAB — CBC CANCER CENTER
Basophil #: 0.1 x10 3/mm (ref 0.0–0.1)
Basophil %: 0.9 %
Eosinophil #: 0 x10 3/mm (ref 0.0–0.7)
Eosinophil %: 0 %
HCT: 42.2 % (ref 35.0–47.0)
HGB: 13.9 g/dL (ref 12.0–16.0)
Lymphocyte #: 2.8 x10 3/mm (ref 1.0–3.6)
Lymphocyte %: 21.2 %
MCH: 29.2 pg (ref 26.0–34.0)
MCHC: 32.9 g/dL (ref 32.0–36.0)
MCV: 89 fL (ref 80–100)
Monocyte #: 1.7 x10 3/mm — ABNORMAL HIGH (ref 0.2–0.9)
Monocyte %: 13.1 %
Neutrophil #: 8.6 x10 3/mm — ABNORMAL HIGH (ref 1.4–6.5)
Neutrophil %: 64.8 %
Platelet: 334 x10 3/mm (ref 150–440)
RBC: 4.75 10*6/uL (ref 3.80–5.20)
RDW: 13.5 % (ref 11.5–14.5)
WBC: 13.2 x10 3/mm — ABNORMAL HIGH (ref 3.6–11.0)

## 2013-09-14 LAB — COMPREHENSIVE METABOLIC PANEL
Albumin: 3.4 g/dL (ref 3.4–5.0)
Alkaline Phosphatase: 87 U/L (ref 50–136)
Anion Gap: 9 (ref 7–16)
BUN: 18 mg/dL (ref 7–18)
Bilirubin,Total: 0.2 mg/dL (ref 0.2–1.0)
Calcium, Total: 9.3 mg/dL (ref 8.5–10.1)
Chloride: 102 mmol/L (ref 98–107)
Co2: 28 mmol/L (ref 21–32)
Creatinine: 0.91 mg/dL (ref 0.60–1.30)
EGFR (African American): >60
EGFR (Non-African Amer.): >60
Glucose: 195 mg/dL — ABNORMAL HIGH (ref 65–99)
Osmolality: 285 (ref 275–301)
Potassium: 4.1 mmol/L (ref 3.5–5.1)
SGOT(AST): 18 U/L (ref 15–37)
SGPT (ALT): 24 U/L (ref 12–78)
Sodium: 139 mmol/L (ref 136–145)
Total Protein: 7.6 g/dL (ref 6.4–8.2)

## 2013-09-14 LAB — CBC CANCER CENTER
Basophil #: 0.1 x10 3/mm (ref 0.0–0.1)
Basophil %: 0.3 %
Eosinophil #: 0 x10 3/mm (ref 0.0–0.7)
Eosinophil %: 0 %
HCT: 41.4 % (ref 35.0–47.0)
HGB: 13.4 g/dL (ref 12.0–16.0)
Lymphocyte #: 1.8 x10 3/mm (ref 1.0–3.6)
Lymphocyte %: 6 %
MCH: 28.7 pg (ref 26.0–34.0)
MCHC: 32.4 g/dL (ref 32.0–36.0)
MCV: 88 fL (ref 80–100)
Monocyte #: 0.4 x10 3/mm (ref 0.2–0.9)
Monocyte %: 1.2 %
Neutrophil #: 28.7 x10 3/mm — ABNORMAL HIGH (ref 1.4–6.5)
Neutrophil %: 92.5 %
Platelet: 318 x10 3/mm (ref 150–440)
RBC: 4.68 10*6/uL (ref 3.80–5.20)
RDW: 13.7 % (ref 11.5–14.5)
WBC: 31 x10 3/mm — ABNORMAL HIGH (ref 3.6–11.0)

## 2013-09-25 ENCOUNTER — Ambulatory Visit: Payer: Self-pay | Admitting: Hematology and Oncology

## 2013-09-25 LAB — CBC CANCER CENTER
Basophil #: 0.4 x10 3/mm — ABNORMAL HIGH (ref 0.0–0.1)
Basophil %: 0.9 %
Eosinophil #: 0.1 x10 3/mm (ref 0.0–0.7)
Eosinophil %: 0.3 %
HCT: 43.4 % (ref 35.0–47.0)
HGB: 14.2 g/dL (ref 12.0–16.0)
Lymphocyte #: 2.9 x10 3/mm (ref 1.0–3.6)
Lymphocyte %: 6.8 %
MCH: 29.3 pg (ref 26.0–34.0)
MCHC: 32.7 g/dL (ref 32.0–36.0)
MCV: 90 fL (ref 80–100)
Monocyte #: 0.3 x10 3/mm (ref 0.2–0.9)
Monocyte %: 0.7 %
Neutrophil #: 39 x10 3/mm — ABNORMAL HIGH (ref 1.4–6.5)
Neutrophil %: 91.3 %
Platelet: 125 x10 3/mm — ABNORMAL LOW (ref 150–440)
RBC: 4.84 10*6/uL (ref 3.80–5.20)
RDW: 14.6 % — ABNORMAL HIGH (ref 11.5–14.5)
WBC: 42.7 x10 3/mm — ABNORMAL HIGH (ref 3.6–11.0)

## 2013-10-05 LAB — COMPREHENSIVE METABOLIC PANEL
Albumin: 3.3 g/dL — ABNORMAL LOW (ref 3.4–5.0)
Alkaline Phosphatase: 77 U/L
Anion Gap: 6 — ABNORMAL LOW (ref 7–16)
BUN: 13 mg/dL (ref 7–18)
Bilirubin,Total: 0.2 mg/dL (ref 0.2–1.0)
Calcium, Total: 9 mg/dL (ref 8.5–10.1)
Chloride: 104 mmol/L (ref 98–107)
Co2: 30 mmol/L (ref 21–32)
Creatinine: 0.86 mg/dL (ref 0.60–1.30)
EGFR (African American): >60
EGFR (Non-African Amer.): >60
Glucose: 201 mg/dL — ABNORMAL HIGH (ref 65–99)
Osmolality: 285 (ref 275–301)
Potassium: 4.3 mmol/L (ref 3.5–5.1)
SGOT(AST): 22 U/L (ref 15–37)
SGPT (ALT): 58 U/L (ref 12–78)
Sodium: 140 mmol/L (ref 136–145)
Total Protein: 6.6 g/dL (ref 6.4–8.2)

## 2013-10-05 LAB — CBC CANCER CENTER
Basophil #: 0.1 x10 3/mm (ref 0.0–0.1)
Basophil %: 0.6 %
Eosinophil #: 0 x10 3/mm (ref 0.0–0.7)
Eosinophil %: 0 %
HCT: 38.8 % (ref 35.0–47.0)
HGB: 12.7 g/dL (ref 12.0–16.0)
Lymphocyte #: 1.5 x10 3/mm (ref 1.0–3.6)
Lymphocyte %: 6.9 %
MCH: 29.2 pg (ref 26.0–34.0)
MCHC: 32.8 g/dL (ref 32.0–36.0)
MCV: 89 fL (ref 80–100)
Monocyte #: 0.6 x10 3/mm (ref 0.2–0.9)
Monocyte %: 2.5 %
Neutrophil #: 19.9 x10 3/mm — ABNORMAL HIGH (ref 1.4–6.5)
Neutrophil %: 90 %
Platelet: 346 x10 3/mm (ref 150–440)
RBC: 4.36 10*6/uL (ref 3.80–5.20)
RDW: 15.1 % — ABNORMAL HIGH (ref 11.5–14.5)
WBC: 22.2 x10 3/mm — ABNORMAL HIGH (ref 3.6–11.0)

## 2013-10-12 LAB — CBC CANCER CENTER
Basophil #: 0 x10 3/mm (ref 0.0–0.1)
Basophil %: 0 %
Eosinophil #: 0.1 x10 3/mm (ref 0.0–0.7)
Eosinophil %: 3.3 %
HCT: 37.5 % (ref 35.0–47.0)
HGB: 12.4 g/dL (ref 12.0–16.0)
Lymphocyte #: 0.9 x10 3/mm — ABNORMAL LOW (ref 1.0–3.6)
Lymphocyte %: 39.2 %
MCH: 29.5 pg (ref 26.0–34.0)
MCHC: 33.2 g/dL (ref 32.0–36.0)
MCV: 89 fL (ref 80–100)
Monocyte #: 0.9 x10 3/mm (ref 0.2–0.9)
Monocyte %: 41.9 %
Neutrophil #: 0.3 x10 3/mm — ABNORMAL LOW (ref 1.4–6.5)
Neutrophil %: 15.6 %
Platelet: 93 x10 3/mm — ABNORMAL LOW (ref 150–440)
RBC: 4.22 10*6/uL (ref 3.80–5.20)
RDW: 16.1 % — ABNORMAL HIGH (ref 11.5–14.5)
WBC: 2.2 x10 3/mm — ABNORMAL LOW (ref 3.6–11.0)

## 2013-10-26 ENCOUNTER — Ambulatory Visit: Payer: Self-pay | Admitting: Hematology and Oncology

## 2013-10-27 LAB — COMPREHENSIVE METABOLIC PANEL
Albumin: 3.1 g/dL — ABNORMAL LOW (ref 3.4–5.0)
Alkaline Phosphatase: 65 U/L
Anion Gap: 12 (ref 7–16)
BUN: 9 mg/dL (ref 7–18)
Bilirubin,Total: 0.4 mg/dL (ref 0.2–1.0)
Calcium, Total: 9 mg/dL (ref 8.5–10.1)
Chloride: 104 mmol/L (ref 98–107)
Co2: 27 mmol/L (ref 21–32)
Creatinine: 1.06 mg/dL (ref 0.60–1.30)
EGFR (African American): >60
EGFR (Non-African Amer.): 57 — ABNORMAL LOW
Glucose: 191 mg/dL — ABNORMAL HIGH (ref 65–99)
Osmolality: 289 (ref 275–301)
Potassium: 3.9 mmol/L (ref 3.5–5.1)
SGOT(AST): 29 U/L (ref 15–37)
SGPT (ALT): 46 U/L (ref 12–78)
Sodium: 143 mmol/L (ref 136–145)
Total Protein: 6.1 g/dL — ABNORMAL LOW (ref 6.4–8.2)

## 2013-10-27 LAB — CBC CANCER CENTER
Basophil #: 0 x10 3/mm (ref 0.0–0.1)
Basophil %: 0.2 %
Eosinophil #: 0 x10 3/mm (ref 0.0–0.7)
Eosinophil %: 0 %
HCT: 36.3 % (ref 35.0–47.0)
HGB: 11.8 g/dL — ABNORMAL LOW (ref 12.0–16.0)
Lymphocyte #: 1.4 x10 3/mm (ref 1.0–3.6)
Lymphocyte %: 13.8 %
MCH: 29.7 pg (ref 26.0–34.0)
MCHC: 32.6 g/dL (ref 32.0–36.0)
MCV: 91 fL (ref 80–100)
Monocyte #: 0.4 x10 3/mm (ref 0.2–0.9)
Monocyte %: 3.5 %
Neutrophil #: 8.4 x10 3/mm — ABNORMAL HIGH (ref 1.4–6.5)
Neutrophil %: 82.5 %
Platelet: 242 x10 3/mm (ref 150–440)
RBC: 3.99 10*6/uL (ref 3.80–5.20)
RDW: 17.6 % — ABNORMAL HIGH (ref 11.5–14.5)
WBC: 10.2 x10 3/mm (ref 3.6–11.0)

## 2013-11-02 ENCOUNTER — Ambulatory Visit: Payer: BC Managed Care – PPO | Admitting: General Surgery

## 2013-11-02 LAB — CBC CANCER CENTER
Basophil #: 0 x10 3/mm (ref 0.0–0.1)
Basophil %: 1.3 %
Eosinophil #: 0.1 x10 3/mm (ref 0.0–0.7)
Eosinophil %: 7 %
HCT: 37 % (ref 35.0–47.0)
HGB: 12.1 g/dL (ref 12.0–16.0)
Lymphocyte #: 0.5 x10 3/mm — ABNORMAL LOW (ref 1.0–3.6)
Lymphocyte %: 68.9 %
MCH: 29.9 pg (ref 26.0–34.0)
MCHC: 32.6 g/dL (ref 32.0–36.0)
MCV: 92 fL (ref 80–100)
Monocyte #: 0 x10 3/mm — ABNORMAL LOW (ref 0.2–0.9)
Monocyte %: 3.3 %
Neutrophil #: 0.1 x10 3/mm — ABNORMAL LOW (ref 1.4–6.5)
Neutrophil %: 19.5 %
Platelet: 74 x10 3/mm — ABNORMAL LOW (ref 150–440)
RBC: 4.03 10*6/uL (ref 3.80–5.20)
RDW: 17.9 % — ABNORMAL HIGH (ref 11.5–14.5)
WBC: 0.8 x10 3/mm — CL (ref 3.6–11.0)

## 2013-11-09 LAB — CBC CANCER CENTER
Basophil #: 0.2 x10 3/mm — ABNORMAL HIGH (ref 0.0–0.1)
Basophil %: 1 %
Eosinophil #: 0 x10 3/mm (ref 0.0–0.7)
Eosinophil %: 0 %
HCT: 38.5 % (ref 35.0–47.0)
HGB: 12.4 g/dL (ref 12.0–16.0)
Lymphocyte #: 1.8 x10 3/mm (ref 1.0–3.6)
Lymphocyte %: 8.4 %
MCH: 30 pg (ref 26.0–34.0)
MCHC: 32.3 g/dL (ref 32.0–36.0)
MCV: 93 fL (ref 80–100)
Monocyte #: 0.9 x10 3/mm (ref 0.2–0.9)
Monocyte %: 4.2 %
Neutrophil #: 18.7 x10 3/mm — ABNORMAL HIGH (ref 1.4–6.5)
Neutrophil %: 86.4 %
Platelet: 142 x10 3/mm — ABNORMAL LOW (ref 150–440)
RBC: 4.15 10*6/uL (ref 3.80–5.20)
RDW: 18.8 % — ABNORMAL HIGH (ref 11.5–14.5)
WBC: 21.7 x10 3/mm — ABNORMAL HIGH (ref 3.6–11.0)

## 2013-11-13 ENCOUNTER — Encounter: Payer: Self-pay | Admitting: General Surgery

## 2013-11-13 ENCOUNTER — Ambulatory Visit (INDEPENDENT_AMBULATORY_CARE_PROVIDER_SITE_OTHER): Payer: BC Managed Care – PPO | Admitting: General Surgery

## 2013-11-13 VITALS — BP 130/72 | HR 78 | Resp 16 | Ht 65.0 in | Wt 275.0 lb

## 2013-11-13 DIAGNOSIS — I890 Lymphedema, not elsewhere classified: Secondary | ICD-10-CM | POA: Insufficient documentation

## 2013-11-13 DIAGNOSIS — C50311 Malignant neoplasm of lower-inner quadrant of right female breast: Secondary | ICD-10-CM

## 2013-11-13 DIAGNOSIS — C50319 Malignant neoplasm of lower-inner quadrant of unspecified female breast: Secondary | ICD-10-CM

## 2013-11-13 NOTE — Patient Instructions (Addendum)
Patient to return in 2 months with a right breast diagnostic mammogram. Patient to call with any questions or concerns.   This patient will be referred to [facility] for right upper extremity edema.

## 2013-11-13 NOTE — Progress Notes (Signed)
Patient ID: Tina Delgado, female   DOB: 1956/09/25, 58 y.o.   MRN: [id number]  Chief Complaint  Patient presents with  . Follow-up    2 month follow up breast no mammogram    HPI Tina Delgado is a 58 y.o. female who presents for a 4 month follow up breast evaluation. The patient denies any new complaints at this time.  The patient completed her radiation therapy in November. HPI  Past Medical History  Diagnosis Date  . Hyperlipidemia   . Hypertension   . Morbid obesity   . Elevated glucose 05/2003    106  . Allergy   . Arthritis     s/p knee injection per ortho  . Mammographic microcalcification   . Malignant neoplasm of upper-outer quadrant of female breast July 20, 2013    histologic grade 3 invasive mammary carcinoma, 13 mm,  2/16 nodes positive, ER-positive, PR positive, HER-2/neu not amplified.Y4I,H4V    Past Surgical History  Procedure Laterality Date  . Knee arthroscopy  03/04/2007    left Dr Derrel Nip  . Joint replacement  07/30/2007    LTKR Dr Derrel Nip  . Tubal ligation  ~1986    BTL  . Colonoscopy  2007  . Breast surgery Right 07-20-13    Wide excision, SLN biopsy, axillary dissection.  . Portacath placement  2014    Family History  Problem Relation Age of Onset  . Heart disease Mother     MI  . Depression Mother     paranoia  . Hypertension Mother   . Stroke Mother   . Heart disease Brother 57    MI PTCA at 34  . Heart disease Father   . Colon cancer Neg Hx   . Breast cancer Neg Hx   . Stomach cancer Maternal Grandfather 82    Social History History  Substance Use Topics  . Smoking status: Never Smoker   . Smokeless tobacco: Never Used  . Alcohol Use: Yes     Comment: once a week    Allergies  Allergen Reactions  . Lisinopril Cough  . Sulfonamide Derivatives     REACTION: itching years ago    Current Outpatient Prescriptions  Medication Sig Dispense Refill  . acetaminophen (TYLENOL) 500 MG tablet Take 500 mg by mouth  every 6 (six) hours as needed.      [name] dexamethasone (DECADRON) 4 MG tablet Take 1 tablet by mouth 3 (three) times daily as needed.      . diphenhydrAMINE (BENADRYL) 25 MG tablet Take 25 mg by mouth every 6 (six) hours as needed for itching.      . fluconazole (DIFLUCAN) 100 MG tablet Take 1 tablet by mouth 3 (three) times daily.      [name] ibuprofen (ADVIL,MOTRIN) 200 MG tablet Take 200 mg by mouth every 6 (six) hours as needed.      . loratadine (CLARITIN) 10 MG tablet Take 10 mg by mouth daily.      . magnesium oxide (MAG-OX) 400 MG tablet Take 400 mg by mouth daily.      . metoprolol (LOPRESSOR) 100 MG tablet Take 1 tablet (100 mg total) by mouth 2 (two) times daily.  180 tablet  3  . nystatin ointment (MYCOSTATIN)       . omeprazole (PRILOSEC) 20 MG capsule Take 20 mg by mouth daily.      . prochlorperazine (COMPAZINE) 10 MG tablet Take 10 mg by mouth every 6 (six) hours as needed for nausea or vomiting.      [name]  simvastatin (ZOCOR) 40 MG tablet Take 1 tablet (40 mg total) by mouth at bedtime.  90 tablet  3   No current facility-administered medications for this visit.    Review of Systems Review of Systems  Constitutional: Negative.   Respiratory: Negative.   Cardiovascular: Negative.     Blood pressure 130/72, pulse 78, resp. rate 16, height 5' 5"  (1.651 m), weight 275 lb (124.739 kg).  Physical Exam Physical Exam  Constitutional: She is oriented to person, place, and time. She appears well-developed and well-nourished.  Cardiovascular: Normal rate, regular rhythm and normal heart sounds.   No murmur heard. Pulmonary/Chest: Effort normal and breath sounds normal. Right breast exhibits no inverted nipple, no mass, no nipple discharge, no skin change and no tenderness. Left breast exhibits no inverted nipple, no mass, no nipple discharge, no skin change and no tenderness.    Right breast well healed incision at 3 o'clock and in axilla.   Musculoskeletal:       Arms: Lymphadenopathy:     She has no cervical adenopathy.    She has no axillary adenopathy.  Neurological: She is alert and oriented to person, place, and time.  Skin: Skin is warm and dry.    Data Reviewed Upper extremity measurements.  Assessment    Early forearm greater than and lymphedema, right.  No evidence of shoulder limitation of motion status post wide excision and radiation.      Plan    The role of early assessment for lymphedema was discussed. She was amenable for physical therapy assessment.     Patient has been scheduled for a unilateral right breast diagnostic mammogram for 01-19-14 at 9:20 am. She will be asked to follow up with the office in April 2015.  This patient will also be referred to [facility] (phone: [phone]) for right upper extremity edema. Order will be forwarded to [facility] (fax: [phone]) for this and they will contact patient to arrange a day and time.    Robert Bellow 11/13/2013, 8:45 PM

## 2013-11-16 LAB — COMPREHENSIVE METABOLIC PANEL
Albumin: 3.1 g/dL — ABNORMAL LOW (ref 3.4–5.0)
Alkaline Phosphatase: 76 U/L
Anion Gap: 8 (ref 7–16)
BUN: 12 mg/dL (ref 7–18)
Bilirubin,Total: 0.3 mg/dL (ref 0.2–1.0)
Calcium, Total: 8.6 mg/dL (ref 8.5–10.1)
Chloride: 105 mmol/L (ref 98–107)
Co2: 28 mmol/L (ref 21–32)
Creatinine: 1.02 mg/dL (ref 0.60–1.30)
EGFR (African American): >60
EGFR (Non-African Amer.): >60
Glucose: 211 mg/dL — ABNORMAL HIGH (ref 65–99)
Osmolality: 287 (ref 275–301)
Potassium: 4 mmol/L (ref 3.5–5.1)
SGOT(AST): 23 U/L (ref 15–37)
SGPT (ALT): 32 U/L (ref 12–78)
Sodium: 141 mmol/L (ref 136–145)
Total Protein: 6 g/dL — ABNORMAL LOW (ref 6.4–8.2)

## 2013-11-16 LAB — CBC CANCER CENTER
Basophil #: 0.3 x10 3/mm — ABNORMAL HIGH (ref 0.0–0.1)
Basophil %: 1.1 %
Eosinophil #: 0 x10 3/mm (ref 0.0–0.7)
Eosinophil %: 0 %
HCT: 33.9 % — ABNORMAL LOW (ref 35.0–47.0)
HGB: 10.9 g/dL — ABNORMAL LOW (ref 12.0–16.0)
Lymphocyte #: 1.5 x10 3/mm (ref 1.0–3.6)
Lymphocyte %: 5.9 %
MCH: 30.3 pg (ref 26.0–34.0)
MCHC: 32.2 g/dL (ref 32.0–36.0)
MCV: 94 fL (ref 80–100)
Monocyte #: 0.6 x10 3/mm (ref 0.2–0.9)
Monocyte %: 2.6 %
Neutrophil #: 22.2 x10 3/mm — ABNORMAL HIGH (ref 1.4–6.5)
Neutrophil %: 90.4 %
Platelet: 243 x10 3/mm (ref 150–440)
RBC: 3.6 10*6/uL — ABNORMAL LOW (ref 3.80–5.20)
RDW: 19.2 % — ABNORMAL HIGH (ref 11.5–14.5)
WBC: 24.6 x10 3/mm — ABNORMAL HIGH (ref 3.6–11.0)

## 2013-11-23 ENCOUNTER — Encounter: Payer: Self-pay | Admitting: General Surgery

## 2013-11-26 ENCOUNTER — Encounter: Payer: Self-pay | Admitting: General Surgery

## 2013-11-26 ENCOUNTER — Ambulatory Visit: Payer: Self-pay | Admitting: Hematology and Oncology

## 2013-12-14 LAB — COMPREHENSIVE METABOLIC PANEL
Albumin: 3.1 g/dL — ABNORMAL LOW (ref 3.4–5.0)
Alkaline Phosphatase: 84 U/L
Anion Gap: 4 — ABNORMAL LOW (ref 7–16)
BUN: 7 mg/dL (ref 7–18)
Bilirubin,Total: 0.3 mg/dL (ref 0.2–1.0)
Calcium, Total: 8 mg/dL — ABNORMAL LOW (ref 8.5–10.1)
Chloride: 106 mmol/L (ref 98–107)
Co2: 35 mmol/L — ABNORMAL HIGH (ref 21–32)
Creatinine: 0.92 mg/dL (ref 0.60–1.30)
EGFR (African American): >60
EGFR (Non-African Amer.): >60
Glucose: 124 mg/dL — ABNORMAL HIGH (ref 65–99)
Osmolality: 288 (ref 275–301)
Potassium: 4.3 mmol/L (ref 3.5–5.1)
SGOT(AST): 21 U/L (ref 15–37)
SGPT (ALT): 26 U/L (ref 12–78)
Sodium: 145 mmol/L (ref 136–145)
Total Protein: 6.3 g/dL — ABNORMAL LOW (ref 6.4–8.2)

## 2013-12-14 LAB — CBC CANCER CENTER
Basophil #: 0.1 x10 3/mm (ref 0.0–0.1)
Basophil %: 1.4 %
Eosinophil #: 0.5 x10 3/mm (ref 0.0–0.7)
Eosinophil %: 5.1 %
HCT: 38.7 % (ref 35.0–47.0)
HGB: 12.8 g/dL (ref 12.0–16.0)
Lymphocyte #: 2.4 x10 3/mm (ref 1.0–3.6)
Lymphocyte %: 26.4 %
MCH: 31.5 pg (ref 26.0–34.0)
MCHC: 32.9 g/dL (ref 32.0–36.0)
MCV: 96 fL (ref 80–100)
Monocyte #: 0.6 x10 3/mm (ref 0.2–0.9)
Monocyte %: 6.8 %
Neutrophil #: 5.6 x10 3/mm (ref 1.4–6.5)
Neutrophil %: 60.3 %
Platelet: 265 x10 3/mm (ref 150–440)
RBC: 4.05 10*6/uL (ref 3.80–5.20)
RDW: 14.1 % (ref 11.5–14.5)
WBC: 9.2 x10 3/mm (ref 3.6–11.0)

## 2013-12-14 LAB — MAGNESIUM: Magnesium: 1.9 mg/dL

## 2013-12-20 LAB — CBC CANCER CENTER
Basophil #: 0.1 x10 3/mm (ref 0.0–0.1)
Basophil %: 0.8 %
Eosinophil #: 0.3 x10 3/mm (ref 0.0–0.7)
Eosinophil %: 2.7 %
HCT: 39.8 % (ref 35.0–47.0)
HGB: 12.9 g/dL (ref 12.0–16.0)
Lymphocyte #: 2.3 x10 3/mm (ref 1.0–3.6)
Lymphocyte %: 23.6 %
MCH: 30.8 pg (ref 26.0–34.0)
MCHC: 32.4 g/dL (ref 32.0–36.0)
MCV: 95 fL (ref 80–100)
Monocyte #: 0.7 x10 3/mm (ref 0.2–0.9)
Monocyte %: 7.6 %
Neutrophil #: 6.3 x10 3/mm (ref 1.4–6.5)
Neutrophil %: 65.3 %
Platelet: 259 x10 3/mm (ref 150–440)
RBC: 4.19 10*6/uL (ref 3.80–5.20)
RDW: 14.1 % (ref 11.5–14.5)
WBC: 9.7 x10 3/mm (ref 3.6–11.0)

## 2013-12-24 ENCOUNTER — Ambulatory Visit: Payer: Self-pay | Admitting: Hematology and Oncology

## 2013-12-24 ENCOUNTER — Encounter: Payer: Self-pay | Admitting: General Surgery

## 2013-12-28 LAB — CBC CANCER CENTER
Basophil #: 0.1 x10 3/mm (ref 0.0–0.1)
Basophil %: 1.2 %
Eosinophil #: 0.3 x10 3/mm (ref 0.0–0.7)
Eosinophil %: 3.7 %
HCT: 39.8 % (ref 35.0–47.0)
HGB: 13.1 g/dL (ref 12.0–16.0)
Lymphocyte #: 2 x10 3/mm (ref 1.0–3.6)
Lymphocyte %: 26.5 %
MCH: 30.7 pg (ref 26.0–34.0)
MCHC: 33 g/dL (ref 32.0–36.0)
MCV: 93 fL (ref 80–100)
Monocyte #: 0.7 x10 3/mm (ref 0.2–0.9)
Monocyte %: 9.2 %
Neutrophil #: 4.4 x10 3/mm (ref 1.4–6.5)
Neutrophil %: 59.4 %
Platelet: 226 x10 3/mm (ref 150–440)
RBC: 4.27 10*6/uL (ref 3.80–5.20)
RDW: 13.4 % (ref 11.5–14.5)
WBC: 7.5 x10 3/mm (ref 3.6–11.0)

## 2014-01-04 LAB — CBC CANCER CENTER
Basophil #: 0.1 x10 3/mm (ref 0.0–0.1)
Basophil %: 1.1 %
Eosinophil #: 0.3 x10 3/mm (ref 0.0–0.7)
Eosinophil %: 4.7 %
HCT: 41.5 % (ref 35.0–47.0)
HGB: 13.7 g/dL (ref 12.0–16.0)
Lymphocyte #: 1.4 x10 3/mm (ref 1.0–3.6)
Lymphocyte %: 18.6 %
MCH: 30.7 pg (ref 26.0–34.0)
MCHC: 33 g/dL (ref 32.0–36.0)
MCV: 93 fL (ref 80–100)
Monocyte #: 0.7 x10 3/mm (ref 0.2–0.9)
Monocyte %: 9.8 %
Neutrophil #: 4.8 x10 3/mm (ref 1.4–6.5)
Neutrophil %: 65.8 %
Platelet: 225 x10 3/mm (ref 150–440)
RBC: 4.48 10*6/uL (ref 3.80–5.20)
RDW: 13.7 % (ref 11.5–14.5)
WBC: 7.3 x10 3/mm (ref 3.6–11.0)

## 2014-01-11 LAB — CBC CANCER CENTER
Basophil #: 0.1 x10 3/mm (ref 0.0–0.1)
Basophil %: 1.1 %
Eosinophil #: 0.3 x10 3/mm (ref 0.0–0.7)
Eosinophil %: 4.5 %
HCT: 43.6 % (ref 35.0–47.0)
HGB: 14.4 g/dL (ref 12.0–16.0)
Lymphocyte #: 1.4 x10 3/mm (ref 1.0–3.6)
Lymphocyte %: 18.9 %
MCH: 30.2 pg (ref 26.0–34.0)
MCHC: 33.1 g/dL (ref 32.0–36.0)
MCV: 91 fL (ref 80–100)
Monocyte #: 0.5 x10 3/mm (ref 0.2–0.9)
Monocyte %: 6.5 %
Neutrophil #: 5.1 x10 3/mm (ref 1.4–6.5)
Neutrophil %: 69 %
Platelet: 215 x10 3/mm (ref 150–440)
RBC: 4.77 10*6/uL (ref 3.80–5.20)
RDW: 13.4 % (ref 11.5–14.5)
WBC: 7.3 x10 3/mm (ref 3.6–11.0)

## 2014-01-16 LAB — CBC CANCER CENTER
Basophil #: 0.1 x10 3/mm (ref 0.0–0.1)
Basophil %: 0.8 %
Eosinophil #: 0.3 x10 3/mm (ref 0.0–0.7)
Eosinophil %: 3.8 %
HCT: 43.5 % (ref 35.0–47.0)
HGB: 14.3 g/dL (ref 12.0–16.0)
Lymphocyte #: 1.1 x10 3/mm (ref 1.0–3.6)
Lymphocyte %: 16.6 %
MCH: 30 pg (ref 26.0–34.0)
MCHC: 32.9 g/dL (ref 32.0–36.0)
MCV: 91 fL (ref 80–100)
Monocyte #: 0.5 x10 3/mm (ref 0.2–0.9)
Monocyte %: 6.9 %
Neutrophil #: 4.9 x10 3/mm (ref 1.4–6.5)
Neutrophil %: 71.9 %
Platelet: 214 x10 3/mm (ref 150–440)
RBC: 4.78 10*6/uL (ref 3.80–5.20)
RDW: 13.5 % (ref 11.5–14.5)
WBC: 6.9 x10 3/mm (ref 3.6–11.0)

## 2014-01-16 LAB — COMPREHENSIVE METABOLIC PANEL
Albumin: 3.7 g/dL (ref 3.4–5.0)
Alkaline Phosphatase: 81 U/L
Anion Gap: 7 (ref 7–16)
BUN: 9 mg/dL (ref 7–18)
Bilirubin,Total: 0.3 mg/dL (ref 0.2–1.0)
Calcium, Total: 9.4 mg/dL (ref 8.5–10.1)
Chloride: 105 mmol/L (ref 98–107)
Co2: 32 mmol/L (ref 21–32)
Creatinine: 0.86 mg/dL (ref 0.60–1.30)
EGFR (African American): >60
EGFR (Non-African Amer.): >60
Glucose: 117 mg/dL — ABNORMAL HIGH (ref 65–99)
Osmolality: 287 (ref 275–301)
Potassium: 3.9 mmol/L (ref 3.5–5.1)
SGOT(AST): 20 U/L (ref 15–37)
SGPT (ALT): 23 U/L (ref 12–78)
Sodium: 144 mmol/L (ref 136–145)
Total Protein: 7.2 g/dL (ref 6.4–8.2)

## 2014-01-17 LAB — CANCER ANTIGEN 27.29: CA 27.29: 13.1 U/mL (ref 0.0–38.6)

## 2014-01-18 LAB — CBC CANCER CENTER
Basophil #: 0.1 x10 3/mm (ref 0.0–0.1)
Basophil %: 0.8 %
Eosinophil #: 0.3 x10 3/mm (ref 0.0–0.7)
Eosinophil %: 3.5 %
HCT: 42.7 % (ref 35.0–47.0)
HGB: 14 g/dL (ref 12.0–16.0)
Lymphocyte #: 1.3 x10 3/mm (ref 1.0–3.6)
Lymphocyte %: 17.1 %
MCH: 29.9 pg (ref 26.0–34.0)
MCHC: 32.8 g/dL (ref 32.0–36.0)
MCV: 91 fL (ref 80–100)
Monocyte #: 0.6 x10 3/mm (ref 0.2–0.9)
Monocyte %: 8.4 %
Neutrophil #: 5.3 x10 3/mm (ref 1.4–6.5)
Neutrophil %: 70.2 %
Platelet: 223 x10 3/mm (ref 150–440)
RBC: 4.69 10*6/uL (ref 3.80–5.20)
RDW: 13.3 % (ref 11.5–14.5)
WBC: 7.6 x10 3/mm (ref 3.6–11.0)

## 2014-01-24 ENCOUNTER — Encounter: Payer: Self-pay | Admitting: General Surgery

## 2014-01-24 ENCOUNTER — Ambulatory Visit: Payer: Self-pay | Admitting: Hematology and Oncology

## 2014-01-25 LAB — CBC CANCER CENTER
Basophil #: 0.1 x10 3/mm (ref 0.0–0.1)
Basophil %: 0.7 %
Eosinophil #: 0.3 x10 3/mm (ref 0.0–0.7)
Eosinophil %: 3.3 %
HCT: 43.7 % (ref 35.0–47.0)
HGB: 14.4 g/dL (ref 12.0–16.0)
Lymphocyte #: 1.4 x10 3/mm (ref 1.0–3.6)
Lymphocyte %: 15.9 %
MCH: 29.8 pg (ref 26.0–34.0)
MCHC: 33.1 g/dL (ref 32.0–36.0)
MCV: 90 fL (ref 80–100)
Monocyte #: 0.7 x10 3/mm (ref 0.2–0.9)
Monocyte %: 8.3 %
Neutrophil #: 6.2 x10 3/mm (ref 1.4–6.5)
Neutrophil %: 71.8 %
Platelet: 226 x10 3/mm (ref 150–440)
RBC: 4.84 10*6/uL (ref 3.80–5.20)
RDW: 13.4 % (ref 11.5–14.5)
WBC: 8.6 x10 3/mm (ref 3.6–11.0)

## 2014-01-29 ENCOUNTER — Ambulatory Visit: Payer: BC Managed Care – PPO | Admitting: General Surgery

## 2014-02-01 LAB — CBC CANCER CENTER
Basophil #: 0 x10 3/mm (ref 0.0–0.1)
Basophil %: 1 %
Eosinophil #: 0.3 x10 3/mm (ref 0.0–0.7)
Eosinophil %: 6.1 %
HCT: 45.2 % (ref 35.0–47.0)
HGB: 14.7 g/dL (ref 12.0–16.0)
Lymphocyte #: 0.8 x10 3/mm — ABNORMAL LOW (ref 1.0–3.6)
Lymphocyte %: 16.6 %
MCH: 29.3 pg (ref 26.0–34.0)
MCHC: 32.5 g/dL (ref 32.0–36.0)
MCV: 90 fL (ref 80–100)
Monocyte #: 0.5 x10 3/mm (ref 0.2–0.9)
Monocyte %: 10.2 %
Neutrophil #: 3.3 x10 3/mm (ref 1.4–6.5)
Neutrophil %: 66.1 %
Platelet: 180 x10 3/mm (ref 150–440)
RBC: 5.02 10*6/uL (ref 3.80–5.20)
RDW: 13.3 % (ref 11.5–14.5)
WBC: 5 x10 3/mm (ref 3.6–11.0)

## 2014-02-20 ENCOUNTER — Ambulatory Visit: Payer: Self-pay | Admitting: General Surgery

## 2014-02-21 ENCOUNTER — Encounter: Payer: Self-pay | Admitting: General Surgery

## 2014-02-21 LAB — CBC CANCER CENTER
Basophil #: 0.1 x10 3/mm (ref 0.0–0.1)
Basophil %: 1.3 %
Eosinophil #: 0.2 x10 3/mm (ref 0.0–0.7)
Eosinophil %: 3 %
HCT: 42.7 % (ref 35.0–47.0)
HGB: 14.7 g/dL (ref 12.0–16.0)
Lymphocyte #: 1.7 x10 3/mm (ref 1.0–3.6)
Lymphocyte %: 25.6 %
MCH: 29.5 pg (ref 26.0–34.0)
MCHC: 34.5 g/dL (ref 32.0–36.0)
MCV: 86 fL (ref 80–100)
Monocyte #: 0.5 x10 3/mm (ref 0.2–0.9)
Monocyte %: 8 %
Neutrophil #: 4.2 x10 3/mm (ref 1.4–6.5)
Neutrophil %: 62.1 %
Platelet: 182 x10 3/mm (ref 150–440)
RBC: 4.99 10*6/uL (ref 3.80–5.20)
RDW: 13.2 % (ref 11.5–14.5)
WBC: 6.7 x10 3/mm (ref 3.6–11.0)

## 2014-02-21 LAB — COMPREHENSIVE METABOLIC PANEL
Albumin: 3.5 g/dL (ref 3.4–5.0)
Alkaline Phosphatase: 82 U/L
Anion Gap: 5 — ABNORMAL LOW (ref 7–16)
BUN: 13 mg/dL (ref 7–18)
Bilirubin,Total: 0.3 mg/dL (ref 0.2–1.0)
Calcium, Total: 9.5 mg/dL (ref 8.5–10.1)
Chloride: 105 mmol/L (ref 98–107)
Co2: 33 mmol/L — ABNORMAL HIGH (ref 21–32)
Creatinine: 1.02 mg/dL (ref 0.60–1.30)
EGFR (African American): >60
EGFR (Non-African Amer.): >60
Glucose: 102 mg/dL — ABNORMAL HIGH (ref 65–99)
Osmolality: 285 (ref 275–301)
Potassium: 4.4 mmol/L (ref 3.5–5.1)
SGOT(AST): 16 U/L (ref 15–37)
SGPT (ALT): 25 U/L (ref 12–78)
Sodium: 143 mmol/L (ref 136–145)
Total Protein: 7.1 g/dL (ref 6.4–8.2)

## 2014-02-22 LAB — CANCER ANTIGEN 27.29: CA 27.29: 14.7 U/mL (ref 0.0–38.6)

## 2014-02-23 ENCOUNTER — Ambulatory Visit: Payer: Self-pay | Admitting: Hematology and Oncology

## 2014-03-01 ENCOUNTER — Encounter: Payer: Self-pay | Admitting: General Surgery

## 2014-03-01 ENCOUNTER — Ambulatory Visit (INDEPENDENT_AMBULATORY_CARE_PROVIDER_SITE_OTHER): Payer: BC Managed Care – PPO | Admitting: General Surgery

## 2014-03-01 VITALS — BP 128/76 | HR 74 | Resp 14 | Ht 64.0 in | Wt 263.0 lb

## 2014-03-01 DIAGNOSIS — I890 Lymphedema, not elsewhere classified: Secondary | ICD-10-CM

## 2014-03-01 DIAGNOSIS — C50319 Malignant neoplasm of lower-inner quadrant of unspecified female breast: Secondary | ICD-10-CM

## 2014-03-01 DIAGNOSIS — C50311 Malignant neoplasm of lower-inner quadrant of right female breast: Secondary | ICD-10-CM

## 2014-03-01 NOTE — Patient Instructions (Addendum)
Continue self breast exams. Call office for any new breast issues or concerns.  6 months bilateral diagnostic mammogram with right focal spot views and office visit.

## 2014-03-01 NOTE — Progress Notes (Signed)
Patient ID: Tina Delgado, female   DOB: 17-Apr-1956, 58 y.o.   MRN: [id number]  Chief Complaint  Patient presents with  . Follow-up    mammogram    HPI Tina Delgado is a 58 y.o. female who presents for her follow up and breast evaluation. The most recent mammogram was done on 01/25/14. Patient does perform regular self breast checks and gets regular mammograms done.   Radiation was completed 02-06-14. No new breast complaints. She has an appointment with Dr Baruch Gouty next week. Recovering from bronchitis and still has a cough. She wears her lymphedema sleeve at night.  She is asymptomatic during the day and reports scan swelling by the end of the evening. She is aware that should she appreciate any swelling use of the daytime sleeve would be appropriate.   HPI  Past Medical History  Diagnosis Date  . Hyperlipidemia   . Hypertension   . Morbid obesity   . Elevated glucose 05/2003    106  . Allergy   . Arthritis     s/p knee injection per ortho  . Mammographic microcalcification   . Malignant neoplasm of upper-outer quadrant of female breast July 20, 2013    histologic grade 3 invasive mammary carcinoma, 13 mm,  2/16 nodes positive, ER-positive, PR positive, HER-2/neu not amplified.X3K,G4W    Past Surgical History  Procedure Laterality Date  . Knee arthroscopy  03/04/2007    left Dr Derrel Nip  . Joint replacement  07/30/2007    LTKR Dr Derrel Nip  . Tubal ligation  ~1986    BTL  . Colonoscopy  2007  . Breast surgery Right 07-20-13    Wide excision, SLN biopsy, axillary dissection.  . Portacath placement  2014    Family History  Problem Relation Age of Onset  . Heart disease Mother     MI  . Depression Mother     paranoia  . Hypertension Mother   . Stroke Mother   . Heart disease Brother 27    MI PTCA at 17  . Heart disease Father   . Colon cancer Neg Hx   . Breast cancer Neg Hx   . Stomach cancer Maternal Grandfather 34    Social History History   Substance Use Topics  . Smoking status: Never Smoker   . Smokeless tobacco: Never Used  . Alcohol Use: Yes     Comment: once a week    Allergies  Allergen Reactions  . Lisinopril Cough  . Sulfonamide Derivatives     REACTION: itching years ago  . Latex Rash    tape    Current Outpatient Prescriptions  Medication Sig Dispense Refill  . acetaminophen (TYLENOL) 500 MG tablet Take 500 mg by mouth every 6 (six) hours as needed.      . chlorpheniramine-HYDROcodone (TUSSIONEX) 10-8 MG/5ML LQCR Take by mouth every 12 (twelve) hours as needed.       . diphenhydrAMINE (BENADRYL) 25 MG tablet Take 25 mg by mouth every 6 (six) hours as needed for itching.      . fluticasone (FLONASE) 50 MCG/ACT nasal spray Place 1 spray into both nostrils as needed.       [name] ibuprofen (ADVIL,MOTRIN) 200 MG tablet Take 200 mg by mouth every 6 (six) hours as needed.      [name] letrozole (FEMARA) 2.5 MG tablet Take 2.5 mg by mouth daily.      [name] loratadine (CLARITIN) 10 MG tablet Take 10 mg by mouth daily.      [name]  metoprolol (LOPRESSOR) 100 MG tablet Take 1 tablet (100 mg total) by mouth 2 (two) times daily.  180 tablet  3  . omeprazole (PRILOSEC) 20 MG capsule Take 20 mg by mouth daily.      . simvastatin (ZOCOR) 40 MG tablet Take 1 tablet (40 mg total) by mouth at bedtime.  90 tablet  3   No current facility-administered medications for this visit.    Review of Systems Review of Systems  Constitutional: Negative.   Respiratory: Positive for cough.   Cardiovascular: Negative.     Blood pressure 128/76, pulse 74, resp. rate 14, height 5' 4"  (1.626 m), weight 263 lb (119.296 kg).  Physical Exam Physical Exam  Constitutional: She is oriented to person, place, and time. She appears well-developed and well-nourished.  Eyes: Conjunctivae are normal.  Neck: Neck supple.  Cardiovascular: Normal rate, regular rhythm and normal heart sounds.   Pulmonary/Chest: Effort normal and breath sounds normal. Right breast  exhibits skin change. Right breast exhibits no inverted nipple, no mass, no nipple discharge and no tenderness. Left breast exhibits no inverted nipple, no mass, no nipple discharge, no skin change and no tenderness.  Small hemangioma upper inner quadrant left breast, unchanged Medial erythremia mild thickening of skin areolar at 2-4 o'clock position Mild changes in other areas of breast   Lymphadenopathy:    She has no cervical adenopathy.    She has no axillary adenopathy.  Neurological: She is alert and oriented to person, place, and time.  Skin: Skin is warm and dry.    Data Reviewed The patient's first posttreatment mammogram of the right breast dated 02/20/2014 showed evidence of fat necrosis in the axilla. In the 3:00 position there is a new cluster of calcifications better linear and punctate. These were not present on her pre-wide excision films. The finding may represent benign postsurgical changes were more sinister process. BI-RAD-4.  Assessment    Doing well status post wide excision, axillary dissection and postoperative radiation for a T1c,N1a carcinoma right breast.  Lymphedema well-controlled.      Plan    Options for management of a new mammographic findings were reviewed. As no calcifications in the area of the primary tumor were evident at the time of wide excision, and her pathology did not show significant component of DCIS, I think this is likely postsurgical changes. The closest margin for the DCIS present in the specimen was 16 mm, making it unlikely that this is skip area.    Discussed stereotatic biopsy vs 6 month monitoring, patient wishes to wait.  6 months bilateral diagnostic mammogram with right focal spot views and office visit.  PCP: Elsie Stain  Dr. Baruch Gouty Dr. Phineas Douglas Byrnett 03/04/2014, 9:30 AM

## 2014-03-07 ENCOUNTER — Encounter: Payer: Self-pay | Admitting: Gastroenterology

## 2014-04-08 ENCOUNTER — Other Ambulatory Visit: Payer: Self-pay | Admitting: Family Medicine

## 2014-04-08 DIAGNOSIS — I10 Essential (primary) hypertension: Secondary | ICD-10-CM

## 2014-04-10 ENCOUNTER — Other Ambulatory Visit (INDEPENDENT_AMBULATORY_CARE_PROVIDER_SITE_OTHER): Payer: BC Managed Care – PPO

## 2014-04-10 DIAGNOSIS — I10 Essential (primary) hypertension: Secondary | ICD-10-CM

## 2014-04-10 LAB — COMPREHENSIVE METABOLIC PANEL
ALT: 19 U/L (ref 0–35)
AST: 22 U/L (ref 0–37)
Albumin: 3.7 g/dL (ref 3.5–5.2)
Alkaline Phosphatase: 66 U/L (ref 39–117)
BUN: 18 mg/dL (ref 6–23)
CO2: 32 mEq/L (ref 19–32)
Calcium: 9.4 mg/dL (ref 8.4–10.5)
Chloride: 102 mEq/L (ref 96–112)
Creatinine, Ser: 1 mg/dL (ref 0.4–1.2)
GFR: 63.06 mL/min (ref >60.00)
Glucose, Bld: 115 mg/dL — ABNORMAL HIGH (ref 70–99)
Potassium: 4.4 mEq/L (ref 3.5–5.1)
Sodium: 138 mEq/L (ref 135–145)
Total Bilirubin: 0.5 mg/dL (ref 0.2–1.2)
Total Protein: 6.7 g/dL (ref 6.0–8.3)

## 2014-04-10 LAB — LIPID PANEL
Cholesterol: 179 mg/dL (ref 0–200)
HDL: 36.6 mg/dL — ABNORMAL LOW (ref >39.00)
LDL Cholesterol: 107 mg/dL — ABNORMAL HIGH (ref 0–99)
NonHDL: 142.4
Total CHOL/HDL Ratio: 5
Triglycerides: 175 mg/dL — ABNORMAL HIGH (ref 0.0–149.0)
VLDL: 35 mg/dL (ref 0.0–40.0)

## 2014-04-13 ENCOUNTER — Ambulatory Visit (INDEPENDENT_AMBULATORY_CARE_PROVIDER_SITE_OTHER): Payer: BC Managed Care – PPO | Admitting: Family Medicine

## 2014-04-13 ENCOUNTER — Encounter: Payer: Self-pay | Admitting: Family Medicine

## 2014-04-13 ENCOUNTER — Other Ambulatory Visit (HOSPITAL_COMMUNITY)
Admission: RE | Admit: 2014-04-13 | Discharge: 2014-04-13 | Disposition: A | Discharge status: Home / Self Care | Payer: BC Managed Care – PPO | Source: Ambulatory Visit | Attending: Family Medicine | Admitting: Family Medicine

## 2014-04-13 VITALS — BP 124/78 | HR 66 | Temp 98.3°F | Ht 64.5 in | Wt 274.8 lb

## 2014-04-13 DIAGNOSIS — Z01419 Encounter for gynecological examination (general) (routine) without abnormal findings: Secondary | ICD-10-CM

## 2014-04-13 DIAGNOSIS — Z1151 Encounter for screening for human papillomavirus (HPV): Secondary | ICD-10-CM | POA: Insufficient documentation

## 2014-04-13 DIAGNOSIS — I10 Essential (primary) hypertension: Secondary | ICD-10-CM

## 2014-04-13 DIAGNOSIS — R7989 Other specified abnormal findings of blood chemistry: Secondary | ICD-10-CM

## 2014-04-13 DIAGNOSIS — C50311 Malignant neoplasm of lower-inner quadrant of right female breast: Secondary | ICD-10-CM

## 2014-04-13 DIAGNOSIS — E785 Hyperlipidemia, unspecified: Secondary | ICD-10-CM

## 2014-04-13 DIAGNOSIS — Z0000 Encounter for general adult medical examination without abnormal findings: Secondary | ICD-10-CM

## 2014-04-13 NOTE — Patient Instructions (Addendum)
Ask the cancer clinic about the pneumonia shot and the shingles shot.   I would get a flu shot each fall.   Call ortho about the knee injections.  Try to work on your weight- try the bikes at the gym to make it easier on your knee.   Take care.  Glad to see you.  We'll contact you with your lab report.

## 2014-04-13 NOTE — Progress Notes (Signed)
Pre visit review using our clinic review tool, if applicable. No additional management support is needed unless otherwise documented below in the visit note.  CPE- See plan.  Routine anticipatory guidance given to patient.  See health maintenance. Tetanus 2013 PNA due at 63.  Shingles shot d/w pt.  Flu shot encouraged.  Usually done at work.  Colonoscopy f/u pending for 05/01/14 Mammogram comes through cancer and surgery clinic.  Pap due.   DXA not due yet.   Diet and exercise d/w pt.  "Terrible."  Encouraged.  She'll get back to the gym, encouraged.   Living will d/w pt.  Would have daughter Tina Delgado designated if she were incapacitated.    Breast cancer per onc/surgery.    Hypertension:    Using medication without problems or lightheadedness: yes Chest pain with exertion:no Edema:occ prev, none now, was during chemo only Short of breath:no  Elevated Cholesterol: Using medications without problems:yes Muscle aches: not usually, occ cramps better with vinegar Diet compliance:see above Exercise:see above  Sugar up.  D/w pt.  See above.   PMH and SH reviewed  Meds, vitals, and allergies reviewed.   ROS: See HPI.  Otherwise negative.    GEN: nad, alert and oriented HEENT: mucous membranes moist NECK: supple w/o LA CV: rrr. PULM: ctab, no inc wob ABD: soft, +bs EXT: no edema SKIN: no acute rash Normal introitus for age, no external lesions, no vaginal discharge, mucosa pink and moist, no vaginal or cervical lesions, no vaginal atrophy, no friaility or hemorrhage, normal uterus size and position, no adnexal masses or tenderness.  Chaperoned exam.  Pap collected.

## 2014-04-15 NOTE — Assessment & Plan Note (Signed)
She'll work on weight, no changes in meds. Labs d/w pt.  TG elevation noted.

## 2014-04-15 NOTE — Assessment & Plan Note (Signed)
Per onc/surgery.

## 2014-04-15 NOTE — Assessment & Plan Note (Signed)
Controlled, she'll work on weight, no changes in meds. Labs d/w pt.

## 2014-04-15 NOTE — Assessment & Plan Note (Signed)
She'll work on weight, no new meds. Labs d/w pt.

## 2014-04-15 NOTE — Assessment & Plan Note (Signed)
Routine anticipatory guidance given to patient.  See health maintenance. Tetanus 2013 PNA due at 63.  Shingles shot d/w pt.  Flu shot encouraged.  Usually done at work.  Colonoscopy f/u pending for 05/01/14 Mammogram comes through cancer and surgery clinic.  Pap due.   DXA not due yet.   Diet and exercise d/w pt.  "Terrible."  Encouraged.  She'll get back to the gym, encouraged.   Living will d/w pt.  Would have daughter Estill Bamberg designated if she were incapacitated.

## 2014-04-16 LAB — CYTOLOGY - PAP

## 2014-04-18 ENCOUNTER — Ambulatory Visit (AMBULATORY_SURGERY_CENTER): Payer: Self-pay | Admitting: *Deleted

## 2014-04-18 ENCOUNTER — Encounter: Payer: Self-pay | Admitting: *Deleted

## 2014-04-18 VITALS — Ht 64.0 in | Wt 271.0 lb

## 2014-04-18 DIAGNOSIS — Z86010 Personal history of colonic polyps: Secondary | ICD-10-CM

## 2014-04-18 MED ORDER — MOVIPREP 100 G PO SOLR: ORAL | Status: DC

## 2014-04-18 NOTE — Progress Notes (Signed)
Patient denies any allergies to eggs or soy. Patient denies any problems with anesthesia/sedation. Patient denies any oxygen use at home and does not take any diet/weight loss medications. EMMI education assisgned to patient on colonoscopy, this was explained and instructions given to patient. 

## 2014-05-01 ENCOUNTER — Encounter: Payer: Self-pay | Admitting: Gastroenterology

## 2014-05-01 ENCOUNTER — Ambulatory Visit (AMBULATORY_SURGERY_CENTER): Payer: BC Managed Care – PPO | Admitting: Gastroenterology

## 2014-05-01 VITALS — BP 143/80 | HR 60 | Temp 96.7°F | Resp 25 | Ht 64.0 in | Wt 271.0 lb

## 2014-05-01 DIAGNOSIS — Z86010 Personal history of colonic polyps: Secondary | ICD-10-CM

## 2014-05-01 DIAGNOSIS — D126 Benign neoplasm of colon, unspecified: Secondary | ICD-10-CM

## 2014-05-01 MED ORDER — SODIUM CHLORIDE 0.9 % IV SOLN: 500.0000 mL | INTRAVENOUS | Status: DC

## 2014-05-01 NOTE — Op Note (Signed)
[facility]  [address] [place], [id number]   COLONOSCOPY PROCEDURE REPORT  PATIENT: Tina Delgado, Tina Delgado  MR#: [id number] BIRTHDATE: Dec 07, 1955 , 57  yrs. old GENDER: Female ENDOSCOPIST: Ladene Artist, MD, [facility] PROCEDURE DATE:  05/01/2014 PROCEDURE:   Colonoscopy with snare polypectomy First Screening Colonoscopy - Avg.  risk and is 50 yrs.  old or older - No.  Prior Negative Screening - Now for repeat screening. N/A  History of Adenoma - Now for follow-up colonoscopy & has been > or = to 3 yrs.  Yes hx of adenoma.  Has been 3 or more years since last colonoscopy.  Polyps Removed Today? Yes. ASA CLASS:   Class II INDICATIONS:Patient's personal history of adenomatous colon polyps.  MEDICATIONS: MAC sedation, administered by CRNA and propofol (Diprivan) 250mg  IV DESCRIPTION OF PROCEDURE:   After the risks benefits and alternatives of the procedure were thoroughly explained, informed consent was obtained.  A digital rectal exam revealed no abnormalities of the rectum.   The LB [id number]  endoscope was introduced through the anus and advanced to the cecum, which was identified by both the appendix and ileocecal valve. No adverse events experienced.   The quality of the prep was good, using MoviPrep  The instrument was then slowly withdrawn as the colon was fully examined.  COLON FINDINGS: A sessile polyp measuring 6 mm in size was found in the ascending colon.  A polypectomy was performed with a cold snare.  The resection was complete and the polyp tissue was completely retrieved.   A sessile polyp measuring 6 mm in size was found in the descending colon.  A polypectomy was performed with a cold snare.  The resection was complete and the polyp tissue was completely retrieved.   The colon was otherwise normal.  There was no diverticulosis, inflammation, polyps or cancers unless previously stated.  Retroflexed views revealed no abnormalities. The time to  cecum=2 minutes 26 seconds.  Withdrawal time=10 minutes 12 seconds.  The scope was withdrawn and the procedure completed.  COMPLICATIONS: There were no complications.  ENDOSCOPIC IMPRESSION: 1.   Sessile polyp measuring 6 mm in the ascending colon; polypectomy performed with a cold snare 2.   Sessile polyp measuring 6 mm in the descending colon; polypectomy performed with a cold snare  RECOMMENDATIONS: 1.  Await pathology results 2.  Repeat Colonoscopy in 5 years.  eSigned:  Ladene Artist, MD, [facility] 05/01/2014 11:00 AM

## 2014-05-01 NOTE — Patient Instructions (Signed)
YOU HAD AN ENDOSCOPIC PROCEDURE TODAY AT THE [facility]: Refer to the procedure report that was given to you for any specific questions about what was found during the examination.  If the procedure report does not answer your questions, please call your gastroenterologist to clarify.  If you requested that your care partner not be given the details of your procedure findings, then the procedure report has been included in a sealed envelope for you to review at your convenience later.  YOU SHOULD EXPECT: Some feelings of bloating in the abdomen. Passage of more gas than usual.  Walking can help get rid of the air that was put into your GI tract during the procedure and reduce the bloating. If you had a lower endoscopy (such as a colonoscopy or flexible sigmoidoscopy) you may notice spotting of blood in your stool or on the toilet paper. If you underwent a bowel prep for your procedure, then you may not have a normal bowel movement for a few days.  DIET: Your first meal following the procedure should be a light meal and then it is ok to progress to your normal diet.  A half-sandwich or bowl of soup is an example of a good first meal.  Heavy or fried foods are harder to digest and may make you feel nauseous or bloated.  Likewise meals heavy in dairy and vegetables can cause extra gas to form and this can also increase the bloating.  Drink plenty of fluids but you should avoid alcoholic beverages for 24 hours.  ACTIVITY: Your care partner should take you home directly after the procedure.  You should plan to take it easy, moving slowly for the rest of the day.  You can resume normal activity the day after the procedure however you should NOT DRIVE or use heavy machinery for 24 hours (because of the sedation medicines used during the test).    SYMPTOMS TO REPORT IMMEDIATELY: A gastroenterologist can be reached at any hour.  During normal business hours, 8:30 AM to 5:00 PM Monday through Friday,  call [phone].  After hours and on weekends, please call the GI answering service at [phone] who will take a message and have the physician on call contact you.   Following lower endoscopy (colonoscopy or flexible sigmoidoscopy):  Excessive amounts of blood in the stool  Significant tenderness or worsening of abdominal pains  Swelling of the abdomen that is new, acute  Fever of 100F or higher    FOLLOW UP: If any biopsies were taken you will be contacted by phone or by letter within the next 1-3 weeks.  Call your gastroenterologist if you have not heard about the biopsies in 3 weeks.  Our staff will call the home number listed on your records the next business day following your procedure to check on you and address any questions or concerns that you may have at that time regarding the information given to you following your procedure. This is a courtesy call and so if there is no answer at the home number and we have not heard from you through the emergency physician on call, we will assume that you have returned to your regular daily activities without incident.  SIGNATURES/CONFIDENTIALITY: You and/or your care partner have signed paperwork which will be entered into your electronic medical record.  These signatures attest to the fact that that the information above on your After Visit Summary has been reviewed and is understood.  Full responsibility of the confidentiality   this discharge information lies with you and/or your care-partner.   INFORMATION ON POLYPS GIVEN TO YOU TODAY 

## 2014-05-01 NOTE — Progress Notes (Signed)
Called to room to assist during endoscopic procedure.  Patient ID and intended procedure confirmed with present staff. Received instructions for my participation in the procedure from the performing physician.  

## 2014-05-01 NOTE — Progress Notes (Signed)
Patient awake and alert, vss, report to rn 

## 2014-05-02 ENCOUNTER — Telehealth: Payer: Self-pay | Admitting: *Deleted

## 2014-05-02 NOTE — Telephone Encounter (Signed)
  Follow up Call-  Call back number 05/01/2014  Post procedure Call Back phone  # [phone]  Permission to leave phone message Yes     No answer, left message.

## 2014-05-03 ENCOUNTER — Ambulatory Visit: Payer: Self-pay | Admitting: Hematology and Oncology

## 2014-05-07 ENCOUNTER — Encounter: Payer: Self-pay | Admitting: Gastroenterology

## 2014-05-26 ENCOUNTER — Ambulatory Visit: Payer: Self-pay | Admitting: Hematology and Oncology

## 2014-06-05 LAB — CBC CANCER CENTER
Basophil #: 0.1 x10 3/mm (ref 0.0–0.1)
Basophil %: 1.2 %
Eosinophil #: 0.2 x10 3/mm (ref 0.0–0.7)
Eosinophil %: 2.2 %
HCT: 43.8 % (ref 35.0–47.0)
HGB: 14.7 g/dL (ref 12.0–16.0)
Lymphocyte #: 2.1 x10 3/mm (ref 1.0–3.6)
Lymphocyte %: 20.9 %
MCH: 29.9 pg (ref 26.0–34.0)
MCHC: 33.6 g/dL (ref 32.0–36.0)
MCV: 89 fL (ref 80–100)
Monocyte #: 0.7 x10 3/mm (ref 0.2–0.9)
Monocyte %: 6.5 %
Neutrophil #: 7 x10 3/mm — ABNORMAL HIGH (ref 1.4–6.5)
Neutrophil %: 69.2 %
Platelet: 231 x10 3/mm (ref 150–440)
RBC: 4.92 10*6/uL (ref 3.80–5.20)
RDW: 13.7 % (ref 11.5–14.5)
WBC: 10.1 x10 3/mm (ref 3.6–11.0)

## 2014-06-05 LAB — COMPREHENSIVE METABOLIC PANEL
Albumin: 3.6 g/dL (ref 3.4–5.0)
Alkaline Phosphatase: 95 U/L
Anion Gap: 4 — ABNORMAL LOW (ref 7–16)
BUN: 13 mg/dL (ref 7–18)
Bilirubin,Total: 0.5 mg/dL (ref 0.2–1.0)
Calcium, Total: 9 mg/dL (ref 8.5–10.1)
Chloride: 101 mmol/L (ref 98–107)
Co2: 35 mmol/L — ABNORMAL HIGH (ref 21–32)
Creatinine: 1.06 mg/dL (ref 0.60–1.30)
EGFR (African American): >60
EGFR (Non-African Amer.): 57 — ABNORMAL LOW
Glucose: 108 mg/dL — ABNORMAL HIGH (ref 65–99)
Osmolality: 280 (ref 275–301)
Potassium: 4 mmol/L (ref 3.5–5.1)
SGOT(AST): 21 U/L (ref 15–37)
SGPT (ALT): 31 U/L
Sodium: 140 mmol/L (ref 136–145)
Total Protein: 7.4 g/dL (ref 6.4–8.2)

## 2014-06-07 LAB — CANCER ANTIGEN 27.29: CA 27.29: 16.3 U/mL (ref 0.0–38.6)

## 2014-06-26 ENCOUNTER — Ambulatory Visit: Payer: Self-pay | Admitting: Hematology and Oncology

## 2014-07-09 ENCOUNTER — Encounter: Payer: Self-pay | Admitting: Gastroenterology

## 2014-07-16 ENCOUNTER — Ambulatory Visit (INDEPENDENT_AMBULATORY_CARE_PROVIDER_SITE_OTHER): Payer: BC Managed Care – PPO | Admitting: General Surgery

## 2014-07-16 ENCOUNTER — Encounter: Payer: Self-pay | Admitting: General Surgery

## 2014-07-16 VITALS — BP 118/86 | HR 68 | Resp 16 | Ht 65.0 in | Wt 276.0 lb

## 2014-07-16 DIAGNOSIS — C50319 Malignant neoplasm of lower-inner quadrant of unspecified female breast: Secondary | ICD-10-CM

## 2014-07-16 DIAGNOSIS — C50311 Malignant neoplasm of lower-inner quadrant of right female breast: Secondary | ICD-10-CM

## 2014-07-16 NOTE — Patient Instructions (Signed)
Patient to return in 6 months for follow up. Continue self breast exams. Call office for any new breast issues or concerns.

## 2014-07-16 NOTE — Progress Notes (Signed)
Patient ID: Tina Delgado, female   DOB: 13-Feb-1956, 58 y.o.   MRN: [id number]  Chief Complaint  Patient presents with  . Follow-up    bilateral mammogram    HPI Tina Delgado is a 58 y.o. female who presents for a breast evaluation. The most recent mammogram was done on 07/03/14. Patient does perform regular self breast checks and gets regular mammograms done. The patient denies any new problems with the breasts at this time. She also states she had a Brain MRI on 07/04/14 because of odd smells and taste. The results were normal.    HPI  Past Medical History  Diagnosis Date  . Hyperlipidemia   . Hypertension   . Morbid obesity   . Elevated glucose 05/2003    106  . Allergy   . Arthritis     s/p knee injection per ortho  . Mammographic microcalcification   . Malignant neoplasm of upper-outer quadrant of female breast July 20, 2013    histologic grade 3 invasive mammary carcinoma, 13 mm,  2/16 nodes positive, ER-positive, PR positive, HER-2/neu not amplified.A4Z,Y6A    Past Surgical History  Procedure Laterality Date  . Knee arthroscopy  03/04/2007    left Dr Derrel Nip  . Joint replacement  07/30/2007    LTKR Dr Derrel Nip  . Tubal ligation  ~1986    BTL  . Colonoscopy  2007  . Breast surgery Right 07-20-13    Wide excision, SLN biopsy, axillary dissection.  . Portacath placement  2014    Family History  Problem Relation Age of Onset  . Heart disease Mother     MI  . Depression Mother     paranoia  . Hypertension Mother   . Stroke Mother   . Heart disease Brother     MI PTCA at 34  . Heart disease Father   . Colon cancer Neg Hx   . Stomach cancer Maternal Grandfather 33  . Breast cancer Maternal Aunt   . Breast cancer Cousin     Social History History  Substance Use Topics  . Smoking status: Never Smoker   . Smokeless tobacco: Never Used  . Alcohol Use: No     Comment: rare    Allergies  Allergen Reactions  . Lisinopril Cough  . Sulfonamide  Derivatives Hives    REACTION: itching years ago  . Latex Rash    tape    Current Outpatient Prescriptions  Medication Sig Dispense Refill  . ibuprofen (ADVIL,MOTRIN) 200 MG tablet Take 200 mg by mouth every 6 (six) hours as needed.      [name] letrozole (FEMARA) 2.5 MG tablet Take 2.5 mg by mouth daily.      [name] loratadine (CLARITIN) 10 MG tablet Take 10 mg by mouth daily as needed.       . metoprolol (LOPRESSOR) 100 MG tablet Take 1 tablet (100 mg total) by mouth 2 (two) times daily.  180 tablet  3  . omeprazole (PRILOSEC) 20 MG capsule Take 20 mg by mouth daily.      . simvastatin (ZOCOR) 40 MG tablet Take 1 tablet (40 mg total) by mouth at bedtime.  90 tablet  3   No current facility-administered medications for this visit.    Review of Systems Review of Systems  Constitutional: Negative.   Respiratory: Negative.   Cardiovascular: Negative.   All other systems reviewed and are negative.   Blood pressure 118/86, pulse 68, resp. rate 16, height 5' 5"  (1.651 m), weight 276 lb (  125.193 kg).  Physical Exam Physical Exam  Constitutional: She is oriented to person, place, and time. She appears well-developed and well-nourished.  Neck: Neck supple. No thyromegaly present.  Cardiovascular: Normal rate, regular rhythm and normal heart sounds.   No murmur heard. Pulmonary/Chest: Effort normal and breath sounds normal. Right breast exhibits no inverted nipple, no mass, no nipple discharge, no skin change and no tenderness. Left breast exhibits no inverted nipple, no mass, no nipple discharge, no skin change and no tenderness.    Well healed scar at 3 o'clock on the right.   Lymphadenopathy:    She has no cervical adenopathy.    She has no axillary adenopathy.  Neurological: She is alert and oriented to person, place, and time.  Skin: Skin is warm and dry.    Data Reviewed Bilateral mammogram dated July 03, 2014 were reviewed. BI-RAD-2.  Assessment    Doing well status post  management stage II right breast cancer. Good tolerance of Antiestrogen therapy.    Plan    We'll plan for a followup examination in 6 months with repeat diagnostic mammograms in one year.  The patient will be assigned a new medical oncologist at her next [place] followup as Dr. Kallie Edward has left the area.      PCP: Tonia Ghent Ref. MD: [place] 07/17/2014, 6:24 AM

## 2014-07-26 ENCOUNTER — Ambulatory Visit: Payer: Self-pay | Admitting: Hematology and Oncology

## 2014-08-05 ENCOUNTER — Other Ambulatory Visit: Payer: Self-pay | Admitting: Family Medicine

## 2014-08-26 ENCOUNTER — Ambulatory Visit: Payer: Self-pay | Admitting: Hematology and Oncology

## 2014-08-27 ENCOUNTER — Encounter: Payer: Self-pay | Admitting: General Surgery

## 2014-09-25 ENCOUNTER — Ambulatory Visit: Payer: Self-pay | Admitting: Hematology and Oncology

## 2014-10-10 LAB — CBC CANCER CENTER
Basophil #: 0.1 x10 3/mm (ref 0.0–0.1)
Basophil %: 0.8 %
Eosinophil #: 0.1 x10 3/mm (ref 0.0–0.7)
Eosinophil %: 1.2 %
HCT: 43.5 % (ref 35.0–47.0)
HGB: 14.4 g/dL (ref 12.0–16.0)
Lymphocyte #: 2.6 x10 3/mm (ref 1.0–3.6)
Lymphocyte %: 21.6 %
MCH: 29.5 pg (ref 26.0–34.0)
MCHC: 33.2 g/dL (ref 32.0–36.0)
MCV: 89 fL (ref 80–100)
Monocyte #: 0.7 x10 3/mm (ref 0.2–0.9)
Monocyte %: 5.7 %
Neutrophil #: 8.3 x10 3/mm — ABNORMAL HIGH (ref 1.4–6.5)
Neutrophil %: 70.7 %
Platelet: 233 x10 3/mm (ref 150–440)
RBC: 4.89 10*6/uL (ref 3.80–5.20)
RDW: 13.5 % (ref 11.5–14.5)
WBC: 11.8 x10 3/mm — ABNORMAL HIGH (ref 3.6–11.0)

## 2014-10-10 LAB — COMPREHENSIVE METABOLIC PANEL
Albumin: 3.6 g/dL (ref 3.4–5.0)
Alkaline Phosphatase: 98 U/L
Anion Gap: 6 — ABNORMAL LOW (ref 7–16)
BUN: 19 mg/dL — ABNORMAL HIGH (ref 7–18)
Bilirubin,Total: 0.4 mg/dL (ref 0.2–1.0)
Calcium, Total: 8.7 mg/dL (ref 8.5–10.1)
Chloride: 101 mmol/L (ref 98–107)
Co2: 32 mmol/L (ref 21–32)
Creatinine: 1.05 mg/dL (ref 0.60–1.30)
EGFR (African American): >60
EGFR (Non-African Amer.): 57 — ABNORMAL LOW
Glucose: 134 mg/dL — ABNORMAL HIGH (ref 65–99)
Osmolality: 282 (ref 275–301)
Potassium: 3.8 mmol/L (ref 3.5–5.1)
SGOT(AST): 14 U/L — ABNORMAL LOW (ref 15–37)
SGPT (ALT): 26 U/L
Sodium: 139 mmol/L (ref 136–145)
Total Protein: 7.2 g/dL (ref 6.4–8.2)

## 2014-10-26 ENCOUNTER — Ambulatory Visit: Payer: Self-pay | Admitting: Hematology and Oncology

## 2014-11-26 ENCOUNTER — Ambulatory Visit: Payer: Self-pay | Admitting: Hematology and Oncology

## 2015-01-04 ENCOUNTER — Ambulatory Visit
Admit: 2015-01-04 | Disposition: A | Discharge status: Home / Self Care | Payer: Self-pay | Attending: Hematology and Oncology | Admitting: Hematology and Oncology

## 2015-01-15 ENCOUNTER — Ambulatory Visit (INDEPENDENT_AMBULATORY_CARE_PROVIDER_SITE_OTHER): Payer: BLUE CROSS/BLUE SHIELD | Admitting: General Surgery

## 2015-01-15 ENCOUNTER — Encounter: Payer: Self-pay | Admitting: General Surgery

## 2015-01-15 VITALS — BP 120/80 | HR 76 | Resp 14 | Ht 65.0 in | Wt 280.0 lb

## 2015-01-15 DIAGNOSIS — C50311 Malignant neoplasm of lower-inner quadrant of right female breast: Secondary | ICD-10-CM | POA: Diagnosis not present

## 2015-01-15 NOTE — Progress Notes (Signed)
Patient ID: Tina Delgado, female   DOB: 1956/04/28, 59 y.o.   MRN: [id number]  Chief Complaint  Patient presents with  . Follow-up    HPI Tina Delgado is a 59 y.o. female.  Here today for breast cancer follow up. She states she has been on Anastrozole since about September and tolerating it well. No mammogram since 07-03-14. Her next mammogram is scheduled for September. No new breast issues. She did state that the cancer center was unable to obtain blood from her port and the patient would like to have it removed. Her last cheomtherapy treatment was January 2015   HPI  Past Medical History  Diagnosis Date  . Hyperlipidemia   . Hypertension   . Morbid obesity   . Elevated glucose 05/2003    106  . Allergy   . Arthritis     s/p knee injection per ortho  . Mammographic microcalcification   . Malignant neoplasm of upper-outer quadrant of female breast July 20, 2013    histologic grade 3 invasive mammary carcinoma, 13 mm,  2/16 nodes positive, ER-positive, PR positive, HER-2/neu not amplified.E0P,Q3R    Past Surgical History  Procedure Laterality Date  . Knee arthroscopy  03/04/2007    left Dr Derrel Nip  . Joint replacement  07/30/2007    LTKR Dr Derrel Nip  . Tubal ligation  ~1986    BTL  . Colonoscopy  2007  . Breast surgery Right 07-20-13    Wide excision, SLN biopsy, axillary dissection.  . Portacath placement  2014    Family History  Problem Relation Age of Onset  . Heart disease Mother     MI  . Depression Mother     paranoia  . Hypertension Mother   . Stroke Mother   . Heart disease Brother     MI PTCA at 40  . Heart disease Father   . Colon cancer Neg Hx   . Stomach cancer Maternal Grandfather 23  . Breast cancer Maternal Aunt   . Breast cancer Cousin     Social History History  Substance Use Topics  . Smoking status: Never Smoker   . Smokeless tobacco: Never Used  . Alcohol Use: No     Comment: rare    Allergies  Allergen Reactions  .  Lisinopril Cough  . Sulfonamide Derivatives Hives    REACTION: itching years ago  . Latex Rash    tape    Current Outpatient Prescriptions  Medication Sig Dispense Refill  . anastrozole (ARIMIDEX) 1 MG tablet Take 1 mg by mouth daily.     . Calcium Carbonate-Vitamin D (CALCIUM + D PO) Take 600 mg by mouth 2 (two) times daily.    . ergocalciferol (VITAMIN D2) 50000 UNITS capsule Take 50,000 Units by mouth every 30 (thirty) days.    [name] ibuprofen (ADVIL,MOTRIN) 200 MG tablet Take 200 mg by mouth every 6 (six) hours as needed.    . metoprolol (LOPRESSOR) 100 MG tablet TAKE ONE TABLET BY MOUTH TWICE DAILY 180 tablet 1  . omeprazole (PRILOSEC) 20 MG capsule Take 20 mg by mouth daily.    . simvastatin (ZOCOR) 40 MG tablet TAKE ONE TABLET BY MOUTH AT BEDTIME 90 tablet 1   No current facility-administered medications for this visit.    Review of Systems Review of Systems  Constitutional: Negative.   Respiratory: Negative.   Cardiovascular: Negative.     Blood pressure 120/80, pulse 76, resp. rate 14, height 5' 5"  (1.651 m), weight 280 lb (127.007  kg).  Physical Exam Physical Exam  Constitutional: She is oriented to person, place, and time. She appears well-developed and well-nourished.  Neck: Neck supple.  Cardiovascular: Normal rate, regular rhythm and normal heart sounds.   Pulmonary/Chest: Effort normal and breath sounds normal. Right breast exhibits no inverted nipple, no mass, no nipple discharge, no skin change and no tenderness. Left breast exhibits no inverted nipple, no mass, no nipple discharge, no skin change and no tenderness.  Right breast with well healed incision 3 o'clock and axilla, with minimal skin thickening.  Lymphadenopathy:    She has no cervical adenopathy.    She has no axillary adenopathy.  Neurological: She is alert and oriented to person, place, and time.  Skin: Skin is warm and dry.    Data Reviewed Medical oncology notes of 10/10/2014.  Assessment     Doing well status post breast conservation.    Plan    The patient reports that her September 2016 mammograms have been scheduled by medical oncology. We'll arrange for a follow-up appointment in late September to review her clinical exam and those films.  I spoke with Joette Catching, M.D. for medical oncology. She reports that there are no plans of present for additional chemotherapy, although she will review the records to be sure. She'll call back if the port should not be removed.    Follow up in 6 months   PCP:  Tonia Ghent Dr. Sunday Shams, Forest Gleason 01/16/2015, 6:51 AM

## 2015-01-15 NOTE — Patient Instructions (Signed)
The patient is aware to call back for any questions or concerns. Continue self breast exams. Call office for any new breast issues or concerns. 

## 2015-01-16 ENCOUNTER — Telehealth: Payer: Self-pay

## 2015-01-16 NOTE — Telephone Encounter (Signed)
-----   Message from Robert Bellow, MD sent at 01/16/2015  6:54 AM EDT ----- Please notify the patient that I spoke with Dr. Mike Gip, and her power port can be removed at any time. This can be set up as an office procedure at her convenience.

## 2015-01-25 ENCOUNTER — Ambulatory Visit
Admit: 2015-01-25 | Disposition: A | Discharge status: Home / Self Care | Payer: Self-pay | Attending: Hematology and Oncology | Admitting: Hematology and Oncology

## 2015-01-25 ENCOUNTER — Other Ambulatory Visit: Payer: Self-pay | Admitting: Family Medicine

## 2015-02-04 ENCOUNTER — Ambulatory Visit (INDEPENDENT_AMBULATORY_CARE_PROVIDER_SITE_OTHER): Payer: BLUE CROSS/BLUE SHIELD | Admitting: General Surgery

## 2015-02-04 ENCOUNTER — Encounter: Payer: Self-pay | Admitting: General Surgery

## 2015-02-04 VITALS — BP 134/74 | HR 74 | Resp 14 | Ht 65.0 in | Wt 283.0 lb

## 2015-02-04 DIAGNOSIS — C50311 Malignant neoplasm of lower-inner quadrant of right female breast: Secondary | ICD-10-CM

## 2015-02-04 NOTE — Patient Instructions (Signed)
Patient to return in 1 week nurse

## 2015-02-04 NOTE — Progress Notes (Signed)
Patient ID: Tina Delgado, female   DOB: 05/15/1956, 59 y.o.   MRN: [id number]  Chief Complaint  Patient presents with  . Procedure    port removal     HPI Tina Delgado is a 59 y.o. female here today for a port removal. The patient completed 4 cycles of chemotherapy with significant toxicity with the last treatment. HPI  Past Medical History  Diagnosis Date  . Hyperlipidemia   . Hypertension   . Morbid obesity   . Elevated glucose 05/2003    106  . Allergy   . Arthritis     s/p knee injection per ortho  . Mammographic microcalcification   . Malignant neoplasm of upper-outer quadrant of female breast July 20, 2013    histologic grade 3 invasive mammary carcinoma, 13 mm,  2/16 nodes positive, ER-positive, PR positive, HER-2/neu not amplified.K1S,W1U    Past Surgical History  Procedure Laterality Date  . Knee arthroscopy  03/04/2007    left Dr Derrel Nip  . Joint replacement  07/30/2007    LTKR Dr Derrel Nip  . Tubal ligation  ~1986    BTL  . Colonoscopy  2007  . Breast surgery Right 07-20-13    Wide excision, SLN biopsy, axillary dissection.  . Portacath placement  2014    Family History  Problem Relation Age of Onset  . Heart disease Mother     MI  . Depression Mother     paranoia  . Hypertension Mother   . Stroke Mother   . Heart disease Brother     MI PTCA at 31  . Heart disease Father   . Colon cancer Neg Hx   . Stomach cancer Maternal Grandfather 17  . Breast cancer Maternal Aunt   . Breast cancer Cousin     Social History History  Substance Use Topics  . Smoking status: Never Smoker   . Smokeless tobacco: Never Used  . Alcohol Use: No     Comment: rare    Allergies  Allergen Reactions  . Lisinopril Cough  . Sulfonamide Derivatives Hives    REACTION: itching years ago  . Latex Rash    tape    Current Outpatient Prescriptions  Medication Sig Dispense Refill  . anastrozole (ARIMIDEX) 1 MG tablet Take 1 mg by mouth daily.     .  Calcium Carbonate-Vitamin D (CALCIUM + D PO) Take 600 mg by mouth 2 (two) times daily.    . ergocalciferol (VITAMIN D2) 50000 UNITS capsule Take 50,000 Units by mouth every 30 (thirty) days.    [name] ibuprofen (ADVIL,MOTRIN) 200 MG tablet Take 200 mg by mouth every 6 (six) hours as needed.    . metoprolol (LOPRESSOR) 100 MG tablet TAKE ONE TABLET BY MOUTH TWICE DAILY 180 tablet 1  . omeprazole (PRILOSEC) 20 MG capsule Take 20 mg by mouth daily.    . simvastatin (ZOCOR) 40 MG tablet TAKE ONE TABLET BY MOUTH AT BEDTIME 90 tablet 0   No current facility-administered medications for this visit.    Review of Systems Review of Systems  Constitutional: Negative.   Respiratory: Negative.   Cardiovascular: Negative.     Blood pressure 134/74, pulse 74, resp. rate 14, height _0  (1.651 m), weight 283 lb (128.368 kg).  Physical Exam Physical Exam The PowerPort site on the left upper inner quadrant is well healed without evidence of infection. Insertion site for cannulation of the vein along the brachiocephalic vessel over the left shoulder is clean.  Data Reviewed Order for  port removal has been received.  Assessment    Candidate for port removal.    Plan    The procedure was reviewed with the patient and she was amenable to proceed. The area was prepped with alcohol followed by 10 mL of 0.5% Xylocaine with 0.25% Marcaine with 1-200,000 of epinephrine. After suitable interval the area was reprepped with ChloraPrep and draped. The previous incision was opened and the port exposed. The previous transfixion sutures were removed and the port and catheter was removed intact. No bleeding. The wound was closed with 3-0 Vicryl to the adipose layer and a running 4-0 Vicryl septic suture for the skin. Benzoin, Steri-Strips Telfa and Tegaderm dressing applied.  The patient was advised to make use of localized for comfort over the next 24-48 hours. She'll return in one week for wound evaluation with the  staff. Plans at present are for follow-up in fall 2017 with repeat mammography at that time.     PCP:  Azucena Freed 02/04/2015, 8:03 PM

## 2015-02-08 ENCOUNTER — Other Ambulatory Visit: Payer: Self-pay | Admitting: Hematology and Oncology

## 2015-02-08 DIAGNOSIS — Z853 Personal history of malignant neoplasm of breast: Secondary | ICD-10-CM

## 2015-02-11 ENCOUNTER — Ambulatory Visit (INDEPENDENT_AMBULATORY_CARE_PROVIDER_SITE_OTHER): Payer: BLUE CROSS/BLUE SHIELD | Admitting: *Deleted

## 2015-02-11 DIAGNOSIS — C50311 Malignant neoplasm of lower-inner quadrant of right female breast: Secondary | ICD-10-CM

## 2015-02-11 NOTE — Progress Notes (Signed)
Patient came in today for a wound check at port site..  The wound is clean, with no signs of infection noted. Follow up as scheduled.

## 2015-02-15 NOTE — Op Note (Signed)
PATIENT NAME:  Tina Delgado, Tina Delgado MR#:  [id number] DATE OF BIRTH:  05-26-1956  DATE OF PROCEDURE:  08/15/2013  PREOPERATIVE DIAGNOSIS: Advanced right breast cancer, need for central venous access.   POSTOPERATIVE DIAGNOSIS: Advanced right breast cancer, need for central venous access.   OPERATIVE PROCEDURE: Left PowerPort placement via the brachiocephalic/subclavian vein.   SURGEON: Hervey Ard, MD.   ANESTHESIA: Attended local, 30 mL of 1% plain Xylocaine.   CLINICAL NOTE: This 59 year old woman has a stage II cancer of the right breast and is felt to be a candidate for adjuvant chemotherapy. Central venous access was requested by the treating oncologist.   The patient underwent sedation and was placed into a Trendelenburg position. Ultrasound was used to evaluate the area of the subclavian vein. This could not be identified due to the patient's generous layer of adipose tissue. The brachiocephalic vein was noted at the brachial groove and it was elected to complete a cutdown at this area and to cannulate the subclavian vein in this fashion. The local anesthesia was established and a 6 cm incision made in the area anterior to the left shoulder. This was carried down to the skin and subcutaneous tissue. Hemostasis was achieved with electrocautery. The brachiocephalic vein was mobilized and controlled with 3-0 silk loops. The vein was cannulated and the guidewire advanced under fluoroscopic guidance into the SVC. The dilator was placed followed by the cannula and catheter. The catheter was positioned at the junction of the SVC and right atrium. The port site placement was chosen on the medial chest in the area of the thinnest layer of adipose tissue overlying the costal cartilage. An incision was made in this area and the port implanted. The catheter was tunneled to this location. It easily irrigated and aspirated with the patient in the supine position. The port was anchored with 3-point fixation  using Prolene sutures. The wounds were then closed in layers with 3-0 Vicryl to the adipose layer and a running 4-0 Vicryl subcuticular suture for the skin. Benzoin and Steri-Strips followed by Telfa and Tegaderm dressings were applied.   Erect portable chest x-ray in the recovery room showed no evidence of pneumothorax and the catheter tip as described above.     ____________________________ Robert Bellow, MD jwb:dp D: 08/16/2013 09:31:04 ET T: 08/16/2013 09:38:08 ET JOB#: [id number]  cc: Robert Bellow, MD, <Dictator> Elveria Rising. Damita Dunnings, MD Rae Halsted Kallie Edward, MD  Alleya Demeter Amedeo Kinsman MD ELECTRONICALLY SIGNED 08/16/2013 12:07

## 2015-02-15 NOTE — Op Note (Signed)
PATIENT NAME:  Tina Delgado, HEINER MR#:  [id number] DATE OF BIRTH:  [birth year]  DATE OF PROCEDURE:  07/20/2013  PREOPERATIVE DIAGNOSIS: Invasive mammary carcinoma of the right breast.   POSTOPERATIVE DIAGNOSIS: Invasive mammary carcinoma of the right breast.     OPERATIVE PROCEDURE: Wide local excision, mastoplasty, sentinel node biopsy, axillary dissection.   OPERATING SURGEON: Hervey Ard, MD  ANESTHESIA: General endotracheal under Dr. Kayleen Memos, Marcaine 0.5% with 1:200,000 units epinephrine 30 mL local infiltration.   ESTIMATED BLOOD LOSS: Minimal.   CLINICAL NOTE: This 59 year old woman had undergone biopsy of the inferior medial aspect of the right breast with identification of invasive mammary carcinoma. Preoperative axillary ultrasound had suggested a normal diameter node with loss of normal architecture. She desired breast conservation. She received technetium with sulfur colloid in the subareolar plexus prior to the procedure. After the induction of anesthesia, 6 mL of methylene blue 1:2 with saline was instilled in the subareolar plexus to assist with node identification.   Attention was turned to the axilla. The gamma finder was used to locate the area of increased uptake. A transverse incision was made and then an exceptionally generous layer of adipose tissue approximately 6 cm in depth was encountered. Two hot blue nodes were identified. The first showed three 1 mm foci of metastatic disease, the second showed gross metastatic disease. For that reason, a completion axillary dissection was done after the wide excision.   The area of the previous biopsy cavity was identified by ultrasound. This was within 1 cm of the skin. At this time, the patient was still felt to be a candidate for partial breast radiation and a better skin buffer would be required. An ellipse of skin from the base of the nipple extending to the edge of the breast parenchyma at the 4 to 5 o'clock position was  outlined. The skin was incised sharply after Marcaine was infiltrated for postoperative analgesia. The remaining dissection was completed with electrocautery. The area was excised down to but not including the pectoralis fascia. The specimen was orientated and postexcision specimen radiograph confirmed the previously-placed clip to be within the center of the specimen. Margin report from Dr. Luana Shu in pathology was negative. The breast tissue was mobilized off the underlying pectoralis fascia for approximately 7 cm in all directions. This was then closed in multiple layers to reconstruct the breast volume in the medial aspect of the breast. The skin was closed with a running 2-0 Vicryl deep dermal suture.   Attention was then turned back to the axilla. A formal axillary dissection was completed making use of the Harmonic scalpel for hemostasis. The borders of the dissection were the axillary vein superiorly, the chest wall medially and the latissimus muscle posteriorly. The long thoracic nerve of Bell and thoracodorsal nerve, artery and vein bundles were identified and protected. Both nerves were functional at the end of the procedure. This was a level 1 to 2 dissection. The area was irrigated and a 15-gauge Blake drain brought out through a separate stab wound incision and anchored in place with 3-0 nylon. The wound was closed in layers with 2-0 Vicryl to the deep fascia and the superficial fat, and a running 4-0 Vicryl subcuticular suture for the skin. Benzoin and Steri-Strips were applied. The fluff gauze was placed and the patient's own brassiere placed for support.   The patient tolerated the procedure well and was taken to the recovery room in stable condition.   ____________________________ Robert Bellow, MD jwb:cs D: 07/21/2013 18:31:56  ET T: 07/21/2013 19:02:38 ET JOB#: [id number]  cc: Robert Bellow, MD, <Dictator> Disaya Walt Amedeo Kinsman MD ELECTRONICALLY SIGNED 07/24/2013 15:28

## 2015-02-16 NOTE — Consult Note (Signed)
Reason for Visit: This 59 year old Female patient presents to the clinic for initial evaluation of  breast cancer .   Referred by Dr. Kallie Edward.  Diagnosis:  Chief Complaint/Diagnosis   59 year old female with stage IIa (T1 C. N1 a M0) invasive mammary carcinoma status post wide local excision and axillary node dissection and adjuvant chemotherapy ER/PR tumor HER-2/neu not overexpressed  Pathology Report pathology report reviewed   Imaging Report mammograms ultrasound reviewed   Referral Report clinical notes reviewed   Planned Treatment Regimen adjuvant whole breast radiation   HPI   patient is a 59 year old female who been followed from her some subtle abnormalities in her right breast initially presenting as a few scattered microcalcifications in the medial aspect. She does follow with serial mammograms and in August of 2014 there appeared a 1.2 cm hypoechoic mass 5 cm from the nipple. Ultrasound-guided biopsy was performed in August confirming invasive mammary carcinoma. Underwent a wide local excision for a 1.3 cm grade 3 invasive mammary carcinoma 2 sentinel lymph nodes were +14 additional axilla lymph nodes were negative for metastatic disease. Margins were clear 1.6 cm tumor again was ER/PR positive HER-2/neu not overexpressed. She went on to have Taxotere and Cytoxan on NSABP protocol 37.she had hypersensitivity to Taxotere was discontinued from study. She's also develop some slight lymphedema in the right upper extremities or to have a rapid placed and is being fitted for a sleeve. She otherwise is without complaint specifically denies breast tenderness cough or bone pain.  Past Hx:    Oncology Protocol: Patient is on research protocol NSABP B-47 receiving Taxotere and Cytoxan every three weeks. Yolande Jolly is research nurse contact, ext [phone]. For questions or problems contact the cancer center at [phone].   Hyperlipidemia:    GERD - Esophageal Reflux:    Arthritis:     Obesity:    Breast Cancer: right   Depression:    HTN:    Wide Excision Right Breast with SN biopsy: Sep 2014   left total knee replacement: 03-Aug-2007   left knee arthroscopy: 2008   bilateral tubal ligation: 1982  Past, Family and Social History:  Past Medical History positive   Cardiovascular hyperlipidemia; hypertension   Gastrointestinal GERD   Neurological/Psychiatric depression   Past Surgical History bilateral tubal ligation, left knee arthroscopic, left total knee replacement   Past Medical History Comments morbid obesity, arthritis   Family History positive   Family History Comments family history positive for hypertension cardiac heart disease   Social History noncontributory   Additional Past Medical and Surgical History seen by herself today   Allergies:   Lisinopril: Cough  Sulfa drugs: Itching  Tape: Blisters  Home Meds:  Home Medications: Medication Instructions Status  magnesium oxide 400 mg (241.3 mg elemental magnesium) oral tablet 1 tab(s) orally once a day Active  Compazine 10 mg oral tablet 1 tab(s) orally 3 times a day, As Needed - for Nausea, Vomiting Active  ibuprofen 200 mg oral tablet 2 tab(s) orally 2 times a day, As Needed - for Pain Active  metoprolol 100 mg oral tablet 1 tab(s) orally 2 times a day Active  PriLOSEC 20 mg oral delayed release tablet 1 tab(s) orally once a day, As Needed Active  simvastatin 40 mg oral tablet 1 tab(s) orally once a day (at bedtime) Active   Review of Systems:  General negative   Performance Status (ECOG) 0   Skin negative   Breast see HPI   Ophthalmologic negative   ENMT  negative   Respiratory and Thorax negative   Cardiovascular negative   Gastrointestinal negative   Musculoskeletal negative   Neurological negative   Psychiatric negative   Hematology/Lymphatics negative   Endocrine negative   Allergic/Immunologic negative   Review of Systems   review of systems  obtained from nurse's notes  Nursing Notes:  Nursing Vital Signs and Chemo Nursing Nursing Notes: *CC Vital Signs Flowsheet:   04-Feb-15 14:02  Temp Temperature 98.6  Pulse Pulse 81  Respirations Respirations 20  SBP SBP 118  DBP DBP 81  Pain Scale (0-10)  0  Current Weight (kg) (kg) 127.9  Height (cm) centimeters 165.4  BSA (m2) 2.2   Physical Exam:  General/Skin/HEENT:  General normal   Skin normal   Eyes normal   ENMT normal   Head and Neck normal   Additional PE well-developed obese female in NAD. She status post wide local excision the medial aspect at 3:00 of the right breast. No dominant mass or nodularity is noted in either breast into position examined. She is large breasted. No axillary or supraclavicular adenopathy is identified. Left breast is clear. Lungs are clear to A&P cardiac examination shows regular rate and rhythm.   Breasts/Resp/CV/GI/GU:  Respiratory and Thorax normal   Cardiovascular normal   Gastrointestinal normal   Genitourinary normal   MS/Neuro/Psych/Lymph:  Musculoskeletal normal   Neurological normal   Lymphatics normal   Other Results:  Radiology Results: LabUnknown:    19-Aug-14 10:38, Digital Diagnostic Mammogram Bilateral  PACS [place]:  Digital Diagnostic Mammogram Bilateral   REASON FOR EXAM:    RT BR CALCS FU AND YRLY  COMMENTS:       PROCEDURE: MAM - MAM DGTL DIAGNOSTIC MAMMO W/CAD  - Jun 13 2013 10:38AM     *RADIOLOGY REPORT*    Clinical Data:  59 year old female for annual bilateral mammograms  and follow-up right breast calcifications.    DIGITAL DIAGNOSTIC BILATERAL MAMMOGRAM WITH CAD AND RIGHT   BREAST ULTRASOUND:    Comparison:  12/12/2012 and prior mammograms dating back to  08/06/2003  Findings:    ACR Breast Density Category b:  There are scattered areas of  fibroglandular density.    There has been interval development of an irregular mass with  associated heterogeneous  calcifications within the inner right  breast, anterior third.  A 2 mm group of heterogeneous calcifications with associated  density is identified 1.5 cm anterolateral to the above irregular  mass.    There is no evidence of suspicious mass, distortion or worrisome  calcifications within the left breast.  Mammographic images were processed with CAD.  On physical exam, minimal thickening is identified at the 3 o'clock  position of the right breast 5 cm from the nipple.    Ultrasound is performed, showing a 1.3 x 0.8 x 1.2 cm irregular  hypoechoic mass with calcifications at the 3 o'clock position of  the right breast 5 cm from the nipple - highly suspicious for  neoplasm.  The smaller group of calcifications identified mammographically is  not visualized sonographically.  No enlarged or abnormal appearing right axillary lymph nodes are  identified.     IMPRESSION:  Highly suspicious 1.3 x 0.8 x 1.2 cm hypoechoic mass within the  inner right breast.  Tissue sampling is recommended.  Suspicious 2 mm group of heterogeneous calcifications located 1.5  cm anterolateral to the above mass. Consider stereotactic biopsy if  tissue confirmation would change surgical treatment.  No enlarged or abnormal-appearing right axillary lymph nodes.    No mammographic evidence of left breast malignancy.    BI-RADS CATEGORY 5:  Highly suggestive of malignancy - appropriate  action should be taken.    RECOMMENDATION:  Ultrasound guided biopsy of the inner right breast mass.    Consider stereotactic guided biopsy of smaller group of right  breast calcifications if surgical treatment would be changed.    I have discussed the findings and recommendations with the patient.  Results were also provided in writing at the conclusion of the  visit.      Original Report Authenticated By: Margarette Canada, M.D.         Verified By: Lura Em, M.D.,   Relevent Results:   Relevant Scans and Labs  mammograms reviewed   Assessment and Plan: Impression:   stage IIa invasive mammary carcinoma the right breast status post wide local excision and axillary node dissection and adjuvant chemotherapy in 59 year old female Plan:   at this time I have recommended whole breast radiation. I lwe will forego peripheral lymphatic radiation her lymph nodes based on thefact that only 2 of 14 nodes were positive and that she is very developed slight lymphedema in her right upper extremity. Have emphasized to the patient I like her to exercise the arm with weights as much as possible.I have recommended5000 cGy to her right breast post a 1400 cGy electron scar boost. Risks and benefits of radiation were reviewed with the patient. Side effects including skin reaction which should be more accentuated based on her large breast, fatigue, inclusive some superficial lung, and possible alteration of blood counts all were explained in detail to the patient. I have set her up for a T2 CT simulation next week. Patient also be candidate for aromatase inhibitor after completion of radiation.  I would like to take this opportunity to thank you for allowing me to continue to participate in this patient's care.  CC Referral:  cc: Dr. Hervey Ard, Dr. Elsie Stain   Electronic Signatures: Baruch Gouty, Roda Shutters (MD)  (Signed 12-Feb-15 15:05)  Authored: HPI, Diagnosis, Past Hx, PFSH, Allergies, Home Meds, ROS, Nursing Notes, Physical Exam, Other Results, Relevent Results, Encounter Assessment and Plan, CC Referring Physician   Last Updated: 12-Feb-15 15:05 by Armstead Peaks (MD)

## 2015-04-10 ENCOUNTER — Other Ambulatory Visit: Payer: Self-pay | Admitting: Family Medicine

## 2015-04-10 NOTE — Telephone Encounter (Signed)
Sent. Thanks.   

## 2015-04-10 NOTE — Telephone Encounter (Signed)
Received refill request electronically from pharmacy. Last office visit 04/13/14. Appointment scheduled 06/21/15 Is it okay to refill until appointment?

## 2015-05-06 ENCOUNTER — Other Ambulatory Visit: Payer: Self-pay

## 2015-05-06 DIAGNOSIS — C50311 Malignant neoplasm of lower-inner quadrant of right female breast: Secondary | ICD-10-CM

## 2015-05-09 ENCOUNTER — Inpatient Hospital Stay: Payer: BLUE CROSS/BLUE SHIELD | Attending: Hematology and Oncology | Admitting: Hematology and Oncology

## 2015-05-09 ENCOUNTER — Other Ambulatory Visit: Payer: Self-pay

## 2015-05-09 ENCOUNTER — Inpatient Hospital Stay: Payer: BLUE CROSS/BLUE SHIELD

## 2015-05-09 ENCOUNTER — Encounter: Payer: Self-pay | Admitting: Hematology and Oncology

## 2015-05-09 VITALS — BP 134/83 | HR 74 | Temp 98.9°F | Ht 65.0 in | Wt 285.5 lb

## 2015-05-09 DIAGNOSIS — E785 Hyperlipidemia, unspecified: Secondary | ICD-10-CM | POA: Diagnosis not present

## 2015-05-09 DIAGNOSIS — M19012 Primary osteoarthritis, left shoulder: Secondary | ICD-10-CM | POA: Insufficient documentation

## 2015-05-09 DIAGNOSIS — M19011 Primary osteoarthritis, right shoulder: Secondary | ICD-10-CM | POA: Diagnosis not present

## 2015-05-09 DIAGNOSIS — Z923 Personal history of irradiation: Secondary | ICD-10-CM | POA: Insufficient documentation

## 2015-05-09 DIAGNOSIS — Z79811 Long term (current) use of aromatase inhibitors: Secondary | ICD-10-CM

## 2015-05-09 DIAGNOSIS — Z170 Estrogen receptor positive status [ER+]: Secondary | ICD-10-CM | POA: Diagnosis not present

## 2015-05-09 DIAGNOSIS — C779 Secondary and unspecified malignant neoplasm of lymph node, unspecified: Secondary | ICD-10-CM | POA: Insufficient documentation

## 2015-05-09 DIAGNOSIS — Z79899 Other long term (current) drug therapy: Secondary | ICD-10-CM

## 2015-05-09 DIAGNOSIS — I10 Essential (primary) hypertension: Secondary | ICD-10-CM | POA: Diagnosis not present

## 2015-05-09 DIAGNOSIS — M170 Bilateral primary osteoarthritis of knee: Secondary | ICD-10-CM | POA: Diagnosis not present

## 2015-05-09 DIAGNOSIS — C50411 Malignant neoplasm of upper-outer quadrant of right female breast: Secondary | ICD-10-CM | POA: Insufficient documentation

## 2015-05-09 DIAGNOSIS — C50311 Malignant neoplasm of lower-inner quadrant of right female breast: Secondary | ICD-10-CM

## 2015-05-09 LAB — COMPREHENSIVE METABOLIC PANEL
ALT: 24 U/L (ref 14–54)
AST: 24 U/L (ref 15–41)
Albumin: 3.9 g/dL (ref 3.5–5.0)
Alkaline Phosphatase: 80 U/L (ref 38–126)
Anion gap: 6 (ref 5–15)
BUN: 16 mg/dL (ref 6–20)
CO2: 32 mmol/L (ref 22–32)
Calcium: 8.7 mg/dL — ABNORMAL LOW (ref 8.9–10.3)
Chloride: 102 mmol/L (ref 101–111)
Creatinine, Ser: 1.05 mg/dL — ABNORMAL HIGH (ref 0.44–1.00)
GFR calc Af Amer: >60 mL/min (ref >60)
GFR calc non Af Amer: 57 mL/min — ABNORMAL LOW (ref >60)
Glucose, Bld: 151 mg/dL — ABNORMAL HIGH (ref 65–99)
Potassium: 4.5 mmol/L (ref 3.5–5.1)
Sodium: 140 mmol/L (ref 135–145)
Total Bilirubin: 0.6 mg/dL (ref 0.3–1.2)
Total Protein: 7.1 g/dL (ref 6.5–8.1)

## 2015-05-09 LAB — CBC WITH DIFFERENTIAL/PLATELET
Basophils Absolute: 0.1 10*3/uL (ref 0–0.1)
Basophils Relative: 1 %
Eosinophils Absolute: 0.2 10*3/uL (ref 0–0.7)
Eosinophils Relative: 2 %
HCT: 43.9 % (ref 35.0–47.0)
Hemoglobin: 14.6 g/dL (ref 12.0–16.0)
Lymphocytes Relative: 22 %
Lymphs Abs: 2.1 10*3/uL (ref 1.0–3.6)
MCH: 29.6 pg (ref 26.0–34.0)
MCHC: 33.2 g/dL (ref 32.0–36.0)
MCV: 89.3 fL (ref 80.0–100.0)
Monocytes Absolute: 0.6 10*3/uL (ref 0.2–0.9)
Monocytes Relative: 6 %
Neutro Abs: 6.7 10*3/uL — ABNORMAL HIGH (ref 1.4–6.5)
Neutrophils Relative %: 69 %
Platelets: 230 10*3/uL (ref 150–440)
RBC: 4.92 MIL/uL (ref 3.80–5.20)
RDW: 13.8 % (ref 11.5–14.5)
WBC: 9.7 10*3/uL (ref 3.6–11.0)

## 2015-05-09 MED ORDER — ANASTROZOLE 1 MG PO TABS: 1.0000 mg | ORAL_TABLET | Freq: Every day | ORAL | Status: DC

## 2015-05-09 NOTE — Progress Notes (Signed)
Pt here today for annual follow up regarding breast cancer; offers no complaints today; mammogram 07/05/15

## 2015-05-09 NOTE — Progress Notes (Signed)
[facility] day:  05/09/2015  Chief Complaint: Darren Nodal is a 59 y.o. female with a history of stage IIB left breast cancer who is seen for 4 month ssessment.  HPI: The patient was last seen in the medical oncology clinic on 01/07/2015.  At that time,  she was seen for initial assessment by me.  Clinically, she was doing well on Arimidex.  She had some arthritis in her knees and shoulders.  Exam was unremarkable.  CA27.29 was 14.6 (normal).  We discussed port-a-cath removal.  During the interim, she has continued to do well.  She describes a few aches with her arthritis.  She continues her Armidex.  She continues monthly breast exams.  She denies any breast concerns.  She had her port removed by Dr. Bary Castilla.  Past Medical History  Diagnosis Date  . Hyperlipidemia   . Hypertension   . Morbid obesity   . Elevated glucose 05/2003    106  . Allergy   . Arthritis     s/p knee injection per ortho  . Mammographic microcalcification   . Malignant neoplasm of upper-outer quadrant of female breast July 20, 2013    histologic grade 3 invasive mammary carcinoma, 13 mm,  2/16 nodes positive, ER-positive, PR positive, HER-2/neu not amplified.T1c,N1a  . Breast cancer     Past Surgical History  Procedure Laterality Date  . Knee arthroscopy  03/04/2007    left Dr Derrel Nip  . Joint replacement  07/30/2007    LTKR Dr Derrel Nip  . Tubal ligation  ~1986    BTL  . Colonoscopy  2007  . Breast surgery Right 07-20-13    Wide excision, SLN biopsy, axillary dissection.  . Portacath placement  2014    Family History  Problem Relation Age of Onset  . Heart disease Mother     MI  . Depression Mother     paranoia  . Hypertension Mother   . Stroke Mother   . Heart disease Brother     MI PTCA at 16  . Heart disease Father   . Colon cancer Neg Hx   . Stomach cancer Maternal Grandfather 23  . Breast cancer Maternal Aunt   . Breast cancer Cousin      Social History:  reports that she has never smoked. She has never used smokeless tobacco. She reports that she does not drink alcohol or use illicit drugs.  The patient is alone today.  Allergies:  Allergies  Allergen Reactions  . Lisinopril Cough  . Sulfonamide Derivatives Hives    REACTION: itching years ago  . Latex Rash    tape    Current Medications: Current Outpatient Prescriptions  Medication Sig Dispense Refill  . anastrozole (ARIMIDEX) 1 MG tablet Take 1 mg by mouth daily.     . Calcium Carbonate-Vitamin D (CALCIUM + D PO) Take 600 mg by mouth 2 (two) times daily.    [name] ibuprofen (ADVIL,MOTRIN) 200 MG tablet Take 200 mg by mouth every 6 (six) hours as needed.    . metoprolol (LOPRESSOR) 100 MG tablet TAKE ONE TABLET BY MOUTH TWICE DAILY 180 tablet 0  . omeprazole (PRILOSEC) 20 MG capsule Take 20 mg by mouth daily.    . simvastatin (ZOCOR) 40 MG tablet TAKE ONE TABLET BY MOUTH AT BEDTIME 90 tablet 0  . ergocalciferol (VITAMIN D2) 50000 UNITS capsule Take 50,000 Units by mouth every 30 (thirty) days.    . simvastatin (ZOCOR) 40 MG tablet  TAKE ONE TABLET BY MOUTH AT BEDTIME (Patient not taking: Reported on 05/09/2015) 90 tablet 0   No current facility-administered medications for this visit.    Review of Systems:  GENERAL:  Feels good.  Active.  No fevers, sweats or weight loss. PERFORMANCE STATUS (ECOG):  0 HEENT:  No visual changes, runny nose, sore throat, mouth sores or tenderness. Lungs: No shortness of breath or cough.  No hemoptysis. Cardiac:  No chest pain, palpitations, orthopnea, or PND. GI:  No nausea, vomiting, diarrhea, constipation, melena or hematochezia. GU:  No urgency, frequency, dysuria, or hematuria. Musculoskeletal:  Few aches secondary to arthritis.  No back pain.  No muscle tenderness. Extremities:  No pain or swelling. Skin:  No rashes or skin changes. Neuro:  No headache, numbness or weakness, balance or coordination issues. Endocrine:  No  diabetes, thyroid issues, hot flashes or night sweats. Psych:  No mood changes, depression or anxiety. Pain:  No focal pain. Review of systems:  All other systems reviewed and found to be negative.  Physical Exam: Blood pressure 134/83, pulse 74, temperature 98.9 F (37.2 C), temperature source Tympanic, height _0  (1.651 m), weight 285 lb 7.9 oz (129.5 kg). GENERAL:  Well developed, well nourished, sitting comfortably in the exam room in no acute distress. MENTAL STATUS:  Alert and oriented to person, place and time. HEAD:  Short hair.  Normocephalic, atraumatic, face symmetric, no Cushingoid features. EYES:  Blue eyes.  Pupils equal round and reactive to light and accomodation.  No conjunctivitis or scleral icterus. ENT:  Oropharynx clear without lesion.  Tongue normal. Mucous membranes moist.  CHEST:  Port removal incision well healed left upper anterior chest. RESPIRATORY:  Clear to auscultation without rales, wheezes or rhonchi. CARDIOVASCULAR:  Regular rate and rhythm without murmur, rub or gallop. BREAST:  Right breast with well healed incision at the 3 o'clock position.  Mild post radiation changes.  Left breast with minimal fibrocystic changes in the lower inner quadrant.  No masses, skin changes or nipple discharge.  ABDOMEN:  Soft, non-tender, with active bowel sounds, and no hepatosplenomegaly.  No masses. BACK:  No tenderness on percussion of the back or rib cage. SKIN:  No rashes, ulcers or lesions. EXTREMITIES: No edema, no skin discoloration or tenderness.  No palpable cords. LYMPH NODES: No palpable cervical, supraclavicular, axillary or inguinal adenopathy  NEUROLOGICAL: Unremarkable. PSYCH:  Appropriate.   Appointment on 05/09/2015  Component Date Value Ref Range Status  . WBC 05/09/2015 9.7  3.6 - 11.0 K/uL Final  . RBC 05/09/2015 4.92  3.80 - 5.20 MIL/uL Final  . Hemoglobin 05/09/2015 14.6  12.0 - 16.0 g/dL Final  . HCT 05/09/2015 43.9  35.0 - 47.0 % Final  . MCV  05/09/2015 89.3  80.0 - 100.0 fL Final  . MCH 05/09/2015 29.6  26.0 - 34.0 pg Final  . MCHC 05/09/2015 33.2  32.0 - 36.0 g/dL Final  . RDW 05/09/2015 13.8  11.5 - 14.5 % Final  . Platelets 05/09/2015 230  150 - 440 K/uL Final  . Neutrophils Relative % 05/09/2015 69   Final  . Neutro Abs 05/09/2015 6.7* 1.4 - 6.5 K/uL Final  . Lymphocytes Relative 05/09/2015 22   Final  . Lymphs Abs 05/09/2015 2.1  1.0 - 3.6 K/uL Final  . Monocytes Relative 05/09/2015 6   Final  . Monocytes Absolute 05/09/2015 0.6  0.2 - 0.9 K/uL Final  . Eosinophils Relative 05/09/2015 2   Final  . Eosinophils Absolute 05/09/2015 0.2  0 - 0.7 K/uL Final  . Basophils Relative 05/09/2015 1   Final  . Basophils Absolute 05/09/2015 0.1  0 - 0.1 K/uL Final    Assessment:  Allien Melberg is a 59 y.o. female with a history of stage IIB right breast s/p lumpectomy on 05/2013.  Pathology revealed a grade III 1.3 cm lesion with 2 of 16 lymph nodes positive.  Pathologic stage was T1cN1M0 lesion.  Tumor was ER+/PR+ and Her2/neu- .  She enrolled on NSABP B47 clinical trial.  She received 4 cycles of TC (shortened course due to adverse events) from 07/28/2013 to 10/27/2013.  She received breast radiation from 12/18/2013 to 02/06/2014.  In 2015, she took Femara (letrozole) x 1 month.  She was switched to Arimidex secondary to dysgeusia (metallic taste).  She is tolerating Arimidex well.  Mammogram on 07/03/2014 revealed evolution of benign appearing calcifications in the right lower inner quadrant consistent fat necrosis.  Bone density study on 11/15/2014 revealed osteopenia (T-score -1.2 in right femur).  She is on calcium and vitamin D.  Clinically, she is doing well. Exam is stable.  Plan: 1. Labs today:  CBC with diff, CMP, CA27.29. 2. Refill Arimidex 1 mg a day; dis: 90 day supply with refills 3. Annual PAP smear next month per patient. 4. Bilateral mammogram scheduled for 07/05/2015 5. RTC in 4 months for MD assessment  and labs (CBC with diff, CMP, CA27.29).   Lequita Asal, MD  05/09/2015, 10:14 AM

## 2015-05-10 LAB — CANCER ANTIGEN 27.29: CA 27.29: 12.4 U/mL (ref 0.0–38.6)

## 2015-06-14 ENCOUNTER — Other Ambulatory Visit: Payer: Self-pay | Admitting: Family Medicine

## 2015-06-14 ENCOUNTER — Other Ambulatory Visit (INDEPENDENT_AMBULATORY_CARE_PROVIDER_SITE_OTHER): Payer: BLUE CROSS/BLUE SHIELD

## 2015-06-14 DIAGNOSIS — R739 Hyperglycemia, unspecified: Secondary | ICD-10-CM

## 2015-06-14 LAB — LIPID PANEL
Cholesterol: 161 mg/dL (ref 0–200)
HDL: 34.3 mg/dL — ABNORMAL LOW (ref >39.00)
LDL Cholesterol: 99 mg/dL (ref 0–99)
NonHDL: 126.77
Total CHOL/HDL Ratio: 5
Triglycerides: 139 mg/dL (ref 0.0–149.0)
VLDL: 27.8 mg/dL (ref 0.0–40.0)

## 2015-06-21 ENCOUNTER — Ambulatory Visit (INDEPENDENT_AMBULATORY_CARE_PROVIDER_SITE_OTHER): Payer: BLUE CROSS/BLUE SHIELD | Admitting: Family Medicine

## 2015-06-21 ENCOUNTER — Encounter: Payer: Self-pay | Admitting: Family Medicine

## 2015-06-21 VITALS — BP 118/64 | HR 70 | Temp 98.1°F | Ht 65.0 in | Wt 289.0 lb

## 2015-06-21 DIAGNOSIS — I10 Essential (primary) hypertension: Secondary | ICD-10-CM

## 2015-06-21 DIAGNOSIS — E785 Hyperlipidemia, unspecified: Secondary | ICD-10-CM

## 2015-06-21 DIAGNOSIS — M179 Osteoarthritis of knee, unspecified: Secondary | ICD-10-CM | POA: Diagnosis not present

## 2015-06-21 DIAGNOSIS — Z7189 Other specified counseling: Secondary | ICD-10-CM

## 2015-06-21 DIAGNOSIS — Z0000 Encounter for general adult medical examination without abnormal findings: Secondary | ICD-10-CM | POA: Diagnosis not present

## 2015-06-21 DIAGNOSIS — R739 Hyperglycemia, unspecified: Secondary | ICD-10-CM

## 2015-06-21 DIAGNOSIS — M1710 Unilateral primary osteoarthritis, unspecified knee: Secondary | ICD-10-CM

## 2015-06-21 DIAGNOSIS — M25512 Pain in left shoulder: Secondary | ICD-10-CM

## 2015-06-21 DIAGNOSIS — C50311 Malignant neoplasm of lower-inner quadrant of right female breast: Secondary | ICD-10-CM

## 2015-06-21 DIAGNOSIS — Z119 Encounter for screening for infectious and parasitic diseases, unspecified: Secondary | ICD-10-CM

## 2015-06-21 MED ORDER — SIMVASTATIN 40 MG PO TABS: 40.0000 mg | ORAL_TABLET | Freq: Every day | ORAL | Status: DC

## 2015-06-21 MED ORDER — METOPROLOL TARTRATE 100 MG PO TABS: 100.0000 mg | ORAL_TABLET | Freq: Two times a day (BID) | ORAL | Status: DC

## 2015-06-21 MED ORDER — TRAMADOL HCL 50 MG PO TABS: 50.0000 mg | ORAL_TABLET | Freq: Three times a day (TID) | ORAL | Status: DC | PRN

## 2015-06-21 NOTE — Progress Notes (Signed)
Pre visit review using our clinic review tool, if applicable. No additional management support is needed unless otherwise documented below in the visit note.  CPE- See plan.  Routine anticipatory guidance given to patient.  See health maintenance. Tetanus 2013 PNA due at 63.  Shingles shot d/w pt.   Flu shot encouraged. Usually done at work.  Colonoscopy 2015 Mammogram comes through cancer and surgery clinic.  Pap 2015 DXA not due yet. d/w pt.  Diet and exercise d/w pt. "Still terrible." Encouraged. She'll get back to the gym, encouraged. Her knee pain is the main issue here.  See below.   Living will d/w pt. Would have daughter Estill Bamberg designated if she were incapacitated. Opts in for HCV and HIV with next set of labs.  D/w pt about screening.    L shoulder pain and R>L knee pain.  Failed otc tylenol and nsaids.  Injections didn't help.  L TKR prev.  She isn't at the point of R knee replacement.  Asking about options.    Hypertension:    Using medication without problems or lightheadedness: yes Chest pain with exertion:no Edema:no Short of breath: likely from deconditioning per patient.   Elevated Cholesterol: Using medications without problems:yes Muscle aches: no Diet compliance:encouraged.   Exercise:encouraged Other complaints:  Breast cancer per onc and surgery clinic.   PMH and SH reviewed  Meds, vitals, and allergies reviewed.   ROS: See HPI.  Otherwise negative.    GEN: nad, alert and oriented HEENT: mucous membranes moist NECK: supple w/o LA CV: rrr. PULM: ctab, no inc wob ABD: soft, +bs EXT: no edema SKIN: no acute rash R knee with medial joint line pain and obvious crepitus on ROM.  No bruising, not red.  Slightly puffy mediall.   L shoulder with pain on int>ext rotation.  No arm drop.  + impingement.

## 2015-06-21 NOTE — Patient Instructions (Addendum)
Check with your insurance to see if they will cover the shingles shot. I would get a flu shot each fall.   Don't change your regular meds.   Add on tramadol if needed.  It can make you drowsy.   Take care.  Glad to see you.

## 2015-06-23 DIAGNOSIS — M179 Osteoarthritis of knee, unspecified: Secondary | ICD-10-CM | POA: Insufficient documentation

## 2015-06-23 DIAGNOSIS — M1710 Unilateral primary osteoarthritis, unspecified knee: Secondary | ICD-10-CM | POA: Insufficient documentation

## 2015-06-23 DIAGNOSIS — M25512 Pain in left shoulder: Secondary | ICD-10-CM | POA: Insufficient documentation

## 2015-06-23 DIAGNOSIS — Z7189 Other specified counseling: Secondary | ICD-10-CM | POA: Insufficient documentation

## 2015-06-23 NOTE — Assessment & Plan Note (Signed)
Routine anticipatory guidance given to patient.  See health maintenance. Tetanus 2013 PNA due at 63.  Shingles shot d/w pt.   Flu shot encouraged. Usually done at work.  Colonoscopy 2015 Mammogram comes through cancer and surgery clinic.  Pap 2015 DXA not due yet. d/w pt.  Diet and exercise d/w pt. "Still terrible." Encouraged. She'll get back to the gym, encouraged. Her knee pain is the main issue here.  See below.   Living will d/w pt. Would have daughter Estill Bamberg designated if she were incapacitated. Opts in for HCV and HIV with next set of labs.  D/w pt about screening.

## 2015-06-23 NOTE — Assessment & Plan Note (Signed)
Recheck A1c with next set of labs, prev labs d/w pt, d/w pt about diet and weight.

## 2015-06-23 NOTE — Assessment & Plan Note (Signed)
Per surgery/oncology.  App help of all involved.

## 2015-06-23 NOTE — Assessment & Plan Note (Signed)
Likely.  D/w pt. Needs weight reduction.  D/w pt.  Can try tramadol with routine cautions. She agrees. Update me as needed.

## 2015-06-23 NOTE — Assessment & Plan Note (Signed)
Continue statin for now, d/w pt about diet and weight.   Labs d/w pt.

## 2015-06-23 NOTE — Assessment & Plan Note (Signed)
Continue BB for now, d/w pt about diet and weight.

## 2015-06-23 NOTE — Assessment & Plan Note (Signed)
Likely cuff pathology, d/w pt about home exercises and anatomy.  Can try tramadol prn.

## 2015-07-05 ENCOUNTER — Other Ambulatory Visit: Payer: Self-pay | Admitting: Hematology and Oncology

## 2015-07-05 ENCOUNTER — Ambulatory Visit: Payer: Self-pay

## 2015-07-05 ENCOUNTER — Ambulatory Visit
Admission: RE | Admit: 2015-07-05 | Discharge: 2015-07-05 | Disposition: A | Discharge status: Home / Self Care | Payer: BLUE CROSS/BLUE SHIELD | Source: Ambulatory Visit | Attending: Hematology and Oncology | Admitting: Hematology and Oncology

## 2015-07-05 DIAGNOSIS — Z853 Personal history of malignant neoplasm of breast: Secondary | ICD-10-CM | POA: Insufficient documentation

## 2015-07-16 ENCOUNTER — Ambulatory Visit (INDEPENDENT_AMBULATORY_CARE_PROVIDER_SITE_OTHER): Payer: BLUE CROSS/BLUE SHIELD | Admitting: General Surgery

## 2015-07-16 ENCOUNTER — Encounter: Payer: Self-pay | Admitting: General Surgery

## 2015-07-16 VITALS — BP 130/74 | HR 74 | Resp 12 | Ht 65.0 in | Wt 287.0 lb

## 2015-07-16 DIAGNOSIS — C50311 Malignant neoplasm of lower-inner quadrant of right female breast: Secondary | ICD-10-CM | POA: Diagnosis not present

## 2015-07-16 NOTE — Progress Notes (Signed)
Patient ID: Tina Delgado, female   DOB: 1956-04-15, 59 y.o.   MRN: [id number]  Chief Complaint  Patient presents with  . Follow-up    breast cancer    HPI Tina Delgado is a 59 y.o. female who presents for a breast evaluation. The most recent mammogram was done on 07/05/15.  Patient does perform regular self breast checks and gets regular mammograms done.    HPI  Past Medical History  Diagnosis Date  . Hyperlipidemia   . Hypertension   . Morbid obesity   . Elevated glucose 05/2003    106  . Allergy   . Arthritis     s/p knee injection per ortho  . Mammographic microcalcification   . Malignant neoplasm of upper-outer quadrant of female breast July 20, 2013    histologic grade 3 invasive mammary carcinoma, 13 mm,  2/16 nodes positive, ER-positive, PR positive, HER-2/neu not amplified.T1c,N1a  . Breast cancer     Past Surgical History  Procedure Laterality Date  . Knee arthroscopy  03/04/2007    left Dr Derrel Nip  . Joint replacement  07/30/2007    LTKR Dr Derrel Nip  . Tubal ligation  ~1986    BTL  . Colonoscopy  [id number]  . Breast surgery Right 07-20-13    Wide excision, SLN biopsy, axillary dissection.  . Portacath placement  2014  . Breast excisional biopsy Right     positive 05/2013  . S/p chemotherapy Right     right breast cancer   . S/p radiation therapy Right     right breast cancer    Family History  Problem Relation Age of Onset  . Heart disease Mother     MI  . Depression Mother     paranoia  . Hypertension Mother   . Stroke Mother   . Parkinsonism Mother   . Heart disease Brother     MI PTCA at 73  . Heart disease Father   . Colon cancer Neg Hx   . Stomach cancer Maternal Grandfather 16  . Breast cancer Maternal Aunt   . Breast cancer Cousin     Social History Social History  Substance Use Topics  . Smoking status: Never Smoker   . Smokeless tobacco: Never Used  . Alcohol Use: No     Comment: rare    Allergies  Allergen  Reactions  . Lisinopril Cough  . Sulfonamide Derivatives Hives    REACTION: itching years ago  . Latex Rash    tape    Current Outpatient Prescriptions  Medication Sig Dispense Refill  . anastrozole (ARIMIDEX) 1 MG tablet Take 1 tablet (1 mg total) by mouth daily. 90 tablet 1  . Calcium Carbonate-Vitamin D (CALCIUM + D PO) Take 600 mg by mouth 2 (two) times daily.    [name] ibuprofen (ADVIL,MOTRIN) 200 MG tablet Take 200 mg by mouth every 6 (six) hours as needed.    . metoprolol (LOPRESSOR) 100 MG tablet Take 1 tablet (100 mg total) by mouth 2 (two) times daily. 180 tablet 3  . omeprazole (PRILOSEC) 20 MG capsule Take 20 mg by mouth daily.    . simvastatin (ZOCOR) 40 MG tablet Take 1 tablet (40 mg total) by mouth at bedtime. 90 tablet 3  . traMADol (ULTRAM) 50 MG tablet Take 1 tablet (50 mg total) by mouth every 8 (eight) hours as needed. 30 tablet 1   No current facility-administered medications for this visit.    Review of Systems Review of Systems  Constitutional:  Negative.   Respiratory: Negative.   Cardiovascular: Negative.     Blood pressure 130/74, pulse 74, resp. rate 12, height 5' 5"  (1.651 m), weight 287 lb (130.182 kg).  Physical Exam Physical Exam  Constitutional: She is oriented to person, place, and time. She appears well-developed and well-nourished.  Eyes: Conjunctivae are normal. No scleral icterus.  Neck: Neck supple.  Cardiovascular: Normal rate, regular rhythm and normal heart sounds.   Pulmonary/Chest: Effort normal and breath sounds normal. Right breast exhibits no inverted nipple, no mass, no nipple discharge, no skin change and no tenderness. Left breast exhibits no inverted nipple, no mass, no nipple discharge, no skin change and no tenderness.    Right breast well healed incision at 3 o'clock .   Lymphadenopathy:    She has no cervical adenopathy.    She has no axillary adenopathy.  Neurological: She is alert and oriented to person, place, and time.   Skin: Skin is warm and dry.    Data Reviewed Bilateral diagnostic mammograms dated 07/05/2015 were reviewed and compared to previous studies. Independently reviewed. BI-RADS-2.  Assessment    Doing well status post right breast conservation.    Plan        Patient will be asked to return to the office in one year with a bilateral diagnotic mammogram. PCP:  Azucena Freed 07/17/2015, 6:39 AM

## 2015-07-16 NOTE — Patient Instructions (Addendum)
The patient has been asked to return to the office in one year with a bilateral diagnostic mammogram.Continue self breast exams. Call office for any new breast issues or concerns.  

## 2015-09-09 ENCOUNTER — Other Ambulatory Visit: Payer: BLUE CROSS/BLUE SHIELD

## 2015-09-09 ENCOUNTER — Ambulatory Visit: Payer: BLUE CROSS/BLUE SHIELD | Admitting: Hematology and Oncology

## 2015-09-13 DIAGNOSIS — C50311 Malignant neoplasm of lower-inner quadrant of right female breast: Secondary | ICD-10-CM

## 2015-09-13 NOTE — Progress Notes (Signed)
I contacted patient at [phone] to complete annual Medical Conditions and Lifestyle Questionnaire as required for her continued studied participation in [employer] B-47.  Patient answered questions via telephone, responses recorded and survey sent to [employer].  I thanked patient for her time.

## 2015-10-07 ENCOUNTER — Inpatient Hospital Stay: Payer: BLUE CROSS/BLUE SHIELD | Attending: Hematology and Oncology | Admitting: Hematology and Oncology

## 2015-10-07 ENCOUNTER — Encounter: Payer: Self-pay | Admitting: Hematology and Oncology

## 2015-10-07 ENCOUNTER — Inpatient Hospital Stay: Payer: BLUE CROSS/BLUE SHIELD

## 2015-10-07 ENCOUNTER — Other Ambulatory Visit: Payer: Self-pay

## 2015-10-07 VITALS — BP 156/94 | HR 80 | Temp 97.8°F | Resp 18 | Ht 65.0 in | Wt 288.1 lb

## 2015-10-07 DIAGNOSIS — E785 Hyperlipidemia, unspecified: Secondary | ICD-10-CM | POA: Diagnosis not present

## 2015-10-07 DIAGNOSIS — C50311 Malignant neoplasm of lower-inner quadrant of right female breast: Secondary | ICD-10-CM

## 2015-10-07 DIAGNOSIS — Z170 Estrogen receptor positive status [ER+]: Secondary | ICD-10-CM | POA: Diagnosis not present

## 2015-10-07 DIAGNOSIS — C50411 Malignant neoplasm of upper-outer quadrant of right female breast: Secondary | ICD-10-CM | POA: Diagnosis not present

## 2015-10-07 DIAGNOSIS — I10 Essential (primary) hypertension: Secondary | ICD-10-CM | POA: Diagnosis not present

## 2015-10-07 DIAGNOSIS — Z9221 Personal history of antineoplastic chemotherapy: Secondary | ICD-10-CM | POA: Diagnosis not present

## 2015-10-07 DIAGNOSIS — M1990 Unspecified osteoarthritis, unspecified site: Secondary | ICD-10-CM | POA: Diagnosis not present

## 2015-10-07 DIAGNOSIS — C779 Secondary and unspecified malignant neoplasm of lymph node, unspecified: Secondary | ICD-10-CM | POA: Insufficient documentation

## 2015-10-07 DIAGNOSIS — Z79811 Long term (current) use of aromatase inhibitors: Secondary | ICD-10-CM | POA: Diagnosis not present

## 2015-10-07 DIAGNOSIS — Z923 Personal history of irradiation: Secondary | ICD-10-CM | POA: Insufficient documentation

## 2015-10-07 DIAGNOSIS — M8580 Other specified disorders of bone density and structure, unspecified site: Secondary | ICD-10-CM | POA: Insufficient documentation

## 2015-10-07 DIAGNOSIS — N641 Fat necrosis of breast: Secondary | ICD-10-CM | POA: Diagnosis not present

## 2015-10-07 DIAGNOSIS — Z79899 Other long term (current) drug therapy: Secondary | ICD-10-CM | POA: Insufficient documentation

## 2015-10-07 LAB — COMPREHENSIVE METABOLIC PANEL
ALT: 24 U/L (ref 14–54)
AST: 23 U/L (ref 15–41)
Albumin: 4 g/dL (ref 3.5–5.0)
Alkaline Phosphatase: 90 U/L (ref 38–126)
Anion gap: 7 (ref 5–15)
BUN: 14 mg/dL (ref 6–20)
CO2: 28 mmol/L (ref 22–32)
Calcium: 9.1 mg/dL (ref 8.9–10.3)
Chloride: 100 mmol/L — ABNORMAL LOW (ref 101–111)
Creatinine, Ser: 0.83 mg/dL (ref 0.44–1.00)
GFR calc Af Amer: >60 mL/min (ref >60)
GFR calc non Af Amer: >60 mL/min (ref >60)
Glucose, Bld: 111 mg/dL — ABNORMAL HIGH (ref 65–99)
Potassium: 4 mmol/L (ref 3.5–5.1)
Sodium: 135 mmol/L (ref 135–145)
Total Bilirubin: 0.4 mg/dL (ref 0.3–1.2)
Total Protein: 7.5 g/dL (ref 6.5–8.1)

## 2015-10-07 LAB — CBC WITH DIFFERENTIAL/PLATELET
Basophils Absolute: 0.1 10*3/uL (ref 0–0.1)
Basophils Relative: 1 %
Eosinophils Absolute: 0.3 10*3/uL (ref 0–0.7)
Eosinophils Relative: 3 %
HCT: 45.9 % (ref 35.0–47.0)
Hemoglobin: 15.3 g/dL (ref 12.0–16.0)
Lymphocytes Relative: 21 %
Lymphs Abs: 2 10*3/uL (ref 1.0–3.6)
MCH: 29.2 pg (ref 26.0–34.0)
MCHC: 33.4 g/dL (ref 32.0–36.0)
MCV: 87.7 fL (ref 80.0–100.0)
Monocytes Absolute: 0.6 10*3/uL (ref 0.2–0.9)
Monocytes Relative: 6 %
Neutro Abs: 6.5 10*3/uL (ref 1.4–6.5)
Neutrophils Relative %: 69 %
Platelets: 213 10*3/uL (ref 150–440)
RBC: 5.23 MIL/uL — ABNORMAL HIGH (ref 3.80–5.20)
RDW: 13.7 % (ref 11.5–14.5)
WBC: 9.4 10*3/uL (ref 3.6–11.0)

## 2015-10-07 NOTE — Progress Notes (Signed)
[facility] day:  10/07/2015  Chief Complaint: Tina Delgado is a 59 y.o. female with a history of stage IIB left breast cancer who is seen for 4 month ssessment.  HPI: The patient was last seen in the medical oncology clinic on 05/09/2015.  At that time,  she was seen for 4 month assessment.  She described a few aches with her arthritis.  She continued her Armidex.  She denied any breast concerns.  CBC with diff, CMP, and CA27.29 were normal.  Bilateral mammogram on 07/05/2015 revealed no evidence for malignancy.  During the interim, she has done well.  She continues to have a little arthritis for which she was placed on a medicine by her PCP, but discontinued as it was ineffective. She denies any breast concerns.  She continues monthly breast exams.  She is taking her calcium and vitamin D.   Past Medical History  Diagnosis Date  . Hyperlipidemia   . Hypertension   . Morbid obesity   . Elevated glucose 05/2003    106  . Allergy   . Arthritis     s/p knee injection per ortho  . Mammographic microcalcification   . Malignant neoplasm of upper-outer quadrant of female breast July 20, 2013    histologic grade 3 invasive mammary carcinoma, 13 mm,  2/16 nodes positive, ER-positive, PR positive, HER-2/neu not amplified.T1c,N1a  . Breast cancer     Past Surgical History  Procedure Laterality Date  . Knee arthroscopy  03/04/2007    left Dr Derrel Nip  . Joint replacement  07/30/2007    LTKR Dr Derrel Nip  . Tubal ligation  ~1986    BTL  . Colonoscopy  [id number]  . Breast surgery Right 07-20-13    Wide excision, SLN biopsy, axillary dissection.  . Portacath placement  2014  . Breast excisional biopsy Right     positive 05/2013  . S/p chemotherapy Right     right breast cancer   . S/p radiation therapy Right     right breast cancer    Family History  Problem Relation Age of Onset  . Heart disease Mother     MI  . Depression Mother      paranoia  . Hypertension Mother   . Stroke Mother   . Parkinsonism Mother   . Heart disease Brother     MI PTCA at 36  . Heart disease Father   . Colon cancer Neg Hx   . Stomach cancer Maternal Grandfather 73  . Breast cancer Maternal Aunt   . Breast cancer Cousin     Social History:  reports that she has never smoked. She has never used smokeless tobacco. She reports that she does not drink alcohol or use illicit drugs.  The patient is alone today.  Allergies:  Allergies  Allergen Reactions  . Lisinopril Cough  . Sulfonamide Derivatives Hives    REACTION: itching years ago  . Latex Rash    tape    Current Medications: Current Outpatient Prescriptions  Medication Sig Dispense Refill  . anastrozole (ARIMIDEX) 1 MG tablet Take 1 tablet (1 mg total) by mouth daily. 90 tablet 1  . Calcium Carbonate-Vitamin D (CALCIUM + D PO) Take 600 mg by mouth 2 (two) times daily.    [name] ibuprofen (ADVIL,MOTRIN) 200 MG tablet Take 200 mg by mouth every 6 (six) hours as needed.    . metoprolol (LOPRESSOR) 100 MG tablet Take 1 tablet (100 mg total) by  mouth 2 (two) times daily. 180 tablet 3  . omeprazole (PRILOSEC) 20 MG capsule Take 20 mg by mouth daily.    . simvastatin (ZOCOR) 40 MG tablet Take 1 tablet (40 mg total) by mouth at bedtime. 90 tablet 3  . Calcium Carbonate-Vitamin D (CALCIUM 500 + D) 500-125 MG-UNIT TABS Take by mouth.    . loratadine (CLARITIN) 10 MG tablet Take by mouth.    . Vitamin D, Ergocalciferol, (DRISDOL) 50000 UNITS CAPS capsule Take by mouth.     No current facility-administered medications for this visit.    Review of Systems:  GENERAL:  Feels good.  Active.  No fevers, sweats or weight loss. PERFORMANCE STATUS (ECOG):  0 HEENT:  No visual changes, runny nose, sore throat, mouth sores or tenderness. Lungs: No shortness of breath or cough.  No hemoptysis. Cardiac:  No chest pain, palpitations, orthopnea, or PND. GI:  No nausea, vomiting, diarrhea, constipation,  melena or hematochezia. GU:  No urgency, frequency, dysuria, or hematuria. Musculoskeletal:  Mild arthritis.  No back pain.  No muscle tenderness. Extremities:  No pain or swelling. Skin:  No rashes or skin changes. Neuro:  No headache, numbness or weakness, balance or coordination issues. Endocrine:  No diabetes, thyroid issues, hot flashes or night sweats. Psych:  No mood changes, depression or anxiety. Pain:  No focal pain. Review of systems:  All other systems reviewed and found to be negative.  Physical Exam: Blood pressure 156/94, pulse 80, temperature 97.8 F (36.6 C), temperature source Tympanic, resp. rate 18, height 5' 5"  (1.651 m), weight 288 lb 2.3 oz (130.7 kg). GENERAL:  Well developed, well nourished, sitting comfortably in the exam room in no acute distress. MENTAL STATUS:  Alert and oriented to person, place and time. HEAD:  Short gray hair.  Normocephalic, atraumatic, face symmetric, no Cushingoid features. EYES:  Blue eyes.  Pupils equal round and reactive to light and accomodation.  No conjunctivitis or scleral icterus. ENT:  Oropharynx clear without lesion.  Tongue normal. Mucous membranes moist.  RESPIRATORY:  Clear to auscultation without rales, wheezes or rhonchi. CARDIOVASCULAR:  Regular rate and rhythm without murmur, rub or gallop. BREAST:  Right breast with well healed incision at the 3 o'clock position.  Mild post radiation changes.  Left breast with minimal fibrocystic changes in the lower inner quadrant.  No masses, skin changes or nipple discharge.  ABDOMEN:  Soft, non-tender, with active bowel sounds, and no hepatosplenomegaly.  No masses. SKIN:  No rashes, ulcers or lesions. EXTREMITIES: No edema, no skin discoloration or tenderness.  No palpable cords. LYMPH NODES: No palpable cervical, supraclavicular, axillary or inguinal adenopathy  NEUROLOGICAL: Unremarkable. PSYCH:  Appropriate.   Appointment on 10/07/2015  Component Date Value Ref Range Status   . WBC 10/07/2015 9.4  3.6 - 11.0 K/uL Final  . RBC 10/07/2015 5.23* 3.80 - 5.20 MIL/uL Final  . Hemoglobin 10/07/2015 15.3  12.0 - 16.0 g/dL Final  . HCT 10/07/2015 45.9  35.0 - 47.0 % Final  . MCV 10/07/2015 87.7  80.0 - 100.0 fL Final  . MCH 10/07/2015 29.2  26.0 - 34.0 pg Final  . MCHC 10/07/2015 33.4  32.0 - 36.0 g/dL Final  . RDW 10/07/2015 13.7  11.5 - 14.5 % Final  . Platelets 10/07/2015 213  150 - 440 K/uL Final  . Neutrophils Relative % 10/07/2015 69   Final  . Neutro Abs 10/07/2015 6.5  1.4 - 6.5 K/uL Final  . Lymphocytes Relative 10/07/2015 21   Final  .  Lymphs Abs 10/07/2015 2.0  1.0 - 3.6 K/uL Final  . Monocytes Relative 10/07/2015 6   Final  . Monocytes Absolute 10/07/2015 0.6  0.2 - 0.9 K/uL Final  . Eosinophils Relative 10/07/2015 3   Final  . Eosinophils Absolute 10/07/2015 0.3  0 - 0.7 K/uL Final  . Basophils Relative 10/07/2015 1   Final  . Basophils Absolute 10/07/2015 0.1  0 - 0.1 K/uL Final  . Sodium 10/07/2015 135  135 - 145 mmol/L Final  . Potassium 10/07/2015 4.0  3.5 - 5.1 mmol/L Final  . Chloride 10/07/2015 100* 101 - 111 mmol/L Final  . CO2 10/07/2015 28  22 - 32 mmol/L Final  . Glucose, Bld 10/07/2015 111* 65 - 99 mg/dL Final  . BUN 10/07/2015 14  6 - 20 mg/dL Final  . Creatinine, Ser 10/07/2015 0.83  0.44 - 1.00 mg/dL Final  . Calcium 10/07/2015 9.1  8.9 - 10.3 mg/dL Final  . Total Protein 10/07/2015 7.5  6.5 - 8.1 g/dL Final  . Albumin 10/07/2015 4.0  3.5 - 5.0 g/dL Final  . AST 10/07/2015 23  15 - 41 U/L Final  . ALT 10/07/2015 24  14 - 54 U/L Final  . Alkaline Phosphatase 10/07/2015 90  38 - 126 U/L Final  . Total Bilirubin 10/07/2015 0.4  0.3 - 1.2 mg/dL Final  . GFR calc non Af Amer 10/07/2015 >60  >60 mL/min Final  . GFR calc Af Amer 10/07/2015 >60  >60 mL/min Final   Comment: (NOTE) The eGFR has been calculated using the CKD EPI equation. This calculation has not been validated in all clinical situations. eGFR's persistently <60 mL/min  signify possible Chronic Kidney Disease.   . Anion gap 10/07/2015 7  5 - 15 Final    Assessment:  Tina Delgado is a 59 y.o. female with a history of stage IIB right breast s/p lumpectomy on 05/2013.  Pathology revealed a grade III 1.3 cm lesion with 2 of 16 lymph nodes positive.  Pathologic stage was T1cN1M0 lesion.  Tumor was ER+/PR+ and Her2/neu- .  She enrolled on NSABP B47 clinical trial.  She received 4 cycles of TC (shortened course due to adverse events) from 07/28/2013 to 10/27/2013.  She received breast radiation from 12/18/2013 to 02/06/2014.  In 2015, she took Femara (letrozole) x 1 month.  She was switched to Arimidex secondary to dysgeusia (metallic taste).  She is tolerating Arimidex well.  Mammogram on 07/03/2014 revealed evolution of benign appearing calcifications in the right lower inner quadrant consistent fat necrosis.  Bilateral mammogram on 07/05/2015 revealed no evidence for malignancy.  CA27.29 was 12.4 (0-38.6) on 05/09/2015.  Bone density study on 11/15/2014 revealed osteopenia (T-score -1.2 in right femur).  She is on calcium and vitamin D.  Clinically, she is doing well. Exam is stable.  Plan: 1. Labs today:  CBC with diff, CMP, CA27.29. 2. Review interval mammogram. 3. Continue Arimidex 1 mg a day. 4. RTC in 4 months for MD assessment and labs (CBC with diff, CMP, CA27.29).   Lequita Asal, MD  10/07/2015, 11:00 AM

## 2015-10-07 NOTE — Progress Notes (Signed)
Patient is here for follow-up of breast cancer. Patient states that overall she has been doing well. She does have some occasional constipation. Patient also has some arthritis pain. Last mammogram was in Sept. 2016.

## 2015-10-08 LAB — CANCER ANTIGEN 27.29: CA 27.29: 10.6 U/mL (ref 0.0–38.6)

## 2015-10-25 ENCOUNTER — Other Ambulatory Visit: Payer: Self-pay | Admitting: Hematology and Oncology

## 2015-12-12 ENCOUNTER — Telehealth: Payer: Self-pay

## 2015-12-12 DIAGNOSIS — M2550 Pain in unspecified joint: Secondary | ICD-10-CM

## 2015-12-12 NOTE — Telephone Encounter (Signed)
Pt left v/m; pt last annual exam on 06/21/15. Pt has arthritis in lt shoulder and both knees. Pt has been given med for this in the past but most recently pt having a lot of pain; pt had MRI which showed deterioting arthritis in shoulder; starting to affect pts hands and pt request referral to rheumatologist. Pt request cb.

## 2015-12-13 NOTE — Telephone Encounter (Signed)
Referral ordered.  Thanks

## 2015-12-25 DIAGNOSIS — M75100 Unspecified rotator cuff tear or rupture of unspecified shoulder, not specified as traumatic: Secondary | ICD-10-CM

## 2015-12-25 HISTORY — DX: Unspecified rotator cuff tear or rupture of unspecified shoulder, not specified as traumatic: M75.100

## 2015-12-27 ENCOUNTER — Other Ambulatory Visit: Payer: Self-pay | Admitting: Internal Medicine

## 2015-12-27 DIAGNOSIS — M25512 Pain in left shoulder: Secondary | ICD-10-CM

## 2016-01-01 ENCOUNTER — Ambulatory Visit
Admission: RE | Admit: 2016-01-01 | Discharge: 2016-01-01 | Disposition: A | Discharge status: Home / Self Care | Payer: BLUE CROSS/BLUE SHIELD | Source: Ambulatory Visit | Attending: Internal Medicine | Admitting: Internal Medicine

## 2016-01-01 DIAGNOSIS — M25512 Pain in left shoulder: Secondary | ICD-10-CM

## 2016-02-10 ENCOUNTER — Inpatient Hospital Stay: Payer: BLUE CROSS/BLUE SHIELD | Attending: Hematology and Oncology | Admitting: Hematology and Oncology

## 2016-02-10 ENCOUNTER — Inpatient Hospital Stay: Payer: BLUE CROSS/BLUE SHIELD

## 2016-02-10 ENCOUNTER — Other Ambulatory Visit: Payer: Self-pay

## 2016-02-10 ENCOUNTER — Other Ambulatory Visit: Payer: Self-pay | Admitting: Hematology and Oncology

## 2016-02-10 ENCOUNTER — Encounter: Payer: Self-pay | Admitting: Hematology and Oncology

## 2016-02-10 VITALS — BP 131/84 | HR 78 | Temp 98.5°F | Resp 20 | Wt 291.7 lb

## 2016-02-10 DIAGNOSIS — Z923 Personal history of irradiation: Secondary | ICD-10-CM | POA: Diagnosis not present

## 2016-02-10 DIAGNOSIS — I10 Essential (primary) hypertension: Secondary | ICD-10-CM | POA: Diagnosis not present

## 2016-02-10 DIAGNOSIS — E785 Hyperlipidemia, unspecified: Secondary | ICD-10-CM | POA: Diagnosis not present

## 2016-02-10 DIAGNOSIS — Z170 Estrogen receptor positive status [ER+]: Secondary | ICD-10-CM | POA: Diagnosis not present

## 2016-02-10 DIAGNOSIS — Z79899 Other long term (current) drug therapy: Secondary | ICD-10-CM | POA: Diagnosis not present

## 2016-02-10 DIAGNOSIS — Z9221 Personal history of antineoplastic chemotherapy: Secondary | ICD-10-CM

## 2016-02-10 DIAGNOSIS — M1990 Unspecified osteoarthritis, unspecified site: Secondary | ICD-10-CM | POA: Insufficient documentation

## 2016-02-10 DIAGNOSIS — C50411 Malignant neoplasm of upper-outer quadrant of right female breast: Secondary | ICD-10-CM

## 2016-02-10 DIAGNOSIS — Z79811 Long term (current) use of aromatase inhibitors: Secondary | ICD-10-CM

## 2016-02-10 DIAGNOSIS — Z803 Family history of malignant neoplasm of breast: Secondary | ICD-10-CM | POA: Insufficient documentation

## 2016-02-10 DIAGNOSIS — C50311 Malignant neoplasm of lower-inner quadrant of right female breast: Secondary | ICD-10-CM

## 2016-02-10 DIAGNOSIS — M8580 Other specified disorders of bone density and structure, unspecified site: Secondary | ICD-10-CM

## 2016-02-10 LAB — COMPREHENSIVE METABOLIC PANEL
ALT: 31 U/L (ref 14–54)
AST: 30 U/L (ref 15–41)
Albumin: 4.2 g/dL (ref 3.5–5.0)
Alkaline Phosphatase: 90 U/L (ref 38–126)
Anion gap: 3 — ABNORMAL LOW (ref 5–15)
BUN: 16 mg/dL (ref 6–20)
CO2: 34 mmol/L — ABNORMAL HIGH (ref 22–32)
Calcium: 9.7 mg/dL (ref 8.9–10.3)
Chloride: 101 mmol/L (ref 101–111)
Creatinine, Ser: 0.99 mg/dL (ref 0.44–1.00)
GFR calc Af Amer: >60 mL/min (ref >60)
GFR calc non Af Amer: >60 mL/min (ref >60)
Glucose, Bld: 109 mg/dL — ABNORMAL HIGH (ref 65–99)
Potassium: 4.5 mmol/L (ref 3.5–5.1)
Sodium: 138 mmol/L (ref 135–145)
Total Bilirubin: 0.6 mg/dL (ref 0.3–1.2)
Total Protein: 7.6 g/dL (ref 6.5–8.1)

## 2016-02-10 LAB — CBC WITH DIFFERENTIAL/PLATELET
Basophils Absolute: 0.1 10*3/uL (ref 0–0.1)
Basophils Relative: 1 %
Eosinophils Absolute: 0.3 10*3/uL (ref 0–0.7)
Eosinophils Relative: 3 %
HCT: 44.3 % (ref 35.0–47.0)
Hemoglobin: 15.2 g/dL (ref 12.0–16.0)
Lymphocytes Relative: 26 %
Lymphs Abs: 2.4 10*3/uL (ref 1.0–3.6)
MCH: 30.1 pg (ref 26.0–34.0)
MCHC: 34.3 g/dL (ref 32.0–36.0)
MCV: 87.8 fL (ref 80.0–100.0)
Monocytes Absolute: 0.6 10*3/uL (ref 0.2–0.9)
Monocytes Relative: 7 %
Neutro Abs: 5.9 10*3/uL (ref 1.4–6.5)
Neutrophils Relative %: 63 %
Platelets: 227 10*3/uL (ref 150–440)
RBC: 5.04 MIL/uL (ref 3.80–5.20)
RDW: 13.5 % (ref 11.5–14.5)
WBC: 9.4 10*3/uL (ref 3.6–11.0)

## 2016-02-10 NOTE — Progress Notes (Signed)
[facility] day:  02/10/2016  Chief Complaint: Tina Delgado is a 60 y.o. female with a history of stage IIB left breast cancer who is seen for 4 month ssessment.  HPI: The patient was last seen in the medical oncology clinic on 10/07/2015.  At that time, she was seen for 4 month assessment.  She was doing well except for issues with arthritis.  Exam was stable.  Labs including a CBC with diff, CMP, and CA27.29 (10.6) were normal.  During the interim, she has done well.  She is taking her Arimidex.  She is doing monthly exams.  She denies any breast concerns.  She has issues with arthritis (preceded AI therapy).  She also has issues with a left rotator cuff tear.  She states that she has had no trauma, but has been aggravated by helping take care of one of her parents.  She is taking calcium and vitamin D.   Past Medical History  Diagnosis Date  . Hyperlipidemia   . Hypertension   . Morbid obesity ([employer])   . Elevated glucose 05/2003    106  . Allergy   . Arthritis     s/p knee injection per ortho  . Mammographic microcalcification   . Malignant neoplasm of upper-outer quadrant of female breast [facility]) July 20, 2013    histologic grade 3 invasive mammary carcinoma, 13 mm,  2/16 nodes positive, ER-positive, PR positive, HER-2/neu not amplified.T1c,N1a  . Breast cancer [facility])     Past Surgical History  Procedure Laterality Date  . Knee arthroscopy  03/04/2007    left Dr Derrel Nip  . Joint replacement  07/30/2007    LTKR Dr Derrel Nip  . Tubal ligation  ~1986    BTL  . Colonoscopy  [id number]  . Breast surgery Right 07-20-13    Wide excision, SLN biopsy, axillary dissection.  . Portacath placement  2014  . Breast excisional biopsy Right     positive 05/2013  . S/p chemotherapy Right     right breast cancer   . S/p radiation therapy Right     right breast cancer    Family History  Problem Relation Age of Onset  . Heart disease  Mother     MI  . Depression Mother     paranoia  . Hypertension Mother   . Stroke Mother   . Parkinsonism Mother   . Heart disease Brother     MI PTCA at 67  . Heart disease Father   . Colon cancer Neg Hx   . Stomach cancer Maternal Grandfather 37  . Breast cancer Maternal Aunt   . Breast cancer Cousin     Social History:  reports that she has never smoked. She has never used smokeless tobacco. She reports that she does not drink alcohol or use illicit drugs.  The patient is alone today.  Allergies:  Allergies  Allergen Reactions  . Lisinopril Cough  . Sulfonamide Derivatives Hives    REACTION: itching years ago  . Latex Rash    tape    Current Medications: Current Outpatient Prescriptions  Medication Sig Dispense Refill  . anastrozole (ARIMIDEX) 1 MG tablet TAKE ONE TABLET BY MOUTH ONCE DAILY 90 tablet 0  . Calcium Carbonate-Vitamin D (CALCIUM + D PO) Take 600 mg by mouth 2 (two) times daily.    [name] loratadine (CLARITIN) 10 MG tablet Take by mouth.    . metoprolol (LOPRESSOR) 100 MG tablet Take 1 tablet (100  mg total) by mouth 2 (two) times daily. 180 tablet 3  . naproxen sodium (ANAPROX) 220 MG tablet Take 220 mg by mouth 2 (two) times daily with a meal.    . omeprazole (PRILOSEC) 20 MG capsule Take 20 mg by mouth daily.    . simvastatin (ZOCOR) 40 MG tablet Take 1 tablet (40 mg total) by mouth at bedtime. 90 tablet 3  . Calcium Carbonate-Vitamin D (CALCIUM 500 + D) 500-125 MG-UNIT TABS Take by mouth. Reported on 02/10/2016     No current facility-administered medications for this visit.    Review of Systems:  GENERAL:  Feels good.  Active.  No fevers, sweats or weight loss. PERFORMANCE STATUS (ECOG):  0 HEENT:  No visual changes, runny nose, sore throat, mouth sores or tenderness. Lungs: No shortness of breath or cough.  No hemoptysis. Cardiac:  No chest pain, palpitations, orthopnea, or PND. GI:  No nausea, vomiting, diarrhea, constipation, melena or  hematochezia. GU:  No urgency, frequency, dysuria, or hematuria. Musculoskeletal:  Arthritis.  Left rotator cuff tear.  No back pain.  No muscle tenderness. Extremities:  No pain or swelling. Skin:  No rashes or skin changes. Neuro:  No headache, numbness or weakness, balance or coordination issues. Endocrine:  No diabetes, thyroid issues, hot flashes or night sweats. Psych:  No mood changes, depression or anxiety. Pain:  No focal pain. Review of systems:  All other systems reviewed and found to be negative.  Physical Exam: Blood pressure 131/84, pulse 78, temperature 98.5 F (36.9 C), temperature source Oral, resp. rate 20, weight 291 lb 10.7 oz (132.3 kg). GENERAL:  Well developed, well nourished, heavy set woman sitting comfortably in the exam room in no acute distress. MENTAL STATUS:  Alert and oriented to person, place and time. HEAD:  Short gray hair.  Normocephalic, atraumatic, face symmetric, no Cushingoid features. EYES:  Blue eyes.  Pupils equal round and reactive to light and accomodation.  No conjunctivitis or scleral icterus. ENT:  Oropharynx clear without lesion.  Tongue normal. Mucous membranes moist.  RESPIRATORY:  Clear to auscultation without rales, wheezes or rhonchi. CARDIOVASCULAR:  Regular rate and rhythm without murmur, rub or gallop. BREAST:  Right breast with well healed incision at the 3 o'clock position.  Mild post radiation changes.  Left breast with minimal fibrocystic changes in the lower inner quadrant.  No masses, skin changes or nipple discharge.  ABDOMEN:  Soft, non-tender, with active bowel sounds, and no hepatosplenomegaly.  No masses. SKIN:  3 small abrasions between breasts caused by bra irritation.  No rashes, ulcers or lesions. EXTREMITIES: No edema, no skin discoloration or tenderness.  No palpable cords. LYMPH NODES: No palpable cervical, supraclavicular, axillary or inguinal adenopathy  NEUROLOGICAL: Unremarkable. PSYCH:  Appropriate.    Appointment on 02/10/2016  Component Date Value Ref Range Status  . WBC 02/10/2016 9.4  3.6 - 11.0 K/uL Final  . RBC 02/10/2016 5.04  3.80 - 5.20 MIL/uL Final  . Hemoglobin 02/10/2016 15.2  12.0 - 16.0 g/dL Final  . HCT 02/10/2016 44.3  35.0 - 47.0 % Final  . MCV 02/10/2016 87.8  80.0 - 100.0 fL Final  . MCH 02/10/2016 30.1  26.0 - 34.0 pg Final  . MCHC 02/10/2016 34.3  32.0 - 36.0 g/dL Final  . RDW 02/10/2016 13.5  11.5 - 14.5 % Final  . Platelets 02/10/2016 227  150 - 440 K/uL Final  . Neutrophils Relative % 02/10/2016 63   Final  . Neutro Abs 02/10/2016 5.9  1.4 - 6.5 K/uL Final  . Lymphocytes Relative 02/10/2016 26   Final  . Lymphs Abs 02/10/2016 2.4  1.0 - 3.6 K/uL Final  . Monocytes Relative 02/10/2016 7   Final  . Monocytes Absolute 02/10/2016 0.6  0.2 - 0.9 K/uL Final  . Eosinophils Relative 02/10/2016 3   Final  . Eosinophils Absolute 02/10/2016 0.3  0 - 0.7 K/uL Final  . Basophils Relative 02/10/2016 1   Final  . Basophils Absolute 02/10/2016 0.1  0 - 0.1 K/uL Final  . Sodium 02/10/2016 138  135 - 145 mmol/L Final  . Potassium 02/10/2016 4.5  3.5 - 5.1 mmol/L Final  . Chloride 02/10/2016 101  101 - 111 mmol/L Final  . CO2 02/10/2016 34* 22 - 32 mmol/L Final  . Glucose, Bld 02/10/2016 109* 65 - 99 mg/dL Final  . BUN 02/10/2016 16  6 - 20 mg/dL Final  . Creatinine, Ser 02/10/2016 0.99  0.44 - 1.00 mg/dL Final  . Calcium 02/10/2016 9.7  8.9 - 10.3 mg/dL Final  . Total Protein 02/10/2016 7.6  6.5 - 8.1 g/dL Final  . Albumin 02/10/2016 4.2  3.5 - 5.0 g/dL Final  . AST 02/10/2016 30  15 - 41 U/L Final  . ALT 02/10/2016 31  14 - 54 U/L Final  . Alkaline Phosphatase 02/10/2016 90  38 - 126 U/L Final  . Total Bilirubin 02/10/2016 0.6  0.3 - 1.2 mg/dL Final  . GFR calc non Af Amer 02/10/2016 >60  >60 mL/min Final  . GFR calc Af Amer 02/10/2016 >60  >60 mL/min Final   Comment: (NOTE) The eGFR has been calculated using the CKD EPI equation. This calculation has not been  validated in all clinical situations. eGFR's persistently <60 mL/min signify possible Chronic Kidney Disease.   . Anion gap 02/10/2016 3* 5 - 15 Final    Assessment:  Tina Delgado is a 60 y.o. female with a history of stage IIB right breast s/p lumpectomy on 05/2013.  Pathology revealed a grade III 1.3 cm lesion with 2 of 16 lymph nodes positive.  Pathologic stage was T1cN1M0 lesion.  Tumor was ER+/PR+ and Her2/neu- .  She enrolled on NSABP B47 clinical trial.  She received 4 cycles of TC (shortened course due to adverse events) from 07/28/2013 to 10/27/2013.  She received breast radiation from 12/18/2013 to 02/06/2014.  In 2015, she took Femara (letrozole) x 1 month.  She was switched to Arimidex secondary to dysgeusia (metallic taste).  She is tolerating Arimidex well.  Mammogram on 07/03/2014 revealed evolution of benign appearing calcifications in the right lower inner quadrant consistent fat necrosis.  Bilateral mammogram on 07/05/2015 revealed no evidence for malignancy.  CA27.29 was 12.4 (0-38.6) on 05/09/2015 and 10.6 on 10/07/2015.  Bone density study on 11/15/2014 revealed osteopenia (T-score -1.2 in right femur).  She is on calcium and vitamin D.  Clinically, she is doing well.  She has issues with arthritis and left rotator cuff.  Exam is stable.  Plan: 1.  Labs today:  CBC with diff, CMP, CA27.29. 2.  Continue Arimidex 1 mg a day. 3.  Anticipate next mammogram on 07/04/2016 (ordered by Dr. Bary Castilla). 4.  RTC in 6 months for MD assessment and labs (CBC with diff, CMP, CA27.29).   Lequita Asal, MD  02/10/2016, 10:43 AM

## 2016-02-11 LAB — CANCER ANTIGEN 27.29: CA 27.29: 18.1 U/mL (ref 0.0–38.6)

## 2016-02-13 ENCOUNTER — Ambulatory Visit
Admission: RE | Admit: 2016-02-13 | Discharge: 2016-02-13 | Disposition: A | Discharge status: Home / Self Care | Payer: BLUE CROSS/BLUE SHIELD | Source: Ambulatory Visit | Attending: Radiation Oncology | Admitting: Radiation Oncology

## 2016-02-13 ENCOUNTER — Encounter: Payer: Self-pay | Admitting: Radiation Oncology

## 2016-02-13 VITALS — BP 177/96 | HR 71 | Temp 98.0°F | Wt 292.8 lb

## 2016-02-13 DIAGNOSIS — C50311 Malignant neoplasm of lower-inner quadrant of right female breast: Secondary | ICD-10-CM

## 2016-02-13 NOTE — Progress Notes (Signed)
Radiation Oncology Follow up Note  Name: Tina Delgado   Date:   02/13/2016 MRN:  [id number] DOB: 08-May-1956    This 60 y.o. female presents to the clinic today for a 2 year follow-up for stage IIa invasive mammary carcinoma of the right breast.  REFERRING PROVIDER: Tonia Ghent, MD  HPI: Patient is a 60 year old female now out 2 years having completed radiation therapy to her right breast for a stage IIa (T1 cN1 a M0) invasive mammary carcinoma status post wide local excision and axillary node dissection. She had adjuvant chemotherapy tumor was ER/PR positive HER-2/neu not overexpressed.. She is currently on aromatase X tolerating that well without side effect. Mammograms have been fine BI-RADS 2 last one done in September 2016 she scheduled for follow-up next September. She specifically denies breast tenderness cough or bone pain. Has been having some issues with rotator cuff tear in her left shoulder that is being followed and treated by orthopedic surgery.  COMPLICATIONS OF TREATMENT: none  FOLLOW UP COMPLIANCE: keeps appointments   PHYSICAL EXAM:  BP 177/96 mmHg  Pulse 71  Temp(Src) 98 F (36.7 C)  Wt 292 lb 12.3 oz (132.8 kg) Lungs are clear to A&P cardiac examination essentially unremarkable with regular rate and rhythm. No dominant mass or nodularity is noted in either breast in 2 positions examined. Incision is well-healed. No axillary or supraclavicular adenopathy is appreciated. Cosmetic result is excellent. Well-developed well-nourished patient in NAD. HEENT reveals PERLA, EOMI, discs not visualized.  Oral cavity is clear. No oral mucosal lesions are identified. Neck is clear without evidence of cervical or supraclavicular adenopathy. Lungs are clear to A&P. Cardiac examination is essentially unremarkable with regular rate and rhythm without murmur rub or thrill. Abdomen is benign with no organomegaly or masses noted. Motor sensory and DTR levels are equal and symmetric  in the upper and lower extremities. Cranial nerves II through XII are grossly intact. Proprioception is intact. No peripheral adenopathy or edema is identified. No motor or sensory levels are noted. Crude visual fields are within normal range.  RADIOLOGY RESULTS: Mammograms reviewed  PLAN: Present time patient is doing well with no evidence of disease. I'm please were overall progress. She continues on aromatase X without side effect. I have asked to see her back in 1 year for follow-up. Follow-up mammograms already been scheduled. Patient knows to call with any concerns.  I would like to take this opportunity for allowing me to participate in the care of your patient.Armstead Peaks., MD

## 2016-04-23 ENCOUNTER — Encounter: Payer: Self-pay | Admitting: *Deleted

## 2016-05-11 ENCOUNTER — Other Ambulatory Visit: Payer: Self-pay | Admitting: Hematology and Oncology

## 2016-05-12 ENCOUNTER — Encounter: Payer: Self-pay | Admitting: Family Medicine

## 2016-05-12 LAB — GLUCOSE (CC13)
A1c: 6.2
ALT: 23
AST: 21 U/L
Cholesterol, Total: 161
Creatinine, Ser: 0.87
Glucose: 108
HDL Cholesterol: 42
Hemoglobin: 14.4
LDH: 254
LDL Calculated: 95 mg/dL
TSH: 4.85
Triglycerides: 119

## 2016-05-19 ENCOUNTER — Other Ambulatory Visit: Payer: Self-pay | Admitting: General Surgery

## 2016-05-19 DIAGNOSIS — C50012 Malignant neoplasm of nipple and areola, left female breast: Secondary | ICD-10-CM

## 2016-06-02 ENCOUNTER — Encounter: Payer: Self-pay | Admitting: *Deleted

## 2016-06-08 ENCOUNTER — Ambulatory Visit (INDEPENDENT_AMBULATORY_CARE_PROVIDER_SITE_OTHER): Payer: BLUE CROSS/BLUE SHIELD | Admitting: Family Medicine

## 2016-06-08 ENCOUNTER — Encounter: Payer: Self-pay | Admitting: Family Medicine

## 2016-06-08 DIAGNOSIS — L03113 Cellulitis of right upper limb: Secondary | ICD-10-CM | POA: Diagnosis not present

## 2016-06-08 MED ORDER — CEPHALEXIN 500 MG PO CAPS: 500.0000 mg | ORAL_CAPSULE | Freq: Four times a day (QID) | ORAL | 0 refills | Status: DC

## 2016-06-08 NOTE — Progress Notes (Signed)
Rash R arm.  Started about 3 days ago. Initially wasn't as red and bright, but more so now.  Mildly itchy.  Not painful.  No clear trigger.  No L arm rash.  No feels well o/w.  No FCNAVD.    Meds, vitals, and allergies reviewed.   ROS: Per HPI unless specifically indicated in ROS section   nad ncat rrr Blanching rash on R arm, not dermatomal.  Coalescing patch on the L deltoid area, small similar lesions on the lower arm, some on the flexor side.  Hand wnl, normal ROM and normal radial pulse.

## 2016-06-08 NOTE — Addendum Note (Signed)
Addended by: Josetta Huddle on: 06/08/2016 11:31 AM   Modules accepted: Orders

## 2016-06-08 NOTE — Patient Instructions (Signed)
Start the antibiotics today and update me if not better.   If spreading redness or fever, then notify us.  Take care.  Glad to see you.

## 2016-06-08 NOTE — Progress Notes (Signed)
Pre visit review using our clinic review tool, if applicable. No additional management support is needed unless otherwise documented below in the visit note. 

## 2016-06-08 NOTE — Assessment & Plan Note (Signed)
Doesn't look like shingles.  Not painful, minimal itching, not dermatomal.  I think she likely had local irritation with mild superinfection.  D/w pt.  She'll monitor the border, update me as needed.  Start kelfex.  See AVS.  She agrees.  Okay for outpatient f/u.

## 2016-06-16 ENCOUNTER — Encounter: Payer: Self-pay | Admitting: Family Medicine

## 2016-06-16 LAB — GLUCOSE (CC13)
A1c: 6.2
ALT: 23
AST: 21 U/L
Cholesterol, Total: 161
Creatinine, Ser: 0.87
Glucose: 108
HDL Cholesterol: 42
Hemoglobin: 14.4
LDL Calculated: 95 mg/dL
TSH: 4.85
Triglycerides: 119
Uric Acid, Serum: 4.7

## 2016-07-01 ENCOUNTER — Other Ambulatory Visit: Payer: Self-pay | Admitting: *Deleted

## 2016-07-01 MED ORDER — SIMVASTATIN 40 MG PO TABS: 40.0000 mg | ORAL_TABLET | Freq: Every day | ORAL | 3 refills | Status: DC

## 2016-07-01 NOTE — Telephone Encounter (Signed)
Last office visit 06/08/2016.  Ok to refill?

## 2016-07-02 ENCOUNTER — Other Ambulatory Visit: Payer: Self-pay | Admitting: *Deleted

## 2016-07-02 NOTE — Telephone Encounter (Signed)
I routed this to oncology, rxing MD.  App onc help.

## 2016-07-02 NOTE — Telephone Encounter (Signed)
Faxed refill request. Last Filled:    90 tablet 0 05/12/2016  At [facility]  This request is for a 90 day supply to [facility], [address].  Please advise.

## 2016-07-03 NOTE — Telephone Encounter (Signed)
Prev routed to onc for them to handle.  Thanks.

## 2016-07-04 NOTE — Telephone Encounter (Signed)
  OK from oncology perspective.  M

## 2016-07-06 ENCOUNTER — Ambulatory Visit
Admission: RE | Admit: 2016-07-06 | Discharge: 2016-07-06 | Disposition: A | Discharge status: Home / Self Care | Payer: BLUE CROSS/BLUE SHIELD | Source: Ambulatory Visit | Attending: General Surgery | Admitting: General Surgery

## 2016-07-06 ENCOUNTER — Other Ambulatory Visit: Payer: Self-pay | Admitting: General Surgery

## 2016-07-06 DIAGNOSIS — C50012 Malignant neoplasm of nipple and areola, left female breast: Secondary | ICD-10-CM

## 2016-07-06 DIAGNOSIS — R928 Other abnormal and inconclusive findings on diagnostic imaging of breast: Secondary | ICD-10-CM | POA: Diagnosis not present

## 2016-07-06 HISTORY — DX: Personal history of antineoplastic chemotherapy: Z92.21

## 2016-07-06 HISTORY — DX: Personal history of irradiation: Z92.3

## 2016-07-07 ENCOUNTER — Encounter: Payer: Self-pay | Admitting: *Deleted

## 2016-07-09 ENCOUNTER — Other Ambulatory Visit: Payer: Self-pay | Admitting: *Deleted

## 2016-07-09 MED ORDER — ANASTROZOLE 1 MG PO TABS
1.0000 mg | ORAL_TABLET | Freq: Every day | ORAL | 0 refills | Status: DC
Start: 2016-07-09 — End: 2016-08-12

## 2016-07-13 ENCOUNTER — Encounter: Payer: Self-pay | Admitting: General Surgery

## 2016-07-13 ENCOUNTER — Ambulatory Visit (INDEPENDENT_AMBULATORY_CARE_PROVIDER_SITE_OTHER): Payer: BLUE CROSS/BLUE SHIELD | Admitting: General Surgery

## 2016-07-13 VITALS — BP 132/78 | HR 66 | Resp 14 | Ht 65.0 in | Wt 300.0 lb

## 2016-07-13 DIAGNOSIS — C50311 Malignant neoplasm of lower-inner quadrant of right female breast: Secondary | ICD-10-CM | POA: Diagnosis not present

## 2016-07-13 NOTE — Patient Instructions (Signed)
Continue self breast exams. Call office for any new breast issues or concerns. 

## 2016-07-13 NOTE — Progress Notes (Signed)
Patient ID: Tina Delgado, female   DOB: 1956-05-03, 60 y.o.   MRN: [id number]  Chief Complaint  Patient presents with  . Follow-up    mammogram    HPI Tina Delgado is a 60 y.o. female who presents for a breast evaluation. The most recent mammogram was done on 07/06/16.  Patient does perform regular self breast checks and gets regular mammograms done.She reports no new breast issues. In August, patient had a skin infection in her upper right arm, she was treated with Keflex which has resolved. Patient has as torn rotator cuff, has not had the surgery yet. She did receive a Cortizone shot in May 2017, which has helped.   The patient has not been making use of her lymphedema sleeve during the day if she finds it very difficult to place herself. She is using and nighttime compressive garment.  HPI  Past Medical History:  Diagnosis Date  . Allergy   . Arthritis    s/p knee injection per ortho  . Breast cancer ([place]) 2014   RT LUMPECTOMY  . Elevated glucose 05/2003   106  . Hyperlipidemia   . Hypertension   . Malignant neoplasm of upper-outer quadrant of female breast [facility]) July 20, 2013   histologic grade 3 invasive mammary carcinoma, 13 mm,  2/16 nodes positive, ER-positive, PR positive, HER-2/neu not amplified.T1c,N1a  . Mammographic microcalcification   . Morbid obesity ([place])   . Personal history of chemotherapy 2014   BREAST CA  . Personal history of radiation therapy 2014   BREAST CA    Past Surgical History:  Procedure Laterality Date  . BREAST EXCISIONAL BIOPSY Right 2014   positive  . BREAST SURGERY Right 07-20-13   Wide excision, SLN biopsy, axillary dissection.  . COLONOSCOPY  [id number]  . JOINT REPLACEMENT  07/30/2007   LTKR Dr Derrel Nip  . KNEE ARTHROSCOPY  03/04/2007   left Dr Derrel Nip  . PORTACATH PLACEMENT  2014  . s/p chemotherapy Right    right breast cancer   . s/p radiation therapy Right    right breast cancer  . TUBAL LIGATION  ~1986   BTL     Family History  Problem Relation Age of Onset  . Heart disease Mother     MI  . Depression Mother     paranoia  . Hypertension Mother   . Stroke Mother   . Parkinsonism Mother   . Heart disease Brother     MI PTCA at 18  . Heart disease Father   . Stomach cancer Maternal Grandfather 27  . Breast cancer Maternal Aunt   . Breast cancer Cousin   . Colon cancer Neg Hx     Social History Social History  Substance Use Topics  . Smoking status: Never Smoker  . Smokeless tobacco: Never Used  . Alcohol use No     Comment: rare    Allergies  Allergen Reactions  . Lisinopril Cough  . Sulfonamide Derivatives Hives    REACTION: itching years ago  . Latex Rash    tape    Current Outpatient Prescriptions  Medication Sig Dispense Refill  . anastrozole (ARIMIDEX) 1 MG tablet Take 1 tablet (1 mg total) by mouth daily. 90 tablet 0  . BLACK COHOSH EXTRACT PO Take 1 tablet by mouth daily.    . Calcium Carbonate-Vitamin D (CALCIUM 500 + D) 500-125 MG-UNIT TABS Take 1 tablet by mouth 2 (two) times daily. Reported on 02/10/2016    . loratadine (CLARITIN) 10  MG tablet Take by mouth.    . metoprolol (LOPRESSOR) 100 MG tablet Take 1 tablet (100 mg total) by mouth 2 (two) times daily. 180 tablet 3  . naproxen sodium (ANAPROX) 220 MG tablet Take 220 mg by mouth 2 (two) times daily with a meal.    . omeprazole (PRILOSEC) 20 MG capsule Take 20 mg by mouth daily.    . simvastatin (ZOCOR) 40 MG tablet Take 1 tablet (40 mg total) by mouth at bedtime. 90 tablet 3   No current facility-administered medications for this visit.     Review of Systems Review of Systems  Constitutional: Negative.   Respiratory: Negative.   Cardiovascular: Negative.     Blood pressure 132/78, pulse 66, resp. rate 14, height 5' 5"  (1.651 m), weight 300 lb (136.1 kg).  Physical Exam Physical Exam  Constitutional: She is oriented to person, place, and time. She appears well-developed and well-nourished.  Eyes:  Conjunctivae are normal. No scleral icterus.  Neck: Neck supple. No thyromegaly present.  Cardiovascular: Normal rate, regular rhythm and normal heart sounds.   Pulmonary/Chest: Effort normal and breath sounds normal. Right breast exhibits no inverted nipple, no mass, no nipple discharge, no skin change and no tenderness. Left breast exhibits no inverted nipple, no mass, no nipple discharge, no skin change and no tenderness.    Mild thickening at scar at 3 o'clock-Right breast  Musculoskeletal:       Arms: Lymphadenopathy:    She has no cervical adenopathy.    She has no axillary adenopathy.  Neurological: She is alert and oriented to person, place, and time.  Skin: Skin is warm and dry.    Data Reviewed Bilateral mammograms dated 07/06/2016 were reviewed. Postsurgical changes. BI-RADS-2.  Assessment    Stable rest exam.  Right forearm lymphedema.    Plan    We'll see if the patient can obtain a hand/forearm sleeve that she can apply herself which will hopefully improve compliance.     Patient to have a bilateral diagnostic mammogram follow up in 1 year.   Prescription written for gauntlet sleeve for right arm.    This information has been scribed by Verlene Mayer, CMA    Robert Bellow 07/13/2016, 10:06 PM

## 2016-07-20 DIAGNOSIS — H524 Presbyopia: Secondary | ICD-10-CM | POA: Diagnosis not present

## 2016-07-20 DIAGNOSIS — H5203 Hypermetropia, bilateral: Secondary | ICD-10-CM | POA: Diagnosis not present

## 2016-07-20 DIAGNOSIS — H25033 Anterior subcapsular polar age-related cataract, bilateral: Secondary | ICD-10-CM | POA: Diagnosis not present

## 2016-07-20 DIAGNOSIS — H52223 Regular astigmatism, bilateral: Secondary | ICD-10-CM | POA: Diagnosis not present

## 2016-08-06 ENCOUNTER — Encounter: Payer: Self-pay | Admitting: Family Medicine

## 2016-08-06 ENCOUNTER — Ambulatory Visit (INDEPENDENT_AMBULATORY_CARE_PROVIDER_SITE_OTHER): Payer: BLUE CROSS/BLUE SHIELD | Admitting: Family Medicine

## 2016-08-06 VITALS — BP 146/80 | HR 64 | Temp 98.6°F | Ht 64.5 in | Wt 301.0 lb

## 2016-08-06 DIAGNOSIS — Z0000 Encounter for general adult medical examination without abnormal findings: Secondary | ICD-10-CM | POA: Diagnosis not present

## 2016-08-06 DIAGNOSIS — M179 Osteoarthritis of knee, unspecified: Secondary | ICD-10-CM

## 2016-08-06 DIAGNOSIS — M25512 Pain in left shoulder: Secondary | ICD-10-CM

## 2016-08-06 DIAGNOSIS — E785 Hyperlipidemia, unspecified: Secondary | ICD-10-CM

## 2016-08-06 DIAGNOSIS — R110 Nausea: Secondary | ICD-10-CM | POA: Insufficient documentation

## 2016-08-06 DIAGNOSIS — M1710 Unilateral primary osteoarthritis, unspecified knee: Secondary | ICD-10-CM

## 2016-08-06 DIAGNOSIS — I10 Essential (primary) hypertension: Secondary | ICD-10-CM

## 2016-08-06 MED ORDER — METOPROLOL TARTRATE 100 MG PO TABS: 100.0000 mg | ORAL_TABLET | Freq: Two times a day (BID) | ORAL | 3 refills | Status: DC

## 2016-08-06 NOTE — Patient Instructions (Addendum)
Check with your insurance to see if they will cover the shingles shot.  Ask oncology about that shot.   Stop the simvastatin for about 2 weeks and see if you have less aches.  Update me as needed.  Get a flu shot at work.   Take care.  Glad to see you.

## 2016-08-06 NOTE — Assessment & Plan Note (Signed)
Tetanus 2013 PNA due at 63.  Shingles shot d/w pt.   Flu shot encouraged. Usually done at work. to be done next month Colonoscopy 2015 Mammogram comes through cancer and surgery clinic.  Pap 2015 DXA not due yet. d/w pt.  Diet and exercise d/w pt. "Terrible." Encouraged. She'll wants to get back to the gym, encouraged. Her knee pain is the main issue here. Living will d/w pt. Would have daughter Estill Bamberg designated if she were incapacitated. HCV and HIV screening d/w pt. She had given blood at the [facility] cross, would have screening done then, ~1990s.  Prev labs d/w pt.  Mild inc in sugar notd.  D/w pt.

## 2016-08-06 NOTE — Progress Notes (Signed)
Pre visit review using our clinic review tool, if applicable. No additional management support is needed unless otherwise documented below in the visit note. 

## 2016-08-06 NOTE — Assessment & Plan Note (Signed)
Per ortho.  

## 2016-08-06 NOTE — Progress Notes (Signed)
CPE- See plan.  Routine anticipatory guidance given to patient.  See health maintenance. Tetanus 2013 PNA due at 63.  Shingles shot d/w pt.   Flu shot encouraged. Usually done at work. to be done next month Colonoscopy 2015 Mammogram comes through cancer and surgery clinic.  Pap 2015 DXA not due yet. d/w pt.  Diet and exercise d/w pt. "Terrible." Encouraged. She'll wants to get back to the gym, encouraged. Her knee pain is the main issue here. Living will d/w pt. Would have daughter Estill Bamberg designated if she were incapacitated. HCV and HIV screening d/w pt. She had given blood at the red cross, would have screening done then, ~1990s.  Prev labs d/w pt.  Mild inc in sugar notd.  D/w pt.   Elevated Cholesterol: Using medications without problems:yes Muscle aches: unclear how much is from statin, d/w pt. Diet compliance:encouraged Exercise:encouraged  Hypertension:    Using medication without problems or lightheadedness: yes Chest pain with exertion:no Edema:some occ BLE Short of breath: likely from deconditioning, she agrees.   D/w pt about gradual weight loss  [place] recently.  Shower was wet and she slipped.  L arm hip the side of the tub.  No LOC.  Sore after the fall.  Was able to bear weight, was able to get herself up.  Bruising on L arm noted.  Prev with some nausea, episodically, only the AMs.  No typical concussion sx, no HA, no vision changes, etc.  No head injury.  He isn't eating breakfast and that my contribute, d/w pt.    She has continued knee pain.  Prev with L shoulder pain, with prev imaging done, seen at [facility].  Shoulder with torn rotator cuff on prev outside MRI, prev injected, with some relief.  She has ortho f/u pending.    PMH and SH reviewed  Meds, vitals, and allergies reviewed.   ROS: Per HPI.  Unless specifically indicated otherwise in HPI, the patient denies:  General: fever. Eyes: acute vision changes ENT: sore throat Cardiovascular:  chest pain Respiratory: SOB GI: vomiting GU: dysuria Musculoskeletal: acute back pain Derm: acute rash Neuro: acute motor dysfunction Psych: worsening mood Endocrine: polydipsia Heme: bleeding Allergy: hayfever  GEN: nad, alert and oriented HEENT: mucous membranes moist NECK: supple w/o LA CV: rrr. PULM: ctab, no inc wob ABD: soft, +bs EXT: no edema SKIN: no acute rash but bruise near L triceps.

## 2016-08-06 NOTE — Assessment & Plan Note (Signed)
Discussed with patient about diet and exercise as tolerated. Still needs weight loss. Continue current medications. Her blood pressure is reasonable today given her level of pain.

## 2016-08-06 NOTE — Assessment & Plan Note (Signed)
She has clear orthopedic reasons for aches. Unclear how much of her aches are also related to statin use. Okay to stop for about 2 weeks and then update me as needed. Labs discussed with patient. Lipids are reasonable while on statin.

## 2016-08-06 NOTE — Assessment & Plan Note (Signed)
She doesn't have typical concussion symptoms. She did not hit her head. It may be that the nausea in the morning is related to not eating breakfast. Discussed with patient. She'll start eating breakfast, at least getting a snack in the morning, and then update me if her symptoms continue. She agrees.

## 2016-08-10 ENCOUNTER — Other Ambulatory Visit: Payer: BLUE CROSS/BLUE SHIELD

## 2016-08-10 ENCOUNTER — Ambulatory Visit: Payer: BLUE CROSS/BLUE SHIELD | Admitting: Hematology and Oncology

## 2016-08-12 ENCOUNTER — Other Ambulatory Visit: Payer: Self-pay | Admitting: *Deleted

## 2016-08-12 ENCOUNTER — Inpatient Hospital Stay: Payer: BLUE CROSS/BLUE SHIELD | Attending: Hematology and Oncology | Admitting: Hematology and Oncology

## 2016-08-12 ENCOUNTER — Inpatient Hospital Stay: Payer: BLUE CROSS/BLUE SHIELD

## 2016-08-12 VITALS — BP 142/93 | HR 114 | Temp 98.0°F | Ht 64.75 in | Wt 300.8 lb

## 2016-08-12 DIAGNOSIS — I10 Essential (primary) hypertension: Secondary | ICD-10-CM | POA: Insufficient documentation

## 2016-08-12 DIAGNOSIS — C50411 Malignant neoplasm of upper-outer quadrant of right female breast: Secondary | ICD-10-CM | POA: Insufficient documentation

## 2016-08-12 DIAGNOSIS — M19012 Primary osteoarthritis, left shoulder: Secondary | ICD-10-CM | POA: Diagnosis not present

## 2016-08-12 DIAGNOSIS — Z803 Family history of malignant neoplasm of breast: Secondary | ICD-10-CM | POA: Diagnosis not present

## 2016-08-12 DIAGNOSIS — Z853 Personal history of malignant neoplasm of breast: Secondary | ICD-10-CM

## 2016-08-12 DIAGNOSIS — M8580 Other specified disorders of bone density and structure, unspecified site: Secondary | ICD-10-CM | POA: Diagnosis not present

## 2016-08-12 DIAGNOSIS — Z170 Estrogen receptor positive status [ER+]: Secondary | ICD-10-CM | POA: Insufficient documentation

## 2016-08-12 DIAGNOSIS — C50311 Malignant neoplasm of lower-inner quadrant of right female breast: Secondary | ICD-10-CM

## 2016-08-12 DIAGNOSIS — Z923 Personal history of irradiation: Secondary | ICD-10-CM | POA: Insufficient documentation

## 2016-08-12 DIAGNOSIS — Z79899 Other long term (current) drug therapy: Secondary | ICD-10-CM | POA: Diagnosis not present

## 2016-08-12 DIAGNOSIS — M1990 Unspecified osteoarthritis, unspecified site: Secondary | ICD-10-CM | POA: Insufficient documentation

## 2016-08-12 DIAGNOSIS — Z79811 Long term (current) use of aromatase inhibitors: Secondary | ICD-10-CM | POA: Diagnosis not present

## 2016-08-12 DIAGNOSIS — M75122 Complete rotator cuff tear or rupture of left shoulder, not specified as traumatic: Secondary | ICD-10-CM | POA: Diagnosis not present

## 2016-08-12 DIAGNOSIS — E785 Hyperlipidemia, unspecified: Secondary | ICD-10-CM | POA: Diagnosis not present

## 2016-08-12 DIAGNOSIS — Z9221 Personal history of antineoplastic chemotherapy: Secondary | ICD-10-CM | POA: Diagnosis not present

## 2016-08-12 LAB — COMPREHENSIVE METABOLIC PANEL
ALT: 31 U/L (ref 14–54)
AST: 30 U/L (ref 15–41)
Albumin: 4.2 g/dL (ref 3.5–5.0)
Alkaline Phosphatase: 79 U/L (ref 38–126)
Anion gap: 8 (ref 5–15)
BUN: 24 mg/dL — ABNORMAL HIGH (ref 6–20)
CO2: 30 mmol/L (ref 22–32)
Calcium: 9.6 mg/dL (ref 8.9–10.3)
Chloride: 102 mmol/L (ref 101–111)
Creatinine, Ser: 1.14 mg/dL — ABNORMAL HIGH (ref 0.44–1.00)
GFR calc Af Amer: 58 mL/min — ABNORMAL LOW (ref >60)
GFR calc non Af Amer: 50 mL/min — ABNORMAL LOW (ref >60)
Glucose, Bld: 127 mg/dL — ABNORMAL HIGH (ref 65–99)
Potassium: 4.6 mmol/L (ref 3.5–5.1)
Sodium: 140 mmol/L (ref 135–145)
Total Bilirubin: 0.7 mg/dL (ref 0.3–1.2)
Total Protein: 7.3 g/dL (ref 6.5–8.1)

## 2016-08-12 LAB — CBC WITH DIFFERENTIAL/PLATELET
Basophils Absolute: 0.1 10*3/uL (ref 0–0.1)
Basophils Relative: 1 %
Eosinophils Absolute: 0.3 10*3/uL (ref 0–0.7)
Eosinophils Relative: 2 %
HCT: 44.3 % (ref 35.0–47.0)
Hemoglobin: 14.9 g/dL (ref 12.0–16.0)
Lymphocytes Relative: 16 %
Lymphs Abs: 2 10*3/uL (ref 1.0–3.6)
MCH: 29.4 pg (ref 26.0–34.0)
MCHC: 33.6 g/dL (ref 32.0–36.0)
MCV: 87.4 fL (ref 80.0–100.0)
Monocytes Absolute: 0.7 10*3/uL (ref 0.2–0.9)
Monocytes Relative: 6 %
Neutro Abs: 9 10*3/uL — ABNORMAL HIGH (ref 1.4–6.5)
Neutrophils Relative %: 75 %
Platelets: 233 10*3/uL (ref 150–440)
RBC: 5.07 MIL/uL (ref 3.80–5.20)
RDW: 13.5 % (ref 11.5–14.5)
WBC: 12.1 10*3/uL — ABNORMAL HIGH (ref 3.6–11.0)

## 2016-08-12 NOTE — Progress Notes (Signed)
Patient here for barireast cancer follow up. Need refill for asastrozole.

## 2016-08-12 NOTE — Progress Notes (Signed)
[facility] day:  08/12/16  Chief Complaint: Tina Delgado is a 60 y.o. female with a history of stage IIB left breast cancer who is seen for 6 month ssessment.  HPI: The patient was last seen in the medical oncology clinic on 02/10/2016.  At that time, she is doing well.  She had issues with arthritis and left rotator cuff.  Exam was stable.    Bilateral mammogram on 07/06/2016 revealed no evidence of malignancy within either breast.  There were stable postsurgical changes within the right breast.  She states that she has a torn rotator cuff.  She received her second shot of cortisone. She has some right knee issues.  She feels like she overdid it walking into clinic today. She continues her Arimidex.  She sees Dr. Bary Castilla in 2 weeks.   Past Medical History:  Diagnosis Date  . Allergy   . Arthritis    s/p knee injection per ortho  . Breast cancer ([place]) 2014   RT LUMPECTOMY  . Elevated glucose 05/2003   106  . Hyperlipidemia   . Hypertension   . Malignant neoplasm of upper-outer quadrant of female breast [facility]) July 20, 2013   histologic grade 3 invasive mammary carcinoma, 13 mm,  2/16 nodes positive, ER-positive, PR positive, HER-2/neu not amplified.T1c,N1a  . Mammographic microcalcification   . Morbid obesity ([place])   . Personal history of chemotherapy 2014   BREAST CA  . Personal history of radiation therapy 2014   BREAST CA    Past Surgical History:  Procedure Laterality Date  . BREAST EXCISIONAL BIOPSY Right 2014   positive  . BREAST SURGERY Right 07-20-13   Wide excision, SLN biopsy, axillary dissection.  . COLONOSCOPY  [id number]  . JOINT REPLACEMENT  07/30/2007   LTKR Dr Derrel Nip  . KNEE ARTHROSCOPY  03/04/2007   left Dr Derrel Nip  . PORTACATH PLACEMENT  2014  . s/p chemotherapy Right    right breast cancer   . s/p radiation therapy Right    right breast cancer  . TUBAL LIGATION  ~1986   BTL    Family History   Problem Relation Age of Onset  . Heart disease Mother     MI  . Depression Mother     paranoia  . Hypertension Mother   . Stroke Mother   . Parkinsonism Mother   . Heart disease Brother     MI PTCA at 80  . Heart disease Father   . Stomach cancer Maternal Grandfather 19  . Breast cancer Maternal Aunt   . Breast cancer Cousin   . Colon cancer Paternal Uncle     Social History:  reports that she has never smoked. She has never used smokeless tobacco. She reports that she drinks alcohol. She reports that she does not use drugs.  The patient is alone today.  Allergies:  Allergies  Allergen Reactions  . Lisinopril Cough  . Sulfonamide Derivatives Hives    REACTION: itching years ago  . Latex Rash    tape    Current Medications: Current Outpatient Prescriptions  Medication Sig Dispense Refill  . anastrozole (ARIMIDEX) 1 MG tablet Take 1 tablet (1 mg total) by mouth daily. 90 tablet 0  . BLACK COHOSH EXTRACT PO Take 1 tablet by mouth daily.    . Calcium Carbonate-Vitamin D (CALCIUM 500 + D) 500-125 MG-UNIT TABS Take 1 tablet by mouth 2 (two) times daily. Reported on 02/10/2016    . loratadine (  CLARITIN) 10 MG tablet Take by mouth.    . metoprolol (LOPRESSOR) 100 MG tablet Take 1 tablet (100 mg total) by mouth 2 (two) times daily. 180 tablet 3  . naproxen sodium (ANAPROX) 220 MG tablet Take 220 mg by mouth 2 (two) times daily with a meal.    . omeprazole (PRILOSEC) 20 MG capsule Take 20 mg by mouth daily.    . simvastatin (ZOCOR) 40 MG tablet Take 1 tablet (40 mg total) by mouth at bedtime. (Patient not taking: Reported on 08/12/2016) 90 tablet 3   No current facility-administered medications for this visit.     Review of Systems:  GENERAL:  Feels "ok".  No fevers or sweats.  Weight gain of 9 pounds. PERFORMANCE STATUS (ECOG):  1 HEENT:  No visual changes, runny nose, sore throat, mouth sores or tenderness. Lungs: No shortness of breath or cough.  No hemoptysis. Cardiac:  No  chest pain, palpitations, orthopnea, or PND. GI:  No nausea, vomiting, diarrhea, constipation, melena or hematochezia. GU:  No urgency, frequency, dysuria, or hematuria. Musculoskeletal:  Arthritis.  Left rotator cuff tear.  Right knee issues.  No back pain.  No muscle tenderness. Extremities:  No pain or swelling. Skin:  No rashes or skin changes. Neuro:  No headache, numbness or weakness, balance or coordination issues. Endocrine:  No diabetes, thyroid issues, hot flashes or night sweats. Psych:  No mood changes, depression or anxiety. Pain:  No focal pain. Review of systems:  All other systems reviewed and found to be negative.  Physical Exam: Blood pressure (!) 142/93, pulse (!) 114, temperature 98 F (36.7 C), temperature source Tympanic, height 5' 4.75" (1.645 m), weight (!) 300 lb 13.1 oz (136.4 kg). GENERAL:  Well developed, well nourished, heavy set woman sitting comfortably in the exam room in no acute distress. MENTAL STATUS:  Alert and oriented to person, place and time. HEAD:  Short gray hair.  Normocephalic, atraumatic, face symmetric, no Cushingoid features. EYES:  Blue eyes.  Pupils equal round and reactive to light and accomodation.  No conjunctivitis or scleral icterus. ENT:  Oropharynx clear without lesion.  Tongue normal. Mucous membranes moist.  RESPIRATORY:  Clear to auscultation without rales, wheezes or rhonchi. CARDIOVASCULAR:  Regular rate and rhythm without murmur, rub or gallop. BREAST:  Right breast with well healed incision at the 3 o'clock position.  Mild post radiation changes.  Left breast with minimal fibrocystic changes in the lower inner quadrant.  No masses, skin changes or nipple discharge.  ABDOMEN:  Soft, non-tender, with active bowel sounds, and no hepatosplenomegaly.  No masses. SKIN:  No rashes, ulcers or lesions. EXTREMITIES: No edema, no skin discoloration or tenderness.  No palpable cords. LYMPH NODES: No palpable cervical, supraclavicular,  axillary or inguinal adenopathy  NEUROLOGICAL: Unremarkable. PSYCH:  Appropriate.   Appointment on 08/12/2016  Component Date Value Ref Range Status  . WBC 08/12/2016 12.1* 3.6 - 11.0 K/uL Final  . RBC 08/12/2016 5.07  3.80 - 5.20 MIL/uL Final  . Hemoglobin 08/12/2016 14.9  12.0 - 16.0 g/dL Final  . HCT 08/12/2016 44.3  35.0 - 47.0 % Final  . MCV 08/12/2016 87.4  80.0 - 100.0 fL Final  . MCH 08/12/2016 29.4  26.0 - 34.0 pg Final  . MCHC 08/12/2016 33.6  32.0 - 36.0 g/dL Final  . RDW 08/12/2016 13.5  11.5 - 14.5 % Final  . Platelets 08/12/2016 233  150 - 440 K/uL Final  . Neutrophils Relative % 08/12/2016 75  % Final  .  Neutro Abs 08/12/2016 9.0* 1.4 - 6.5 K/uL Final  . Lymphocytes Relative 08/12/2016 16  % Final  . Lymphs Abs 08/12/2016 2.0  1.0 - 3.6 K/uL Final  . Monocytes Relative 08/12/2016 6  % Final  . Monocytes Absolute 08/12/2016 0.7  0.2 - 0.9 K/uL Final  . Eosinophils Relative 08/12/2016 2  % Final  . Eosinophils Absolute 08/12/2016 0.3  0 - 0.7 K/uL Final  . Basophils Relative 08/12/2016 1  % Final  . Basophils Absolute 08/12/2016 0.1  0 - 0.1 K/uL Final  . Sodium 08/12/2016 140  135 - 145 mmol/L Final  . Potassium 08/12/2016 4.6  3.5 - 5.1 mmol/L Final  . Chloride 08/12/2016 102  101 - 111 mmol/L Final  . CO2 08/12/2016 30  22 - 32 mmol/L Final  . Glucose, Bld 08/12/2016 127* 65 - 99 mg/dL Final  . BUN 08/12/2016 24* 6 - 20 mg/dL Final  . Creatinine, Ser 08/12/2016 1.14* 0.44 - 1.00 mg/dL Final  . Calcium 08/12/2016 9.6  8.9 - 10.3 mg/dL Final  . Total Protein 08/12/2016 7.3  6.5 - 8.1 g/dL Final  . Albumin 08/12/2016 4.2  3.5 - 5.0 g/dL Final  . AST 08/12/2016 30  15 - 41 U/L Final  . ALT 08/12/2016 31  14 - 54 U/L Final  . Alkaline Phosphatase 08/12/2016 79  38 - 126 U/L Final  . Total Bilirubin 08/12/2016 0.7  0.3 - 1.2 mg/dL Final  . GFR calc non Af Amer 08/12/2016 50* >60 mL/min Final  . GFR calc Af Amer 08/12/2016 58* >60 mL/min Final   Comment: (NOTE) The  eGFR has been calculated using the CKD EPI equation. This calculation has not been validated in all clinical situations. eGFR's persistently <60 mL/min signify possible Chronic Kidney Disease.   . Anion gap 08/12/2016 8  5 - 15 Final    Assessment:  Tina Delgado is a 60 y.o. female with a history of stage IIB right breast cancer s/p lumpectomy on 05/2013.  Pathology revealed a grade III 1.3 cm lesion with 2 of 16 lymph nodes positive.  Pathologic stage was T1cN1M0 lesion.  Tumor was ER+/PR+ and Her2/neu- .  She enrolled on NSABP B47 clinical trial.  She received 4 cycles of TC (shortened course due to adverse events) from 07/28/2013 to 10/27/2013.  She received breast radiation from 12/18/2013 to 02/06/2014.  In 2015, she took Femara (letrozole) x 1 month.  She was switched to Arimidex secondary to dysgeusia (metallic taste).  She is tolerating Arimidex well.  Bilateral mammogram on 07/06/2016 revealed no evidence of malignancy within either breast.  There were stable postsurgical changes within the right breast.  CA27.29 was  14.7 on 02/21/2014, 12.4 on 05/09/2015, 10.6 on 10/07/2015, 18.1 on 02/10/2016, and 15.0 on 08/12/2016.  Bone density study on 11/15/2014 revealed osteopenia with a T-score of -1.2 in right femur.  She is on calcium and vitamin D.  Clinically, she is doing well.  She has issues with her right knee and a left rotator cuff tear.  Exam is stable.  Plan: 1.  Labs today:  CBC with diff, CMP, CA27.29. 2.  Review interval mammogram. 3.  Continue Arimidex 1 mg a day. 4.  Anticipate next mammogram on 07/06/2017 (ordered by Dr. Bary Castilla). 5.  RTC in 6 months for MD assessment and labs (CBC with diff, CMP, CA27.29).    Lequita Asal, MD  08/12/2016, 2:28 PM

## 2016-08-13 LAB — CANCER ANTIGEN 27.29: CA 27.29: 15 U/mL (ref 0.0–38.6)

## 2016-08-15 ENCOUNTER — Encounter: Payer: Self-pay | Admitting: Hematology and Oncology

## 2016-08-15 MED ORDER — ANASTROZOLE 1 MG PO TABS
1.0000 mg | ORAL_TABLET | Freq: Every day | ORAL | 0 refills | Status: DC
Start: 2016-08-15 — End: 2016-12-29

## 2016-08-26 DIAGNOSIS — M25561 Pain in right knee: Secondary | ICD-10-CM | POA: Diagnosis not present

## 2016-11-19 DIAGNOSIS — J019 Acute sinusitis, unspecified: Secondary | ICD-10-CM | POA: Diagnosis not present

## 2016-12-16 DIAGNOSIS — M1711 Unilateral primary osteoarthritis, right knee: Secondary | ICD-10-CM | POA: Diagnosis not present

## 2016-12-16 DIAGNOSIS — M19012 Primary osteoarthritis, left shoulder: Secondary | ICD-10-CM | POA: Diagnosis not present

## 2016-12-23 DIAGNOSIS — M1711 Unilateral primary osteoarthritis, right knee: Secondary | ICD-10-CM | POA: Diagnosis not present

## 2016-12-29 ENCOUNTER — Other Ambulatory Visit: Payer: Self-pay | Admitting: Hematology and Oncology

## 2017-01-13 ENCOUNTER — Encounter
Admission: RE | Admit: 2017-01-13 | Discharge: 2017-01-13 | Disposition: A | Discharge status: Home / Self Care | Payer: BLUE CROSS/BLUE SHIELD | Source: Ambulatory Visit | Attending: Surgery | Admitting: Surgery

## 2017-01-13 ENCOUNTER — Ambulatory Visit
Admission: RE | Admit: 2017-01-13 | Discharge: 2017-01-13 | Disposition: A | Discharge status: Home / Self Care | Payer: BLUE CROSS/BLUE SHIELD | Source: Ambulatory Visit | Attending: Surgery | Admitting: Surgery

## 2017-01-13 DIAGNOSIS — Z01810 Encounter for preprocedural cardiovascular examination: Secondary | ICD-10-CM | POA: Diagnosis not present

## 2017-01-13 DIAGNOSIS — Z01812 Encounter for preprocedural laboratory examination: Secondary | ICD-10-CM | POA: Insufficient documentation

## 2017-01-13 DIAGNOSIS — Z01818 Encounter for other preprocedural examination: Secondary | ICD-10-CM

## 2017-01-13 DIAGNOSIS — I10 Essential (primary) hypertension: Secondary | ICD-10-CM | POA: Diagnosis not present

## 2017-01-13 DIAGNOSIS — R001 Bradycardia, unspecified: Secondary | ICD-10-CM | POA: Insufficient documentation

## 2017-01-13 DIAGNOSIS — M1711 Unilateral primary osteoarthritis, right knee: Secondary | ICD-10-CM | POA: Insufficient documentation

## 2017-01-13 HISTORY — DX: Dyspnea, unspecified: R06.00

## 2017-01-13 LAB — BASIC METABOLIC PANEL
Anion gap: 7 (ref 5–15)
BUN: 14 mg/dL (ref 6–20)
CO2: 34 mmol/L — ABNORMAL HIGH (ref 22–32)
Calcium: 9.8 mg/dL (ref 8.9–10.3)
Chloride: 99 mmol/L — ABNORMAL LOW (ref 101–111)
Creatinine, Ser: 0.96 mg/dL (ref 0.44–1.00)
GFR calc Af Amer: >60 mL/min (ref >60)
GFR calc non Af Amer: >60 mL/min (ref >60)
Glucose, Bld: 93 mg/dL (ref 65–99)
Potassium: 4.1 mmol/L (ref 3.5–5.1)
Sodium: 140 mmol/L (ref 135–145)

## 2017-01-13 LAB — URINALYSIS, COMPLETE (UACMP) WITH MICROSCOPIC
Bacteria, UA: NONE SEEN
Bilirubin Urine: NEGATIVE
Glucose, UA: NEGATIVE mg/dL
Hgb urine dipstick: NEGATIVE
Ketones, ur: NEGATIVE mg/dL
Leukocytes, UA: NEGATIVE
Nitrite: NEGATIVE
Protein, ur: NEGATIVE mg/dL
Specific Gravity, Urine: 1.016 (ref 1.005–1.030)
pH: 7 (ref 5.0–8.0)

## 2017-01-13 LAB — CBC
HCT: 47.9 % — ABNORMAL HIGH (ref 35.0–47.0)
Hemoglobin: 16.3 g/dL — ABNORMAL HIGH (ref 12.0–16.0)
MCH: 29.8 pg (ref 26.0–34.0)
MCHC: 34 g/dL (ref 32.0–36.0)
MCV: 87.7 fL (ref 80.0–100.0)
Platelets: 267 10*3/uL (ref 150–440)
RBC: 5.46 MIL/uL — ABNORMAL HIGH (ref 3.80–5.20)
RDW: 14.1 % (ref 11.5–14.5)
WBC: 10.6 10*3/uL (ref 3.6–11.0)

## 2017-01-13 LAB — TYPE AND SCREEN
ABO/RH(D): A POS
Antibody Screen: NEGATIVE

## 2017-01-13 LAB — PROTIME-INR
INR: 0.99
Prothrombin Time: 13.1 seconds (ref 11.4–15.2)

## 2017-01-13 LAB — APTT: aPTT: 30 seconds (ref 24–36)

## 2017-01-13 LAB — SURGICAL PCR SCREEN
MRSA, PCR: POSITIVE — AB
Staphylococcus aureus: POSITIVE — AB

## 2017-01-13 NOTE — Patient Instructions (Signed)
Your procedure is scheduled on: 01/19/17 Tues Report to Same Day Surgery 2nd floor medical mall [facility] Entrance-take elevator on left to 2nd floor.  Check in with surgery information desk.) To find out your arrival time please call [phone] between 1PM - 3PM on 01/18/17 Mon  Remember: Instructions that are not followed completely may result in serious medical risk, up to and including death, or upon the discretion of your surgeon and anesthesiologist your surgery may need to be rescheduled.    _x___ 1. Do not eat food or drink liquids after midnight. No gum chewing or                              hard candies.     __x__ 2. No Alcohol for 24 hours before or after surgery.   __x__3. No Smoking for 24 prior to surgery.   ____  4. Bring all medications with you on the day of surgery if instructed.    __x__ 5. Notify your doctor if there is any change in your medical condition     (cold, fever, infections).     Do not wear jewelry, make-up, hairpins, clips or nail polish.  Do not wear lotions, powders, or perfumes. You may wear deodorant.  Do not shave 48 hours prior to surgery. Men may shave face and neck.  Do not bring valuables to the hospital.    [facility] is not responsible for any belongings or valuables.               Contacts, dentures or bridgework may not be worn into surgery.  Leave your suitcase in the car. After surgery it may be brought to your room.  For patients admitted to the hospital, discharge time is determined by your                       treatment team.   Patients discharged the day of surgery will not be allowed to drive home.  You will need someone to drive you home and stay with you the night of your procedure.    Please read over the following fact sheets that you were given:   [facility] Preparing for Surgery and or MRSA Information   _x___ Take anti-hypertensive (unless it includes a diuretic), cardiac, seizure, asthma,     anti-reflux and  psychiatric medicines. These include:  1. anastrozole (ARIMIDEX  2.metoprolol (LOPRESSOR  3.omeprazole (PRILOSEC  4.  5.  6.  ____Fleets enema or Magnesium Citrate as directed.   _x___ Use CHG Soap or sage wipes as directed on instruction sheet   ____ Use inhalers on the day of surgery and bring to hospital day of surgery  ____ Stop Metformin and Janumet 2 days prior to surgery.    ____ Take 1/2 of usual insulin dose the night before surgery and none on the morning     surgery.   _x___ Follow recommendations from Cardiologist, Pulmonologist or PCP regarding          stopping Aspirin, Coumadin, Pllavix ,Eliquis, Effient, or Pradaxa, and Pletal.  Stop Naproxen today.  X____Stop Anti-inflammatories such as Advil, Aleve, Ibuprofen, Motrin, Naproxen, Naprosyn, Goodies powders or aspirin products. OK to take Tylenol and                          Celebrex.   _x___ Stop supplements until after surgery.  But may continue Vitamin D, Vitamin B,       and multivitamin.   ____ Bring C-Pap to the hospital.

## 2017-01-19 ENCOUNTER — Inpatient Hospital Stay: Payer: BLUE CROSS/BLUE SHIELD

## 2017-01-19 ENCOUNTER — Inpatient Hospital Stay: Payer: BLUE CROSS/BLUE SHIELD | Admitting: Anesthesiology

## 2017-01-19 ENCOUNTER — Encounter
Admission: RE | Disposition: A | Discharge status: Home Health | Payer: Self-pay | Source: Ambulatory Visit | Attending: Surgery

## 2017-01-19 ENCOUNTER — Encounter: Payer: Self-pay | Admitting: *Deleted

## 2017-01-19 ENCOUNTER — Inpatient Hospital Stay
Admission: RE | Admit: 2017-01-19 | Discharge: 2017-01-21 | DRG: 470 | Disposition: A | Discharge status: Home Health | Payer: BLUE CROSS/BLUE SHIELD | Source: Ambulatory Visit | Attending: Surgery | Admitting: Surgery

## 2017-01-19 DIAGNOSIS — K219 Gastro-esophageal reflux disease without esophagitis: Secondary | ICD-10-CM | POA: Diagnosis not present

## 2017-01-19 DIAGNOSIS — Z96651 Presence of right artificial knee joint: Secondary | ICD-10-CM

## 2017-01-19 DIAGNOSIS — Z91040 Latex allergy status: Secondary | ICD-10-CM | POA: Diagnosis not present

## 2017-01-19 DIAGNOSIS — Z888 Allergy status to other drugs, medicaments and biological substances status: Secondary | ICD-10-CM | POA: Diagnosis not present

## 2017-01-19 DIAGNOSIS — E785 Hyperlipidemia, unspecified: Secondary | ICD-10-CM | POA: Diagnosis present

## 2017-01-19 DIAGNOSIS — Z853 Personal history of malignant neoplasm of breast: Secondary | ICD-10-CM | POA: Diagnosis not present

## 2017-01-19 DIAGNOSIS — Z6841 Body Mass Index (BMI) 40.0 and over, adult: Secondary | ICD-10-CM | POA: Diagnosis not present

## 2017-01-19 DIAGNOSIS — Z882 Allergy status to sulfonamides status: Secondary | ICD-10-CM | POA: Diagnosis not present

## 2017-01-19 DIAGNOSIS — M179 Osteoarthritis of knee, unspecified: Secondary | ICD-10-CM | POA: Diagnosis not present

## 2017-01-19 DIAGNOSIS — M1711 Unilateral primary osteoarthritis, right knee: Secondary | ICD-10-CM | POA: Diagnosis not present

## 2017-01-19 DIAGNOSIS — Z471 Aftercare following joint replacement surgery: Secondary | ICD-10-CM | POA: Diagnosis not present

## 2017-01-19 DIAGNOSIS — Z79899 Other long term (current) drug therapy: Secondary | ICD-10-CM | POA: Diagnosis not present

## 2017-01-19 DIAGNOSIS — I10 Essential (primary) hypertension: Secondary | ICD-10-CM | POA: Diagnosis present

## 2017-01-19 HISTORY — DX: Unspecified rotator cuff tear or rupture of unspecified shoulder, not specified as traumatic: M75.100

## 2017-01-19 HISTORY — PX: TOTAL KNEE ARTHROPLASTY: SHX125

## 2017-01-19 HISTORY — DX: Gastro-esophageal reflux disease without esophagitis: K21.9

## 2017-01-19 LAB — ABO/RH: ABO/RH(D): A POS

## 2017-01-19 SURGERY — ARTHROPLASTY, KNEE, TOTAL
Anesthesia: Spinal | Site: Knee | Laterality: Right | Wound class: Clean

## 2017-01-19 MED ORDER — BISACODYL 10 MG RE SUPP: 10.0000 mg | Freq: Every day | RECTAL | Status: DC | PRN

## 2017-01-19 MED ORDER — NEOMYCIN-POLYMYXIN B GU 40-200000 IR SOLN
Status: AC
  Filled 2017-01-19: qty 20

## 2017-01-19 MED ORDER — FLEET ENEMA 7-19 GM/118ML RE ENEM: 1.0000 | ENEMA | Freq: Once | RECTAL | Status: DC | PRN

## 2017-01-19 MED ORDER — TRANEXAMIC ACID 1000 MG/10ML IV SOLN
INTRAVENOUS | Status: DC | PRN
  Administered 2017-01-19: 1000 mg via INTRAVENOUS

## 2017-01-19 MED ORDER — METOCLOPRAMIDE HCL 5 MG/ML IJ SOLN: 5.0000 mg | Freq: Three times a day (TID) | INTRAMUSCULAR | Status: DC | PRN

## 2017-01-19 MED ORDER — LIDOCAINE HCL (PF) 2 % IJ SOLN
INTRAMUSCULAR | Status: AC
  Filled 2017-01-19: qty 2

## 2017-01-19 MED ORDER — METOPROLOL TARTRATE 50 MG PO TABS
100.0000 mg | ORAL_TABLET | Freq: Two times a day (BID) | ORAL | Status: DC
  Administered 2017-01-19 – 2017-01-21 (×4): 100 mg via ORAL
  Filled 2017-01-19 (×4): qty 2

## 2017-01-19 MED ORDER — HYDROMORPHONE HCL 1 MG/ML IJ SOLN: 1.0000 mg | INTRAMUSCULAR | Status: DC | PRN

## 2017-01-19 MED ORDER — ENOXAPARIN SODIUM 40 MG/0.4ML ~~LOC~~ SOLN
40.0000 mg | Freq: Two times a day (BID) | SUBCUTANEOUS | Status: DC
  Administered 2017-01-20 – 2017-01-21 (×3): 40 mg via SUBCUTANEOUS
  Filled 2017-01-19 (×3): qty 0.4

## 2017-01-19 MED ORDER — PROPOFOL 500 MG/50ML IV EMUL
INTRAVENOUS | Status: AC
Start: 2017-01-19 — End: 2017-01-19
  Filled 2017-01-19: qty 50

## 2017-01-19 MED ORDER — BLACK COHOSH 540 MG PO CAPS: 540.0000 mg | ORAL_CAPSULE | Freq: Every evening | ORAL | Status: DC

## 2017-01-19 MED ORDER — KETOROLAC TROMETHAMINE 15 MG/ML IJ SOLN
15.0000 mg | Freq: Four times a day (QID) | INTRAMUSCULAR | Status: AC
  Administered 2017-01-19 – 2017-01-20 (×4): 15 mg via INTRAVENOUS
  Filled 2017-01-19 (×4): qty 1

## 2017-01-19 MED ORDER — BUPIVACAINE LIPOSOME 1.3 % IJ SUSP
INTRAMUSCULAR | Status: AC
  Filled 2017-01-19: qty 20

## 2017-01-19 MED ORDER — MAGNESIUM HYDROXIDE 400 MG/5ML PO SUSP
30.0000 mL | Freq: Every day | ORAL | Status: DC | PRN
  Administered 2017-01-21: 30 mL via ORAL
  Filled 2017-01-19: qty 30

## 2017-01-19 MED ORDER — BUPIVACAINE HCL (PF) 0.5 % IJ SOLN
INTRAMUSCULAR | Status: AC
  Filled 2017-01-19: qty 10

## 2017-01-19 MED ORDER — PROPOFOL 500 MG/50ML IV EMUL
INTRAVENOUS | Status: AC
  Filled 2017-01-19: qty 50

## 2017-01-19 MED ORDER — FENTANYL CITRATE (PF) 100 MCG/2ML IJ SOLN: 25.0000 ug | INTRAMUSCULAR | Status: DC | PRN

## 2017-01-19 MED ORDER — VANCOMYCIN HCL IN DEXTROSE 1-5 GM/200ML-% IV SOLN
1000.0000 mg | Freq: Once | INTRAVENOUS | Status: AC
  Administered 2017-01-19: 1000 mg via INTRAVENOUS

## 2017-01-19 MED ORDER — PANTOPRAZOLE SODIUM 40 MG PO TBEC
40.0000 mg | DELAYED_RELEASE_TABLET | Freq: Two times a day (BID) | ORAL | Status: DC
  Administered 2017-01-19 – 2017-01-21 (×4): 40 mg via ORAL
  Filled 2017-01-19 (×4): qty 1

## 2017-01-19 MED ORDER — SODIUM CHLORIDE 0.9 % IV SOLN: INTRAVENOUS | Status: DC | PRN

## 2017-01-19 MED ORDER — ONDANSETRON HCL 4 MG/2ML IJ SOLN: 4.0000 mg | Freq: Once | INTRAMUSCULAR | Status: DC | PRN

## 2017-01-19 MED ORDER — FENTANYL CITRATE (PF) 100 MCG/2ML IJ SOLN
INTRAMUSCULAR | Status: AC
  Filled 2017-01-19: qty 2

## 2017-01-19 MED ORDER — KCL IN DEXTROSE-NACL 20-5-0.9 MEQ/L-%-% IV SOLN
INTRAVENOUS | Status: DC
  Administered 2017-01-19: 16:00:00 via INTRAVENOUS
  Administered 2017-01-20: 100 mL/h via INTRAVENOUS
  Filled 2017-01-19 (×6): qty 1000

## 2017-01-19 MED ORDER — SODIUM CHLORIDE 0.9 % IV SOLN
INTRAVENOUS | Status: DC | PRN
  Administered 2017-01-19: 5 ug/kg/min via INTRAVENOUS

## 2017-01-19 MED ORDER — GLYCOPYRROLATE 0.2 MG/ML IJ SOLN
INTRAMUSCULAR | Status: AC
  Filled 2017-01-19: qty 1

## 2017-01-19 MED ORDER — FLUTICASONE PROPIONATE 50 MCG/ACT NA SUSP
1.0000 | Freq: Every day | NASAL | Status: DC | PRN
  Filled 2017-01-19: qty 16

## 2017-01-19 MED ORDER — GLYCOPYRROLATE 0.2 MG/ML IJ SOLN
INTRAMUSCULAR | Status: DC | PRN
  Administered 2017-01-19 (×2): 0.2 mg via INTRAVENOUS

## 2017-01-19 MED ORDER — METOCLOPRAMIDE HCL 10 MG PO TABS: 5.0000 mg | ORAL_TABLET | Freq: Three times a day (TID) | ORAL | Status: DC | PRN

## 2017-01-19 MED ORDER — CALCIUM CARBONATE-VITAMIN D 500-200 MG-UNIT PO TABS
2.0000 | ORAL_TABLET | Freq: Every day | ORAL | Status: DC
  Administered 2017-01-20 – 2017-01-21 (×2): 2 via ORAL
  Filled 2017-01-19 (×2): qty 2

## 2017-01-19 MED ORDER — NEOMYCIN-POLYMYXIN B GU 40-200000 IR SOLN
Status: DC | PRN
  Administered 2017-01-19: 16 mL

## 2017-01-19 MED ORDER — OXYCODONE HCL 5 MG PO TABS
5.0000 mg | ORAL_TABLET | ORAL | Status: DC | PRN
  Administered 2017-01-19 – 2017-01-21 (×4): 10 mg via ORAL
  Filled 2017-01-19 (×4): qty 2

## 2017-01-19 MED ORDER — BUPIVACAINE-EPINEPHRINE (PF) 0.5% -1:200000 IJ SOLN
INTRAMUSCULAR | Status: DC | PRN
  Administered 2017-01-19: 30 mL

## 2017-01-19 MED ORDER — CHLORHEXIDINE GLUCONATE CLOTH 2 % EX PADS
6.0000 | MEDICATED_PAD | Freq: Every day | CUTANEOUS | Status: DC
  Administered 2017-01-20: 6 via TOPICAL

## 2017-01-19 MED ORDER — ONDANSETRON HCL 4 MG PO TABS: 4.0000 mg | ORAL_TABLET | Freq: Four times a day (QID) | ORAL | Status: DC | PRN

## 2017-01-19 MED ORDER — SODIUM CHLORIDE 0.9 % IV SOLN
INTRAVENOUS | Status: DC | PRN
  Administered 2017-01-19: 60 mL

## 2017-01-19 MED ORDER — ANASTROZOLE 1 MG PO TABS
1.0000 mg | ORAL_TABLET | Freq: Every evening | ORAL | Status: DC
  Administered 2017-01-19 – 2017-01-20 (×2): 1 mg via ORAL
  Filled 2017-01-19 (×3): qty 1

## 2017-01-19 MED ORDER — ACETAMINOPHEN 10 MG/ML IV SOLN
INTRAVENOUS | Status: AC
  Filled 2017-01-19: qty 100

## 2017-01-19 MED ORDER — VASOPRESSIN 20 UNIT/ML IV SOLN
INTRAVENOUS | Status: DC | PRN
  Administered 2017-01-19: 3 [IU] via INTRAVENOUS

## 2017-01-19 MED ORDER — FENTANYL CITRATE (PF) 100 MCG/2ML IJ SOLN
INTRAMUSCULAR | Status: DC | PRN
  Administered 2017-01-19: 50 ug via INTRAVENOUS
  Administered 2017-01-19: 25 ug via INTRAVENOUS

## 2017-01-19 MED ORDER — LIDOCAINE HCL (CARDIAC) 20 MG/ML IV SOLN
INTRAVENOUS | Status: DC | PRN
  Administered 2017-01-19: 100 mg via INTRAVENOUS
  Administered 2017-01-19 (×2): 50 mg via INTRAVENOUS

## 2017-01-19 MED ORDER — PROPOFOL 500 MG/50ML IV EMUL
INTRAVENOUS | Status: DC | PRN
  Administered 2017-01-19: 50 ug/kg/min via INTRAVENOUS

## 2017-01-19 MED ORDER — SODIUM CHLORIDE 0.9 % IJ SOLN
INTRAMUSCULAR | Status: AC
  Filled 2017-01-19: qty 50

## 2017-01-19 MED ORDER — ACETAMINOPHEN 325 MG PO TABS: 650.0000 mg | ORAL_TABLET | Freq: Four times a day (QID) | ORAL | Status: DC | PRN

## 2017-01-19 MED ORDER — DEXAMETHASONE SODIUM PHOSPHATE 4 MG/ML IJ SOLN
INTRAMUSCULAR | Status: DC | PRN
  Administered 2017-01-19: 6 mg via INTRAVENOUS

## 2017-01-19 MED ORDER — BUPIVACAINE-EPINEPHRINE (PF) 0.5% -1:200000 IJ SOLN
INTRAMUSCULAR | Status: AC
  Filled 2017-01-19: qty 30

## 2017-01-19 MED ORDER — ONDANSETRON HCL 4 MG/2ML IJ SOLN: 4.0000 mg | Freq: Four times a day (QID) | INTRAMUSCULAR | Status: DC | PRN

## 2017-01-19 MED ORDER — VANCOMYCIN HCL IN DEXTROSE 1-5 GM/200ML-% IV SOLN
1000.0000 mg | Freq: Two times a day (BID) | INTRAVENOUS | Status: AC
  Administered 2017-01-19: 1000 mg via INTRAVENOUS
  Filled 2017-01-19: qty 200

## 2017-01-19 MED ORDER — DOCUSATE SODIUM 100 MG PO CAPS
100.0000 mg | ORAL_CAPSULE | Freq: Two times a day (BID) | ORAL | Status: DC
  Administered 2017-01-19 – 2017-01-21 (×5): 100 mg via ORAL
  Filled 2017-01-19 (×5): qty 1

## 2017-01-19 MED ORDER — VANCOMYCIN HCL IN DEXTROSE 1-5 GM/200ML-% IV SOLN
INTRAVENOUS | Status: AC
  Administered 2017-01-19: 1000 mg via INTRAVENOUS
  Filled 2017-01-19: qty 200

## 2017-01-19 MED ORDER — ACETAMINOPHEN 650 MG RE SUPP: 650.0000 mg | Freq: Four times a day (QID) | RECTAL | Status: DC | PRN

## 2017-01-19 MED ORDER — KETOROLAC TROMETHAMINE 30 MG/ML IJ SOLN
30.0000 mg | Freq: Once | INTRAMUSCULAR | Status: AC
  Administered 2017-01-19: 30 mg via INTRAVENOUS

## 2017-01-19 MED ORDER — KETAMINE HCL 50 MG/ML IJ SOLN
INTRAMUSCULAR | Status: DC | PRN
  Administered 2017-01-19 (×2): 1.3 mg via INTRAMUSCULAR
  Administered 2017-01-19: 30 mg via INTRAMUSCULAR

## 2017-01-19 MED ORDER — KETOROLAC TROMETHAMINE 30 MG/ML IJ SOLN
INTRAMUSCULAR | Status: AC
  Administered 2017-01-19: 30 mg via INTRAVENOUS
  Filled 2017-01-19: qty 1

## 2017-01-19 MED ORDER — ACETAMINOPHEN 500 MG PO TABS
1000.0000 mg | ORAL_TABLET | Freq: Four times a day (QID) | ORAL | Status: AC
  Administered 2017-01-19 – 2017-01-20 (×4): 1000 mg via ORAL
  Filled 2017-01-19 (×4): qty 2

## 2017-01-19 MED ORDER — TRANEXAMIC ACID 1000 MG/10ML IV SOLN
INTRAVENOUS | Status: AC
  Filled 2017-01-19: qty 10

## 2017-01-19 MED ORDER — SODIUM CHLORIDE 0.9 % IV SOLN
INTRAVENOUS | Status: DC | PRN
  Administered 2017-01-19: 30 ug/min via INTRAVENOUS

## 2017-01-19 MED ORDER — PROPOFOL 10 MG/ML IV BOLUS
INTRAVENOUS | Status: DC | PRN
  Administered 2017-01-19 (×2): 13 mg via INTRAVENOUS

## 2017-01-19 MED ORDER — MIDAZOLAM HCL 5 MG/5ML IJ SOLN
INTRAMUSCULAR | Status: DC | PRN
  Administered 2017-01-19 (×2): 1 mg via INTRAVENOUS

## 2017-01-19 MED ORDER — ACETAMINOPHEN 10 MG/ML IV SOLN
INTRAVENOUS | Status: DC | PRN
  Administered 2017-01-19: 1000 mg via INTRAVENOUS

## 2017-01-19 MED ORDER — LACTATED RINGERS IV SOLN
INTRAVENOUS | Status: DC
  Administered 2017-01-19: 13:00:00 via INTRAVENOUS
  Administered 2017-01-19: 20 mL/h via INTRAVENOUS

## 2017-01-19 MED ORDER — MUPIROCIN 2 % EX OINT
1.0000 "application " | TOPICAL_OINTMENT | Freq: Two times a day (BID) | CUTANEOUS | Status: DC
  Administered 2017-01-19 – 2017-01-21 (×4): 1 via NASAL
  Filled 2017-01-19: qty 22

## 2017-01-19 MED ORDER — DIPHENHYDRAMINE HCL 12.5 MG/5ML PO ELIX: 12.5000 mg | ORAL_SOLUTION | ORAL | Status: DC | PRN

## 2017-01-19 MED ORDER — MIDAZOLAM HCL 2 MG/2ML IJ SOLN
INTRAMUSCULAR | Status: AC
  Filled 2017-01-19: qty 2

## 2017-01-19 SURGICAL SUPPLY — 63 items
BANDAGE ELASTIC 6 CLIP ST LF (GAUZE/BANDAGES/DRESSINGS) ×2 IMPLANT
BIT DRILL QUICK REL 1/8 2PK SL (DRILL) IMPLANT
BLADE SAW SAG 25X90X1.19 (BLADE) ×2 IMPLANT
BLADE SURG SZ20 CARB STEEL (BLADE) ×2 IMPLANT
BNDG COHESIVE 4X5 TAN STRL (GAUZE/BANDAGES/DRESSINGS) ×2 IMPLANT
BNDG COHESIVE 6X5 TAN STRL LF (GAUZE/BANDAGES/DRESSINGS) ×2 IMPLANT
BONE CEMENT PALACOSE (Cement) ×4 IMPLANT
CANISTER SUCT 1200ML W/VALVE (MISCELLANEOUS) ×2 IMPLANT
CANISTER SUCT 3000ML (MISCELLANEOUS) ×2 IMPLANT
CAPT KNEE TOTAL 3 ×2 IMPLANT
CATH TRAY METER 16FR LF (MISCELLANEOUS) ×2 IMPLANT
CEMENT BONE PALACOSE (Cement) ×2 IMPLANT
CHLORAPREP W/TINT 26ML (MISCELLANEOUS) ×2 IMPLANT
COOLER POLAR GLACIER W/PUMP (MISCELLANEOUS) ×2 IMPLANT
COVER MAYO STAND STRL (DRAPES) ×2 IMPLANT
CUFF TOURN 24 STER (MISCELLANEOUS) IMPLANT
CUFF TOURN 30 STER DUAL PORT (MISCELLANEOUS) IMPLANT
CUFF TOURN 34 STER (MISCELLANEOUS) ×2 IMPLANT
DECANTER SPIKE VIAL GLASS SM (MISCELLANEOUS) ×6 IMPLANT
DRAPE IMP U-DRAPE 54X76 (DRAPES) ×2 IMPLANT
DRAPE INCISE IOBAN 66X45 STRL (DRAPES) ×4 IMPLANT
DRAPE SHEET LG 3/4 BI-LAMINATE (DRAPES) ×2 IMPLANT
DRILL QUICK RELEASE 1/8 INCH (DRILL)
DRSG OPSITE POSTOP 4X10 (GAUZE/BANDAGES/DRESSINGS) ×2 IMPLANT
DRSG OPSITE POSTOP 4X12 (GAUZE/BANDAGES/DRESSINGS) IMPLANT
DRSG OPSITE POSTOP 4X14 (GAUZE/BANDAGES/DRESSINGS) IMPLANT
ELECT CAUTERY BLADE 6.4 (BLADE) ×2 IMPLANT
ELECT REM PT RETURN 9FT ADLT (ELECTROSURGICAL) ×2
ELECTRODE REM PT RTRN 9FT ADLT (ELECTROSURGICAL) ×1 IMPLANT
GLOVE BIO SURGEON STRL SZ7.5 (GLOVE) ×8 IMPLANT
GLOVE BIO SURGEON STRL SZ8 (GLOVE) ×8 IMPLANT
GLOVE BIOGEL PI IND STRL 8 (GLOVE) ×1 IMPLANT
GLOVE BIOGEL PI INDICATOR 8 (GLOVE) ×1
GLOVE INDICATOR 8.0 STRL GRN (GLOVE) ×2 IMPLANT
GOWN STRL REUS W/ TWL LRG LVL3 (GOWN DISPOSABLE) ×2 IMPLANT
GOWN STRL REUS W/ TWL XL LVL3 (GOWN DISPOSABLE) ×1 IMPLANT
GOWN STRL REUS W/TWL LRG LVL3 (GOWN DISPOSABLE) ×2
GOWN STRL REUS W/TWL XL LVL3 (GOWN DISPOSABLE) ×1
HANDPIECE INTERPULSE COAX TIP (DISPOSABLE) ×1
HOLDER FOLEY CATH W/STRAP (MISCELLANEOUS) ×2 IMPLANT
HOOD PEEL AWAY FLYTE STAYCOOL (MISCELLANEOUS) ×6 IMPLANT
IMMBOLIZER KNEE 19 BLUE UNIV (SOFTGOODS) ×2 IMPLANT
KIT RM TURNOVER STRD PROC AR (KITS) ×2 IMPLANT
NDL SAFETY 18GX1.5 (NEEDLE) ×2 IMPLANT
NEEDLE 18GX1X1/2 (RX/OR ONLY) (NEEDLE) ×2 IMPLANT
NEEDLE SPNL 20GX3.5 QUINCKE YW (NEEDLE) ×2 IMPLANT
NS IRRIG 1000ML POUR BTL (IV SOLUTION) ×2 IMPLANT
PACK TOTAL KNEE (MISCELLANEOUS) ×2 IMPLANT
PAD WRAPON POLAR KNEE (MISCELLANEOUS) ×1 IMPLANT
SET HNDPC FAN SPRY TIP SCT (DISPOSABLE) ×1 IMPLANT
SOL .9 NS 3000ML IRR  AL (IV SOLUTION) ×1
SOL .9 NS 3000ML IRR UROMATIC (IV SOLUTION) ×1 IMPLANT
STAPLER SKIN PROX 35W (STAPLE) ×2 IMPLANT
SUCTION FRAZIER HANDLE 10FR (MISCELLANEOUS) ×1
SUCTION TUBE FRAZIER 10FR DISP (MISCELLANEOUS) ×1 IMPLANT
SUT VIC AB 0 CT1 36 (SUTURE) ×10 IMPLANT
SUT VIC AB 2-0 CT1 27 (SUTURE) ×5
SUT VIC AB 2-0 CT1 TAPERPNT 27 (SUTURE) ×5 IMPLANT
SYR 20CC LL (SYRINGE) ×2 IMPLANT
SYR 30ML LL (SYRINGE) ×4 IMPLANT
SYRINGE 10CC LL (SYRINGE) ×2 IMPLANT
SYSTEM VACUUM CEMENT MIXING ×2 IMPLANT
WRAPON POLAR PAD KNEE (MISCELLANEOUS) ×2

## 2017-01-19 NOTE — Anesthesia Post-op Follow-up Note (Cosign Needed)
Anesthesia QCDR form completed.        

## 2017-01-19 NOTE — Op Note (Signed)
01/19/2017  1:23 PM  Patient:   Tina Delgado  Pre-Op Diagnosis:   Degenerative joint disease, right knee.  Post-Op Diagnosis:   Same  Procedure:   right TKA using all-cemented Bi[employer] Vanguard system with a 67.5 mm mm PCR femur, a 71 mm tibial tray with a 12 mm E-poly insert, and a 31 x 8 mm all-poly 3-pegged domed patella.  Surgeon:   Pascal Lux, MD  Assistant:   Cameron Proud, PA-C   Anesthesia:   Spinal  Findings:   As above  Complications:   None  EBL:   10 cc  Fluids:   1300 cc crystalloid  UOP:   100 cc  TT:   105 minutes at 300 mmHg  Drains:   None  Closure:   Staples  Implants:   As above  Brief Clinical Note:   The patient is a 61 year old female with a long history of progressively worsening right knee pain. The patient's symptoms have progressed despite medications, activity modification, injections, etc. The patient's history and examination were consistent with advanced degenerative joint disease of the right knee confirmed by plain radiographs. The patient presents at this time for a right total knee arthroplasty.  Procedure:   The patient was brought into the operating room. After adequate spinal anesthesia was obtained, the patient was lain in the supine position. A Foley catheter was placed by the nurse before the right lower extremity was prepped with ChloraPrep solution and draped sterilely. Preoperative antibiotics were administered. After verifying the proper laterality with a surgical timeout, the limb was exsanguinated with an Esmarch and the tourniquet inflated to 300 mmHg. A standard anterior approach to the knee was made through an approximately 7 inch incision. The incision was carried down through the subcutaneous tissues to expose superficial retinaculum. This was split the length of the incision and the medial flap elevated sufficiently to expose the medial retinaculum. The medial retinaculum was incised, leaving a 3-4 mm cuff of tissue on  the patella. This was extended distally along the medial border of the patellar tendon and proximally through the medial third of the quadriceps tendon. A subtotal fat pad excision was performed before the soft tissues were elevated off the anteromedial and anterolateral aspects of the proximal tibia to the level of the collateral ligaments. The anterior portions of the medial and lateral menisci were removed, as was the anterior cruciate ligament. With the knee flexed to 90, the external tibial guide was positioned and the appropriate proximal tibial cut made. This piece was taken to the back table where it was measured and found to be optimally replicated by a 71 mm component.  Attention was directed to the distal femur. The intramedullary canal was accessed through a 3/8" drill hole. The intramedullary guide was inserted and position in order to obtain a neutral flexion gap. The intercondylar block was positioned with care taken to avoid notching the anterior cortex of the femur. The appropriate cut was made. Next, the distal cutting block was placed at 6 of valgus alignment. Using the 9 mm slot, the distal cut was made. The distal femur was measured and found to be optimally replicated by the 35.3 mm component. The 67.5 mm 4-in-1 cutting block was positioned and first the posterior, then the posterior chamfer, the anterior chamfer, femoral and intercondylar cuts were made. At this point, the posterior portions medial and lateral menisci were removed. A trial reduction was performed using the appropriate femoral and tibial components with the 10  mm and the 12 mm inserts. The 12 mm insert demonstrated excellent stability to varus and valgus stressing both in flexion and extension while permitting full extension. Patella tracking was assessed and found to be excellent. Therefore, the tibial guide position was marked on the proximal tibia. The patella thickness was measured and found to be 22 mm. Therefore, the  appropriate cut was made. The surfaces were measured and found to be optimally replicated by the 31 mm component. The three peg holes were drilled in place before the trial button was inserted. Patella tracking was assessed and found to be excellent, passing the "no thumb test". The lug holes were drilled into the distal femur before the trial component was removed, leaving only the tibial tray. The keel was then created using the appropriate tower, reamer, and punch.  The bony surfaces were prepared for cementing by irrigating thoroughly with bacitracin saline solution. A bone plug was fashioned from some of the bone that had been removed previously and used to plug the distal femoral canal. In addition, 20 cc of Exparel diluted out to 60 cc with normal saline and 30 cc of 0.5% Sensorcaine were injected into the postero-medial and postero-lateral aspects of the knee, the medial and lateral gutter regions, and the peri-incisional tissues to help with postoperative analgesia. Meanwhile, the cement was being mixed on the back table. When it was ready, the tibial tray was cemented in first. The excess cement was removed using [employer] fast streamer. Next, the femoral component was impacted into place. Again, the excess cement was removed using [employer] fast streamer. The 12 mm trial insert was positioned and the knee brought into extension while the cement hardened. Finally, the patella was cemented into place and secured using the patellar clamp. Again, the excess cement was removed using [employer] fast streamer. Once the cement had hardened, the knee was placed through a range of motion with the findings as described above. Therefore, the trial insert was removed and, after verifying that no cement had been retained posteriorly, the permanent insert was positioned and secured using the appropriate key locking mechanism. Again the knee was placed through a range of motion with the findings as described above.  The wound was copiously  irrigated with bacitracin saline solution using the jet lavage system before the quadriceps tendon and retinacular layer were reapproximated using #0 Vicryl interrupted sutures. The superficial retinacular layer was closed using 2-0 Vicryl interrupted sutures in several layers for the skin was closed using staples. A sterile honeycomb dressing was applied to the skin before the leg was wrapped with an Ace wrap to accommodate the polar pack. The patient was then awakened and returned to the recovery room in satisfactory condition after tolerating the procedure well.

## 2017-01-19 NOTE — Progress Notes (Signed)
PHARMACIST - PHYSICIAN ORDER COMMUNICATION  CONCERNING: P&T Medication Policy on Herbal Medications  DESCRIPTION:  This patient's order for:  Black Cohosh  has been noted.  This product(s) is classified as an "herbal" or natural product. Due to a lack of definitive safety studies or FDA approval, nonstandard manufacturing practices, plus the potential risk of unknown drug-drug interactions while on inpatient medications, the Pharmacy and Therapeutics Committee does not permit the use of "herbal" or natural products of this type within [facility].   ACTION TAKEN: The pharmacy department is unable to verify this order at this time and your patient has been informed of this safety policy. Please reevaluate patient's clinical condition at discharge and address if the herbal or natural product(s) should be resumed at that time.

## 2017-01-19 NOTE — NC FL2 (Signed)
[place] LEVEL OF CARE SCREENING TOOL     IDENTIFICATION  Patient Name: Tina Delgado Birthdate: Jun 29, 1956 Sex: female Admission Date (Current Location): 01/19/2017  [place] and [place] Number:  Engineering geologist and Address:  [facility], [address] [place], [place] [address]      Provider Number: [id number]  Attending Physician Name and Address:  Corky Mull, MD  Relative Name and Phone Number:       Current Level of Care: Hospital Recommended Level of Care: [place] Prior Approval Number:    Date Approved/Denied:   PASRR Number:  [id number])  Discharge Plan: SNF    Current Diagnoses: Patient Active Problem List   Diagnosis Date Noted  . Status post total knee replacement using cement, right 01/19/2017  . Nausea without vomiting 08/06/2016  . Advance care planning 06/23/2015  . Left shoulder pain 06/23/2015  . OA (osteoarthritis) of knee 06/23/2015  . Lymphedema 11/13/2013  . Breast cancer of lower-inner quadrant of right female breast ([place]) 07/25/2013  . Leg cramps 06/10/2012  . Knee pain 06/10/2012  . Routine general medical examination at a health care facility 03/31/2011  . HLD (hyperlipidemia) 08/25/2010  . HEMORRHOIDS, EXTERNAL 02/26/2010  . Hyperglycemia 03/21/2007  . ALLERGY 03/21/2007  . Essential hypertension 10/31/2006    Orientation RESPIRATION BLADDER Height & Weight     Self, Time, Situation, Place  O2 (Nasal Cannula 2L/min) Incontinent, External catheter Weight: 296 lb (134.3 kg) Height:  5\' 5"  (165.1 cm)  BEHAVIORAL SYMPTOMS/MOOD NEUROLOGICAL BOWEL NUTRITION STATUS   (None. )  (None.) Incontinent Diet (Diet: Carb Modified)  AMBULATORY STATUS COMMUNICATION OF NEEDS Skin   Extensive Assist Verbally Surgical wounds (Incision: Right Knee)                       Personal Care Assistance Level of Assistance  Bathing, Feeding, Dressing Bathing Assistance: Limited  assistance Feeding assistance: Independent Dressing Assistance: Limited assistance     Functional Limitations Info  Sight, Hearing, Speech Sight Info: Adequate Hearing Info: Adequate Speech Info: Adequate    SPECIAL CARE FACTORS FREQUENCY  PT (By licensed PT), OT (By licensed OT)     PT Frequency:  (5) OT Frequency:  (5)            Contractures      Additional Factors Info  Code Status, Allergies Code Status Info:  (Full Code) Allergies Info:  (Latex, Lisinopril, Sulfonamide Derivatives)           Current Medications (01/19/2017):  This is the current hospital active medication list Current Facility-Administered Medications  Medication Dose Route Frequency Provider Last Rate Last Dose  . acetaminophen (TYLENOL) tablet 650 mg  650 mg Oral Q6H PRN Corky Mull, MD       Or  . acetaminophen (TYLENOL) suppository 650 mg  650 mg Rectal Q6H PRN Corky Mull, MD      . acetaminophen (TYLENOL) tablet 1,000 mg  1,000 mg Oral Q6H Corky Mull, MD      . anastrozole (ARIMIDEX) tablet 1 mg  1 mg Oral QPM Corky Mull, MD      . bisacodyl (DULCOLAX) suppository 10 mg  10 mg Rectal Daily PRN Corky Mull, MD      . calcium-vitamin D (OSCAL WITH D) 500-200 MG-UNIT per tablet 2 tablet  2 tablet Oral Daily Marshall Cork Poggi, MD      . dextrose 5 % and 0.9 %  NaCl with KCl 20 mEq/L infusion   Intravenous Continuous Corky Mull, MD      . diphenhydrAMINE (BENADRYL) 12.5 MG/5ML elixir 12.5-25 mg  12.5-25 mg Oral Q4H PRN Corky Mull, MD      . docusate sodium (COLACE) capsule 100 mg  100 mg Oral BID Corky Mull, MD      . Derrill Memo ON 01/20/2017] enoxaparin (LOVENOX) injection 40 mg  40 mg Subcutaneous Q12H Corky Mull, MD      . fluticasone (FLONASE) 50 MCG/ACT nasal spray 1 spray  1 spray Each Nare Daily PRN Corky Mull, MD      . HYDROmorphone (DILAUDID) injection 1-2 mg  1-2 mg Intravenous Q2H PRN Corky Mull, MD      . ketorolac (TORADOL) 15 MG/ML injection 15 mg  15 mg Intravenous Q6H  Corky Mull, MD      . magnesium hydroxide (MILK OF MAGNESIA) suspension 30 mL  30 mL Oral Daily PRN Corky Mull, MD      . metoCLOPramide (REGLAN) tablet 5-10 mg  5-10 mg Oral Q8H PRN Corky Mull, MD       Or  . metoCLOPramide (REGLAN) injection 5-10 mg  5-10 mg Intravenous Q8H PRN Corky Mull, MD      . metoprolol (LOPRESSOR) tablet 100 mg  100 mg Oral BID Corky Mull, MD      . ondansetron [facility]) tablet 4 mg  4 mg Oral Q6H PRN Corky Mull, MD       Or  . ondansetron (ZOFRAN) injection 4 mg  4 mg Intravenous Q6H PRN Corky Mull, MD      . oxyCODONE (Oxy IR/ROXICODONE) immediate release tablet 5-10 mg  5-10 mg Oral Q3H PRN Corky Mull, MD      . pantoprazole (PROTONIX) EC tablet 40 mg  40 mg Oral BID Corky Mull, MD      . sodium phosphate (FLEET) 7-19 GM/118ML enema 1 enema  1 enema Rectal Once PRN Corky Mull, MD      . vancomycin (VANCOCIN) IVPB 1000 mg/200 mL premix  1,000 mg Intravenous Q12H Corky Mull, MD         Discharge Medications: Please see discharge summary for a list of discharge medications.  Relevant Imaging Results:  Relevant Lab Results:   Additional Information  (SSN: [id number])  Danie Chandler, Student-Social Work

## 2017-01-19 NOTE — Anesthesia Preprocedure Evaluation (Signed)
Anesthesia Evaluation  Patient identified by MRN, date of birth, ID band Patient awake    Reviewed: Allergy & Precautions, NPO status , Patient's Chart, lab work & pertinent test results  History of Anesthesia Complications Negative for: history of anesthetic complications  Airway Mallampati: III       Dental   Pulmonary neg pulmonary ROS,           Cardiovascular hypertension, Pt. on medications and Pt. on home beta blockers      Neuro/Psych negative neurological ROS     GI/Hepatic Neg liver ROS, GERD  Medicated and Controlled,  Endo/Other  negative endocrine ROS  Renal/GU negative Renal ROS     Musculoskeletal   Abdominal   Peds  Hematology   Anesthesia Other Findings   Reproductive/Obstetrics                             Anesthesia Physical Anesthesia Plan  ASA: II  Anesthesia Plan: Spinal   Post-op Pain Management:    Induction:   Airway Management Planned:   Additional Equipment:   Intra-op Plan:   Post-operative Plan:   Informed Consent: I have reviewed the patients History and Physical, chart, labs and discussed the procedure including the risks, benefits and alternatives for the proposed anesthesia with the patient or authorized representative who has indicated his/her understanding and acceptance.     Plan Discussed with:   Anesthesia Plan Comments:         Anesthesia Quick Evaluation

## 2017-01-19 NOTE — Transfer of Care (Signed)
Immediate Anesthesia Transfer of Care Note  Patient: Tina Delgado  Procedure(s) Performed: Procedure(s): TOTAL KNEE ARTHROPLASTY (Right)  Patient Location: PACU  Anesthesia Type:Spinal  Level of Consciousness: sedated  Airway & Oxygen Therapy: Patient Spontanous Breathing and Patient connected to nasal cannula oxygen  Post-op Assessment: Report given to RN and Post -op Vital signs reviewed and stable  Post vital signs: Reviewed and stable  Last Vitals:  Vitals:   01/19/17 0936 01/19/17 1334  BP: (!) 152/90 (!) 110/97  Pulse: 62 71  Resp: 16 16  Temp: 36.7 C (!) 36.1 C    Last Pain:  Vitals:   01/19/17 0936  TempSrc: Oral  PainSc: 2       Patients Stated Pain Goal: 0 (56/86/16 8372)  Complications: No apparent anesthesia complications

## 2017-01-19 NOTE — Anesthesia Procedure Notes (Signed)
Spinal  Patient location during procedure: OR Start time: 01/19/2017 10:45 AM End time: 01/19/2017 10:57 AM Staffing Performed: anesthesiologist  Preanesthetic Checklist Completed: patient identified, site marked, surgical consent, pre-op evaluation, timeout performed, IV checked, risks and benefits discussed and monitors and equipment checked Spinal Block Patient position: sitting Prep: Betadine Patient monitoring: heart rate, continuous pulse ox, blood pressure and cardiac monitor Approach: midline Location: L4-5 Injection technique: single-shot Needle Needle type: Whitacre and Introducer  Needle gauge: 24 G Needle length: 9 cm Needle insertion depth: 10 cm Additional Notes Negative paresthesia. Negative blood return. Positive free-flowing CSF. Expiration date of kit checked and confirmed. Patient tolerated procedure well, without complications.

## 2017-01-19 NOTE — H&P (Signed)
Paper H&P to be scanned into permanent record. H&P reviewed and patient re-examined. No changes. 

## 2017-01-20 ENCOUNTER — Encounter: Payer: Self-pay | Admitting: Surgery

## 2017-01-20 LAB — CBC WITH DIFFERENTIAL/PLATELET
Basophils Absolute: 0.1 10*3/uL (ref 0–0.1)
Basophils Relative: 0 %
Eosinophils Absolute: 0 10*3/uL (ref 0–0.7)
Eosinophils Relative: 0 %
HCT: 43.3 % (ref 35.0–47.0)
Hemoglobin: 14.8 g/dL (ref 12.0–16.0)
Lymphocytes Relative: 8 %
Lymphs Abs: 1.4 10*3/uL (ref 1.0–3.6)
MCH: 29.9 pg (ref 26.0–34.0)
MCHC: 34 g/dL (ref 32.0–36.0)
MCV: 87.9 fL (ref 80.0–100.0)
Monocytes Absolute: 0.9 10*3/uL (ref 0.2–0.9)
Monocytes Relative: 5 %
Neutro Abs: 14.1 10*3/uL — ABNORMAL HIGH (ref 1.4–6.5)
Neutrophils Relative %: 87 %
Platelets: 218 10*3/uL (ref 150–440)
RBC: 4.93 MIL/uL (ref 3.80–5.20)
RDW: 14 % (ref 11.5–14.5)
WBC: 16.4 10*3/uL — ABNORMAL HIGH (ref 3.6–11.0)

## 2017-01-20 LAB — BASIC METABOLIC PANEL
Anion gap: 6 (ref 5–15)
BUN: 14 mg/dL (ref 6–20)
CO2: 28 mmol/L (ref 22–32)
Calcium: 9 mg/dL (ref 8.9–10.3)
Chloride: 103 mmol/L (ref 101–111)
Creatinine, Ser: 0.89 mg/dL (ref 0.44–1.00)
GFR calc Af Amer: >60 mL/min (ref >60)
GFR calc non Af Amer: >60 mL/min (ref >60)
Glucose, Bld: 153 mg/dL — ABNORMAL HIGH (ref 65–99)
Potassium: 4.2 mmol/L (ref 3.5–5.1)
Sodium: 137 mmol/L (ref 135–145)

## 2017-01-20 NOTE — Progress Notes (Signed)
qPhysical Therapy Treatment Patient Details Name: Tina Delgado MRN: [id number] DOB: [birth year] Today's Date: 01/20/2017    History of Present Illness Pt is a 61 y/o F s/p R TKA.  Pt's PMH includes breast cancer, morbid obesity.    PT Comments    Tina Delgado is making good progress with mobility.  She ambulated 400 ft using RW with verbal cues for improved gait mechanics.  She requires supervision for sit<>stand transfers for safety but demonstrates proper technique using RW.  Pt will benefit from continued skilled PT services to increase functional independence and safety.    Follow Up Recommendations  Home health PT     Equipment Recommendations  Other (comment) (Bariatric BSC, potentially Bariatric RW )    Recommendations for Other Services OT consult     Precautions / Restrictions Precautions Precautions: Fall Restrictions Weight Bearing Restrictions: Yes RLE Weight Bearing: Weight bearing as tolerated    Mobility  Bed Mobility Overal bed mobility: Needs Assistance Bed Mobility: Sit to Supine       Sit to supine: Modified independent (Device/Increase time)   General bed mobility comments: Pt performs quickly but safely without physical assist or cues  Transfers Overall transfer level: Needs assistance Equipment used:  (Bariatric RW) Transfers: Sit to/from Stand Sit to Stand: Min guard         General transfer comment: Pt able to stand without using momentum this afternoon with proper hand placement and safe technique using RW.  Supervision for safety.  Ambulation/Gait Ambulation/Gait assistance: Supervision Ambulation Distance (Feet): 400 Feet Assistive device:  (Bariatric RW) Gait Pattern/deviations: Step-to pattern;Step-through pattern;Decreased stride length;Decreased stance time - right;Decreased step length - left;Decreased weight shift to right;Antalgic;Trunk flexed;Wide base of support Gait velocity: decreased Gait velocity interpretation:  Below normal speed for age/gender General Gait Details: Min verbal cues for forward gaze.  Cues for heel strike with R foot and to relax shoulders.  Pt ambulates 400 ft without need of rest break.     Stairs            Wheelchair Mobility    Modified Rankin (Stroke Patients Only)       Balance Overall balance assessment: Needs assistance Sitting-balance support: No upper extremity supported;Feet supported Sitting balance-Leahy Scale: Good     Standing balance support: Single extremity supported;During functional activity Standing balance-Leahy Scale: Poor Standing balance comment: Pt continues to require at least 1 UE supported during static and dynamic activities                            Cognition Arousal/Alertness: Awake/alert Behavior During Therapy: WFL for tasks assessed/performed Overall Cognitive Status: Within Functional Limits for tasks assessed                                        Exercises Total Joint Exercises Ankle Circles/Pumps: AROM;Both;10 reps;Seated Quad Sets: Strengthening;Both;10 reps;Other (comment);Seated (with 5 second holds) Straight Leg Raises: Strengthening;Right;10 reps;Seated Long Arc Quad: Strengthening;Right;Seated;10 reps Knee Flexion: AAROM;Right;5 reps;Seated;Other (comment) (with 5 second holds) Goniometric ROM: 89 deg R knee flexion with AAROM exercise    General Comments General comments (skin integrity, edema, etc.): When taking R knee flexion AAROM measurement noticed bruising along medial R thigh.  RN aware and reports this is from the pt's tourniquet during surgery.      Pertinent Vitals/Pain Pain Assessment: Faces Faces Pain Scale:  Hurts little more Pain Location: R knee Pain Descriptors / Indicators: Aching Pain Intervention(s): Limited activity within patient's tolerance;Monitored during session;Repositioned;Other (comment) (Polar ice applied at end of session)    Home Living                       Prior Function            PT Goals (current goals can now be found in the care plan section) Acute Rehab PT Goals Patient Stated Goal: to go home PT Goal Formulation: With patient Time For Goal Achievement: 02/03/17 Potential to Achieve Goals: Good Progress towards PT goals: Progressing toward goals    Frequency    BID      PT Plan Current plan remains appropriate    Co-evaluation             End of Session Equipment Utilized During Treatment: Gait belt Activity Tolerance: Patient tolerated treatment well Patient left: in chair;with call bell/phone within reach;with chair alarm set;Other (comment) (with polar care in place) Nurse Communication: Mobility status;Weight bearing status PT Visit Diagnosis: Pain;Difficulty in walking, not elsewhere classified (R26.2);Unsteadiness on feet (R26.81) Pain - Right/Left: Right Pain - part of body: Knee     Time: 2671-2458 PT Time Calculation (min) (ACUTE ONLY): 26 min  Charges:  $Gait Training: 8-22 mins $Therapeutic Exercise: 8-22 mins                    G Codes:       Tina Delgado PT, DPT 01/20/2017, 3:26 PM

## 2017-01-20 NOTE — Evaluation (Signed)
Physical Therapy Evaluation Patient Details Name: Tina Delgado MRN: [id number] DOB: 1956-04-12 Today's Date: 01/20/2017   History of Present Illness  Tina Delgado is a 61 y/o F s/p R TKA.  Tina Delgado's PMH includes breast cancer, morbid obesity.  Clinical Impression  Tina Delgado is s/p R TKA resulting in the deficits listed below (see Tina Delgado Problem List). Tina Delgado was Ind PTA and denies any falls over the past 6 months. She currently requires min guard assist for sit<>stand transfers and when ambulating 125 ft using Bariatric RW.  She has a RW at home but is not sure if it is Bariatric or not, daughter to bring to hospital later today.  She will need a Bariatric BSC and will need a Bariatric RW if the one she has at home is normal. She will have 24/7 assist/supervision from her daughter and additional family members at d/c.  Tina Delgado will benefit from skilled Tina Delgado to increase their independence and safety with mobility to allow discharge to the venue listed below.     Follow Up Recommendations Home health Tina Delgado    Equipment Recommendations  Other (comment) (Bariatric BSC, potentially Bariatric RW )    Recommendations for Other Services OT consult     Precautions / Restrictions Precautions Precautions: Fall Restrictions Weight Bearing Restrictions: Yes RLE Weight Bearing: Weight bearing as tolerated      Mobility  Bed Mobility Overal bed mobility: Needs Assistance Bed Mobility: Supine to Sit     Supine to sit: Min guard;HOB elevated     General bed mobility comments: Tina Delgado uses bed rail but moves quickly to sitting EOB without physical assist or cues needed.  Transfers Overall transfer level: Needs assistance Equipment used:  (Bariatric RW) Transfers: Sit to/from Stand Sit to Stand: Min guard         General transfer comment: Tina Delgado rocks to use momentum to stand from bed.  She demonstrates proper hand placement and safe technique.  Close min guard for safety as Tina Delgado mildly  unsteady.  Ambulation/Gait Ambulation/Gait assistance: Min guard Ambulation Distance (Feet): 125 Feet Assistive device:  (Bariatric RW) Gait Pattern/deviations: Step-to pattern;Step-through pattern;Decreased stride length;Decreased stance time - right;Decreased step length - left;Decreased weight shift to right;Antalgic;Trunk flexed;Wide base of support Gait velocity: decreased Gait velocity interpretation: Below normal speed for age/gender General Gait Details: Cues for forward gaze and for sequencing using RW.  Tina Delgado initially demonstrating step to gait pattern and transitions to step through.  Tina Delgado reports fatigue at end of ambulation.  Stairs            Wheelchair Mobility    Modified Rankin (Stroke Patients Only)       Balance Overall balance assessment: Needs assistance Sitting-balance support: No upper extremity supported;Feet supported Sitting balance-Leahy Scale: Good     Standing balance support: Single extremity supported;During functional activity Standing balance-Leahy Scale: Poor Standing balance comment: Relies on at least 1 UE support for static standing or self hypgeine following toileting.  BUEs supported on RW for dynamic activities.                             Pertinent Vitals/Pain Pain Assessment: Faces Faces Pain Scale: Hurts little more Pain Location: R knee Pain Descriptors / Indicators: Aching Pain Intervention(s): Limited activity within patient's tolerance;Monitored during session;Repositioned;Other (comment);Premedicated before session (Polar care applied at end of session)    Home Living Family/patient expects to be discharged to:: Private residence Living Arrangements: Children Available Help at Discharge:  Family;Available 24 hours/day (daughter taking off work to assist 24/7 at Tina Delgado's d/c) Type of Home: House Home Access: Stairs to enter Entrance Stairs-Rails: None Entrance Stairs-Number of Steps: 2 Home Layout: One level Home  Equipment: Environmental consultant - 2 wheels;Cane - single point;Wheelchair - manual;Shower seat (Tina Delgado will need Bariatric BSC, only has normal size) Additional Comments: Tina Delgado reports she has a RW but she is not sure if it is Bariatric size.  Daughter will be bringing RW by this afternoon.    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  She was ambulating without AD and denies any falls over the past 6 months.       Hand Dominance        Extremity/Trunk Assessment   Upper Extremity Assessment Upper Extremity Assessment: Overall WFL for tasks assessed    Lower Extremity Assessment Lower Extremity Assessment: RLE deficits/detail RLE Deficits / Details: Strength grossly at least 3/5       Communication   Communication: No difficulties  Cognition Arousal/Alertness: Awake/alert Behavior During Therapy: WFL for tasks assessed/performed Overall Cognitive Status: Within Functional Limits for tasks assessed                                        General Comments      Exercises Total Joint Exercises Ankle Circles/Pumps: AROM;Both;10 reps;Supine Quad Sets: Strengthening;Both;10 reps;Supine;Other (comment) (with 5 second holds) Gluteal Sets: Strengthening;Both;10 reps;Supine;Other (comment) (with 5 second holds) Long [employer]: Strengthening;Right;Seated;10 reps Knee Flexion: AAROM;Right;5 reps;Seated;Other (comment) (with 5 second holds) Goniometric ROM: 88 deg R knee flexion with AAROM exercise   Assessment/Plan    Tina Delgado Assessment Patient needs continued Tina Delgado services  Tina Delgado Problem List Decreased strength;Decreased range of motion;Decreased activity tolerance;Decreased balance;Decreased mobility;Decreased knowledge of use of DME;Decreased safety awareness;Decreased knowledge of precautions;Pain;Obesity       Tina Delgado Treatment Interventions DME instruction;Gait training;Stair training;Functional mobility training;Therapeutic  activities;Therapeutic exercise;Balance training;Neuromuscular re-education;Patient/family education;Modalities    Tina Delgado Goals (Current goals can be found in the Care Plan section)  Acute Rehab Tina Delgado Goals Patient Stated Goal: to go home Tina Delgado Goal Formulation: With patient Time For Goal Achievement: 02/03/17 Potential to Achieve Goals: Good    Frequency BID   Barriers to discharge        Co-evaluation               End of Session Equipment Utilized During Treatment: Gait belt Activity Tolerance: Patient tolerated treatment well Patient left: in chair;with call bell/phone within reach;with chair alarm set Nurse Communication: Mobility status;Weight bearing status Tina Delgado Visit Diagnosis: Pain;Difficulty in walking, not elsewhere classified (R26.2);Unsteadiness on feet (R26.81) Pain - Right/Left: Right Pain - part of body: Knee    Time: 3354-5625 Tina Delgado Time Calculation (min) (ACUTE ONLY): 40 min   Charges:   Tina Delgado Evaluation $Tina Delgado Eval Low Complexity: 1 Procedure Tina Delgado Treatments $Gait Training: 8-22 mins $Therapeutic Exercise: 8-22 mins   Tina Delgado G Codes:        Collie Siad Tina Delgado, DPT 01/20/2017, 11:08 AM

## 2017-01-20 NOTE — Progress Notes (Signed)
Subjective: 1 Day Post-Op Procedure(s) (LRB): TOTAL KNEE ARTHROPLASTY (Right) Patient reports pain as mild.   Patient is well, and has had no acute complaints or problems Care Management and PT to assist with discharge planning. Negative for chest pain and shortness of breath Fever: no Gastrointestinal:Negative for nausea and vomiting  Objective: Vital signs in last 24 hours: Temp:  [96.9 F (36.1 C)-98.7 F (37.1 C)] 98.6 F (37 C) (03/28 0451) Pulse Rate:  [62-80] 66 (03/28 0451) Resp:  [11-23] 20 (03/28 0451) BP: (110-152)/(59-97) 139/81 (03/28 0451) SpO2:  [91 %-100 %] 98 % (03/28 0451) Weight:  [134.3 kg (296 lb)] 134.3 kg (296 lb) (03/27 0936)  Intake/Output from previous day:  Intake/Output Summary (Last 24 hours) at 01/20/17 0743 Last data filed at 01/20/17 0000  Gross per 24 hour  Intake          2628.33 ml  Output             1010 ml  Net          1618.33 ml    Intake/Output this shift: No intake/output data recorded.  Labs:  Recent Labs  01/20/17 0359  HGB 14.8    Recent Labs  01/20/17 0359  WBC 16.4*  RBC 4.93  HCT 43.3  PLT 218    Recent Labs  01/20/17 0359  NA 137  K 4.2  CL 103  CO2 28  BUN 14  CREATININE 0.89  GLUCOSE 153*  CALCIUM 9.0   No results for input(s): LABPT, INR in the last 72 hours.   EXAM General - Patient is Alert, Appropriate and Oriented Extremity - ABD soft Sensation intact distally Intact pulses distally Dorsiflexion/Plantar flexion intact Incision: dressing C/D/I No cellulitis present Dressing/Incision - clean, dry, no drainage Motor Function - intact, moving foot and toes well on exam.  Abdomen is soft this morning with active bowel sounds.  Past Medical History:  Diagnosis Date  . Allergy   . Arthritis    s/p knee injection per ortho  . Breast cancer ([place]) 2014   RT LUMPECTOMY  . Dyspnea   . Elevated glucose 05/2003   106  . GERD (gastroesophageal reflux disease)   . Hyperlipidemia   .  Hypertension   . Malignant neoplasm of upper-outer quadrant of female breast [facility]) July 20, 2013   histologic grade 3 invasive mammary carcinoma, 13 mm,  2/16 nodes positive, ER-positive, PR positive, HER-2/neu not amplified.T1c,N1a  . Mammographic microcalcification   . Morbid obesity ([place])   . Personal history of chemotherapy 2014   BREAST CA  . Personal history of radiation therapy 2014   BREAST CA  . Torn rotator cuff 12/2015   left   Assessment/Plan: 1 Day Post-Op Procedure(s) (LRB): TOTAL KNEE ARTHROPLASTY (Right) Active Problems:   Status post total knee replacement using cement, right  Estimated body mass index is 49.26 kg/m as calculated from the following:   Height as of this encounter: 5' 5"  (1.651 m).   Weight as of this encounter: 134.3 kg (296 lb). Advance diet Up with therapy D/C IV fluids when tolerating a po diet.  Labs reviewed, WBC 16.4.  No fevers, denies any SOB or urinary symptoms. Encouraged incentive spirometer. CBC and BMP ordered for tomorrow morning. Up with therapy today. Plan will be for possible discharge tomorrow pending progress with therapy. Begin working on having a BM.  DVT Prophylaxis - Lovenox, Foot Pumps and TED hose Weight-Bearing as tolerated to right leg  J. Cameron Proud, PA-C [facility] 01/20/2017, 7:43 AM

## 2017-01-20 NOTE — Anesthesia Postprocedure Evaluation (Signed)
Anesthesia Post Note  Patient: Tina Delgado  Procedure(s) Performed: Procedure(s) (LRB): TOTAL KNEE ARTHROPLASTY (Right)  Patient location during evaluation: Nursing Unit Anesthesia Type: Spinal Level of consciousness: awake, awake and alert and oriented Pain management: pain level controlled Vital Signs Assessment: post-procedure vital signs reviewed and stable Respiratory status: spontaneous breathing, nonlabored ventilation and respiratory function stable Cardiovascular status: blood pressure returned to baseline and stable Postop Assessment: no headache and no backache Anesthetic complications: no     Last Vitals:  Vitals:   01/20/17 0451 01/20/17 0750  BP: 139/81 (!) 141/64  Pulse: 66 65  Resp: 20 16  Temp: 37 C 36.7 C    Last Pain:  Vitals:   01/20/17 0750  TempSrc: Oral  PainSc:                  Ricki Miller

## 2017-01-20 NOTE — Care Management Note (Addendum)
Case Management Note  Patient Details  Name: Tina Delgado MRN: [id number] Date of Birth: Jan 15, 1956  Subjective/Objective:  POD # 1 right TKA. Met with patient at bedside to discuss discharge planning. Patient lives at home with her children. She has a walker but will need a bariatric BSC. Ordered from [facility].  Offered choice of home health agencies and patient chooses [facility] since she has used them in the past for her mother. Referral to [facility] with [facility]. [phone].  Called Lovenox 40 mg # 14 no refills.                  Action/Plan: [facility] for HHPT, Lovenox called in. Bariatric BSC from [facility].   Expected Discharge Date:  01/22/17               Expected Discharge Plan:  [place]  In-House Referral:     Discharge planning Services  CM Consult  Post Acute Care Choice:  Home Health, Durable Medical Equipment Choice offered to:  Patient  DME Arranged:  Bedside commode DME Agency:     HH Arranged:  PT [place]:  [place]  Status of Service:  In process, will continue to follow  If discussed at Long Length of Stay Meetings, dates discussed:    Additional Comments:  Jolly Mango, RN 01/20/2017, 11:53 AM

## 2017-01-20 NOTE — Progress Notes (Signed)
Clinical Social Worker (CSW) received SNF consult. PT is recommending home health. RN case manager aware of above. Please reconsult if future social work needs arise. CSW signing off.   Saber Dickerman, LCSW [phone] 

## 2017-01-21 LAB — BASIC METABOLIC PANEL
Anion gap: 5 (ref 5–15)
BUN: 21 mg/dL — ABNORMAL HIGH (ref 6–20)
CO2: 28 mmol/L (ref 22–32)
Calcium: 8.8 mg/dL — ABNORMAL LOW (ref 8.9–10.3)
Chloride: 106 mmol/L (ref 101–111)
Creatinine, Ser: 0.9 mg/dL (ref 0.44–1.00)
GFR calc Af Amer: >60 mL/min (ref >60)
GFR calc non Af Amer: >60 mL/min (ref >60)
Glucose, Bld: 111 mg/dL — ABNORMAL HIGH (ref 65–99)
Potassium: 4 mmol/L (ref 3.5–5.1)
Sodium: 139 mmol/L (ref 135–145)

## 2017-01-21 LAB — CBC
HCT: 40.1 % (ref 35.0–47.0)
Hemoglobin: 13.6 g/dL (ref 12.0–16.0)
MCH: 30 pg (ref 26.0–34.0)
MCHC: 34 g/dL (ref 32.0–36.0)
MCV: 88.3 fL (ref 80.0–100.0)
Platelets: 165 10*3/uL (ref 150–440)
RBC: 4.54 MIL/uL (ref 3.80–5.20)
RDW: 14.4 % (ref 11.5–14.5)
WBC: 12.3 10*3/uL — ABNORMAL HIGH (ref 3.6–11.0)

## 2017-01-21 MED ORDER — OXYCODONE HCL 5 MG PO TABS: 5.0000 mg | ORAL_TABLET | ORAL | 0 refills | Status: DC | PRN

## 2017-01-21 NOTE — Progress Notes (Signed)
Notified MD that patient hasn't had a BM, but would like to go home. BS presents and ABD soft and round. Dr. Roland Rack stated that she could leave without having a BM.  Reviewed discharge instructions including meds, scripts, and last dose given. Dressing changed and extra one sent home. IV removed with cath intact.

## 2017-01-21 NOTE — Discharge Summary (Signed)
Physician Discharge Summary  Patient ID: Tina Delgado MRN: [id number] DOB/AGE: [birth year] 61 y.o.  Admit date: 01/19/2017 Discharge date: 01/21/2017  Admission Diagnoses:  PRIMARY OSTEOARTHRITIS OF RIGHT KNEE  Discharge Diagnoses: Patient Active Problem List   Diagnosis Date Noted  . Status post total knee replacement using cement, right 01/19/2017  . Nausea without vomiting 08/06/2016  . Advance care planning 06/23/2015  . Left shoulder pain 06/23/2015  . OA (osteoarthritis) of knee 06/23/2015  . Lymphedema 11/13/2013  . Breast cancer of lower-inner quadrant of right female breast ([place]) 07/25/2013  . Leg cramps 06/10/2012  . Knee pain 06/10/2012  . Routine general medical examination at a health care facility 03/31/2011  . HLD (hyperlipidemia) 08/25/2010  . HEMORRHOIDS, EXTERNAL 02/26/2010  . Hyperglycemia 03/21/2007  . ALLERGY 03/21/2007  . Essential hypertension 10/31/2006  Right knee degenerative joint disease.  Past Medical History:  Diagnosis Date  . Allergy   . Arthritis    s/p knee injection per ortho  . Breast cancer ([place]) 2014   RT LUMPECTOMY  . Dyspnea   . Elevated glucose 05/2003   106  . GERD (gastroesophageal reflux disease)   . Hyperlipidemia   . Hypertension   . Malignant neoplasm of upper-outer quadrant of female breast [facility]) July 20, 2013   histologic grade 3 invasive mammary carcinoma, 13 mm,  2/16 nodes positive, ER-positive, PR positive, HER-2/neu not amplified.T1c,N1a  . Mammographic microcalcification   . Morbid obesity ([place])   . Personal history of chemotherapy 2014   BREAST CA  . Personal history of radiation therapy 2014   BREAST CA  . Torn rotator cuff 12/2015   left   Transfusion: None   Consultants (if any):   Discharged Condition: Improved  Hospital Course: Amazing Cowman is an 61 y.o. female who was admitted 01/19/2017 with a diagnosis of right knee degenerative joint disease and went to the operating room on  01/19/2017 and underwent the above named procedures.    Surgeries: Procedure(s): TOTAL KNEE ARTHROPLASTY on 01/19/2017 Patient tolerated the surgery well. Taken to PACU where she was stabilized and then transferred to the orthopedic floor.  Started on Lovenox 28m q 12 hrs. Foot pumps applied bilaterally at 80 mm. Heels elevated on bed with rolled towels. No evidence of DVT. Negative [place]. Physical therapy started on day #1 for gait training and transfer. OT started day #1 for ADL and assisted devices.  Patient's IV and foley were removed on POD1.  Implants: Right TKA using all-cemented [employer] system with a 67.5 mm mm PCR femur, a 71 mm tibial tray with a 12 mm E-poly insert, and a 31 x 8 mm all-poly 3-pegged domed patella.  She was given perioperative antibiotics:  Anti-infectives    Start     Dose/Rate Route Frequency Ordered Stop   01/19/17 2200  vancomycin (VANCOCIN) IVPB 1000 mg/200 mL premix     1,000 mg 200 mL/hr over 60 Minutes Intravenous Every 12 hours 01/19/17 1506 01/19/17 2325   01/19/17 0045  vancomycin (VANCOCIN) IVPB 1000 mg/200 mL premix     1,000 mg 200 mL/hr over 60 Minutes Intravenous  Once 01/19/17 0037 01/19/17 1111    .  She was given sequential compression devices, early ambulation, and Lovenox for DVT prophylaxis.  She benefited maximally from the hospital stay and there were no complications.    Recent vital signs:  Vitals:   01/20/17 1551 01/21/17 0815  BP: 134/69 (!) 150/81  Pulse: (!) 57 61  Resp: 18 20  Temp: 98.1 F (36.7 C) 98.5 F (36.9 C)    Recent laboratory studies:  Lab Results  Component Value Date   HGB 13.6 01/21/2017   HGB 14.8 01/20/2017   HGB 16.3 (H) 01/13/2017   Lab Results  Component Value Date   WBC 12.3 (H) 01/21/2017   PLT 165 01/21/2017   Lab Results  Component Value Date   INR 0.99 01/13/2017   Lab Results  Component Value Date   NA 139 01/21/2017   K 4.0 01/21/2017   CL 106 01/21/2017   CO2 28  01/21/2017   BUN 21 (H) 01/21/2017   CREATININE 0.90 01/21/2017   GLUCOSE 111 (H) 01/21/2017    Discharge Medications:   Allergies as of 01/21/2017      Reactions   Latex Rash   More adhesive allergy than latex.  Has never been RAST tested. Thinks paper tape is okay.   Lisinopril Cough   Sulfonamide Derivatives Hives   REACTION: itching years ago      Medication List    TAKE these medications   amoxicillin 500 MG capsule Commonly known as:  AMOXIL Take 2,000 mg by mouth See admin instructions. Takes 1 hour prior to dental procdures   anastrozole 1 MG tablet Commonly known as:  ARIMIDEX TAKE 1 TABLET (1 MG TOTAL) BY MOUTH DAILY. What changed:  when to take this   Black Cohosh 540 MG Caps Take 540 mg by mouth every evening.   CALCIUM 600 + D PO Take 1 tablet by mouth 2 (two) times daily.   CALCIUM 600+D PO Take 1 tablet by mouth 2 (two) times daily.   fluticasone 50 MCG/ACT nasal spray Commonly known as:  FLONASE Place 1 spray into both nostrils daily as needed for allergies or rhinitis.   metoprolol 100 MG tablet Commonly known as:  LOPRESSOR Take 1 tablet (100 mg total) by mouth 2 (two) times daily.   naproxen sodium 220 MG tablet Commonly known as:  ANAPROX Take 440 mg by mouth 2 (two) times daily as needed (pain).   omeprazole 20 MG capsule Commonly known as:  PRILOSEC Take 20 mg by mouth daily.   OVER THE COUNTER MEDICATION Place 1 patch onto the skin daily as needed (pain). [facility] brand pain relief patch   oxyCODONE 5 MG immediate release tablet Commonly known as:  Oxy IR/ROXICODONE Take 1-2 tablets (5-10 mg total) by mouth every 4 (four) hours as needed for breakthrough pain.   simvastatin 40 MG tablet Commonly known as:  ZOCOR Take 1 tablet (40 mg total) by mouth at bedtime.            Durable Medical Equipment        Start     Ordered   01/19/17 1507  DME Walker rolling  Once    Question:  Patient needs a walker to treat with the following  condition  Answer:  Status post total knee replacement using cement, right   01/19/17 1506   01/19/17 1507  DME 3 n 1  Once     01/19/17 1506   01/19/17 1507  DME Bedside commode  Once    Question:  Patient needs a bedside commode to treat with the following condition  Answer:  Status post total knee replacement using cement, right   01/19/17 1506      Diagnostic Studies: Dg Chest 2 View  Result Date: 01/13/2017 CLINICAL DATA:  Preop testing. EXAM: CHEST  2 VIEW COMPARISON:  None. FINDINGS: Mediastinum and hilar  structures are normal. Lungs are clear. Heart size normal. No pleural effusion or pneumothorax. Degenerative changes thoracic spine. Thoracic spine scoliosis. IMPRESSION: No acute cardiopulmonary disease . Electronically Signed   By: Marcello Moores  Register   On: 01/13/2017 13:59   Dg Knee Right Port  Result Date: 01/19/2017 CLINICAL DATA:  Postop knee replacement. EXAM: PORTABLE RIGHT KNEE - 1-2 VIEW COMPARISON:  No recent . FINDINGS: Total right knee replacement. Hardware intact. Normal alignment. No acute bony abnormality identified. IMPRESSION: Total right knee replacement.  No acute abnormality. Electronically Signed   By: Marcello Moores  Register   On: 01/19/2017 14:09   Disposition:  Plan will be for discharge home this afternoon pending BM.  Follow-up Information    Judson Roch, PA-C Follow up in 14 day(s).   Specialty:  Physician Assistant Why:  Levert Feinstein Removal Contact information: [place] [address] [phone]          Signed: Judson Roch PA-C 01/21/2017, 10:55 AM

## 2017-01-21 NOTE — Progress Notes (Signed)
  Subjective: 2 Days Post-Op Procedure(s) (LRB): TOTAL KNEE ARTHROPLASTY (Right) Patient reports pain as mild.   Patient is well, and has had no acute complaints or problems Plan is for discharge home later today with HHPT. Negative for chest pain and shortness of breath Fever: no Gastrointestinal:Negative for nausea and vomiting  Objective: Vital signs in last 24 hours: Temp:  [98.1 F (36.7 C)-98.5 F (36.9 C)] 98.5 F (36.9 C) (03/29 0815) Pulse Rate:  [55-61] 61 (03/29 0815) Resp:  [18-20] 20 (03/29 0815) BP: (134-150)/(69-81) 150/81 (03/29 0815) SpO2:  [96 %-99 %] 96 % (03/29 0815)  Intake/Output from previous day: No intake or output data in the 24 hours ending 01/21/17 1052  Intake/Output this shift: No intake/output data recorded.  Labs:  Recent Labs  01/20/17 0359 01/21/17 0546  HGB 14.8 13.6    Recent Labs  01/20/17 0359 01/21/17 0546  WBC 16.4* 12.3*  RBC 4.93 4.54  HCT 43.3 40.1  PLT 218 165    Recent Labs  01/20/17 0359 01/21/17 0546  NA 137 139  K 4.2 4.0  CL 103 106  CO2 28 28  BUN 14 21*  CREATININE 0.89 0.90  GLUCOSE 153* 111*  CALCIUM 9.0 8.8*   No results for input(s): LABPT, INR in the last 72 hours.   EXAM General - Patient is Alert, Appropriate and Oriented Extremity - ABD soft Sensation intact distally Intact pulses distally Dorsiflexion/Plantar flexion intact Incision: dressing C/D/I No cellulitis present Dressing/Incision - clean, dry, no drainage Motor Function - intact, moving foot and toes well on exam.  Abdomen is soft this morning with active bowel sounds.  Past Medical History:  Diagnosis Date  . Allergy   . Arthritis    s/p knee injection per ortho  . Breast cancer ([place]) 2014   RT LUMPECTOMY  . Dyspnea   . Elevated glucose 05/2003   106  . GERD (gastroesophageal reflux disease)   . Hyperlipidemia   . Hypertension   . Malignant neoplasm of upper-outer quadrant of female breast [facility]) July 20, 2013   histologic grade 3 invasive mammary carcinoma, 13 mm,  2/16 nodes positive, ER-positive, PR positive, HER-2/neu not amplified.T1c,N1a  . Mammographic microcalcification   . Morbid obesity ([place])   . Personal history of chemotherapy 2014   BREAST CA  . Personal history of radiation therapy 2014   BREAST CA  . Torn rotator cuff 12/2015   left   Assessment/Plan: 2 Days Post-Op Procedure(s) (LRB): TOTAL KNEE ARTHROPLASTY (Right) Active Problems:   Status post total knee replacement using cement, right  Estimated body mass index is 49.26 kg/m as calculated from the following:   Height as of this encounter: '5\' 5"'$  (1.651 m).   Weight as of this encounter: 134.3 kg (296 lb). Up with therapy   Labs reviewed, WBC 12.3, decreased compared to yesterday.  No fevers, denies any SOB or urinary symptoms. Up with therapy today.  Pt has done stairs as well as ambulated twice around the nursing station. Continue to work on having a BM. Plan will be for discharge this afternoon.  DVT Prophylaxis - Lovenox, Foot Pumps and TED hose Weight-Bearing as tolerated to right leg  J. Cameron Proud, PA-C [facility] Orthopaedic Surgery 01/21/2017, 10:52 AM

## 2017-01-21 NOTE — Care Management Note (Signed)
Case Management Note  Patient Details  Name: Tina Delgado MRN: [id number] Date of Birth: 1956/07/19  Subjective/Objective:  Discharging today   Action/Plan: Advanced notified of discharge. Cost of Lovenox is $ 10.00. Patient updated and is agreeable to POC.   Expected Discharge Date:  01/21/17               Expected Discharge Plan:  [place]  In-House Referral:     Discharge planning Services  CM Consult  Post Acute Care Choice:  Home Health, Durable Medical Equipment Choice offered to:  Patient  DME Arranged:  Bedside commode DME Agency:     HH Arranged:  PT [place]:  [place]  Status of Service:  In process, will continue to follow  If discussed at Long Length of Stay Meetings, dates discussed:    Additional Comments:  Jolly Mango, RN 01/21/2017, 11:14 AM

## 2017-01-21 NOTE — Discharge Instructions (Signed)

## 2017-01-21 NOTE — Progress Notes (Signed)
qPhysical Therapy Treatment Patient Details Name: Tina Delgado MRN: [id number] DOB: 1956/10/11 Today's Date: 01/21/2017    History of Present Illness Pt is a 61 y/o F s/p R TKA.  Pt's PMH includes breast cancer, morbid obesity.    PT Comments    Ambulated around unit x 1 with walker and supervision.  Pt requested to use bathroom upon return to room as she is awaiting BM prior to discharge. Pt requested increased time in bathroom.  She was instructed to use call bell when finished and nursing aware.     Follow Up Recommendations  Home health PT     Equipment Recommendations       Recommendations for Other Services OT consult     Precautions / Restrictions Precautions Precautions: Fall Restrictions Weight Bearing Restrictions: Yes RLE Weight Bearing: Weight bearing as tolerated    Mobility  Bed Mobility Overal bed mobility: Modified Independent Bed Mobility: Supine to Sit     Supine to sit: Modified independent (Device/Increase time);HOB elevated Sit to supine: Modified independent (Device/Increase time)   General bed mobility comments: in recliner  Transfers Overall transfer level: Modified independent Equipment used: Rolling walker (2 wheeled) Transfers: Sit to/from Stand Sit to Stand: Supervision         General transfer comment: Pt able to stand without using momentum this afternoon with proper hand placement and safe technique using RW.  Supervision for safety.  Ambulation/Gait Ambulation/Gait assistance: Supervision Ambulation Distance (Feet): 160 Feet Assistive device: Rolling walker (2 wheeled) Gait Pattern/deviations: Step-through pattern Gait velocity: decreased Gait velocity interpretation: Below normal speed for age/gender     Stairs Stairs: Yes   Stair Management: One rail Right Number of Stairs: 8 General stair comments:  X 4 bilateral rails, x 4 right rail - has metal pole to grab and SPC at home to use as second support if  needed  Wheelchair Mobility    Modified Rankin (Stroke Patients Only)       Balance Overall balance assessment: Modified Independent Sitting-balance support: No upper extremity supported;Feet supported Sitting balance-Leahy Scale: Good     Standing balance support: Bilateral upper extremity supported Standing balance-Leahy Scale: Good Standing balance comment: Pt continues to require at least 1 UE supported during static and dynamic activities                            Cognition Arousal/Alertness: Awake/alert Behavior During Therapy: WFL for tasks assessed/performed Overall Cognitive Status: Within Functional Limits for tasks assessed                                        Exercises Total Joint Exercises Ankle Circles/Pumps: AROM;Both;10 reps;Seated Quad Sets: Strengthening;Both;10 reps;Other (comment);Supine Gluteal Sets: Strengthening;Both;10 reps;Supine;Other (comment) Heel Slides: AROM;Left;Strengthening;10 reps;Supine Straight Leg Raises: AROM;Strengthening;Supine;Left;10 reps Long Arc Quad: AROM;Strengthening;Seated;Left;10 reps Knee Flexion: AROM;Strengthening;Left;Seated;10 reps Goniometric ROM: 93    General Comments        Pertinent Vitals/Pain Pain Assessment: No/denies pain    Home Living                      Prior Function            PT Goals (current goals can now be found in the care plan section) Progress towards PT goals: Progressing toward goals    Frequency    BID  PT Plan Current plan remains appropriate    Co-evaluation             End of Session Equipment Utilized During Treatment: Gait belt Activity Tolerance: Patient tolerated treatment well Patient left: Other (comment) Nurse Communication: Other (comment);Mobility status Pain - Right/Left: Right Pain - part of body: Knee     Time: 8841-6606 PT Time Calculation (min) (ACUTE ONLY): 10 min  Charges:  $Gait Training: 8-22  mins $Therapeutic Exercise: 8-22 mins                    G Codes:       Chesley Noon, PTA 01/21/17, 11:17 AM

## 2017-01-21 NOTE — Progress Notes (Signed)
qPhysical Therapy Treatment Patient Details Name: Tina Delgado MRN: [id number] DOB: May 16, 1956 Today's Date: 01/21/2017    History of Present Illness Pt is a 61 y/o F s/p R TKA.  Pt's PMH includes breast cancer, morbid obesity.    PT Comments    Ready for session.  Denies pain.  Participated in exercises as described below.  Out of bed with general ease with use of rail and HOB raised.  Pt able to ambulate to/from rehab gym on unit with rw and good step through pattern.  Generally steady.  Stair training with 2 rails and right rail only to simulate home environment.  Pt does well and is comfortable with gait activities.     Follow Up Recommendations  Home health PT     Equipment Recommendations       Recommendations for Other Services OT consult     Precautions / Restrictions Precautions Precautions: Fall Restrictions Weight Bearing Restrictions: Yes RLE Weight Bearing: Weight bearing as tolerated    Mobility  Bed Mobility Overal bed mobility: Modified Independent Bed Mobility: Supine to Sit     Supine to sit: Modified independent (Device/Increase time);HOB elevated Sit to supine: Modified independent (Device/Increase time)   General bed mobility comments: uses rail, HOB up  Transfers Overall transfer level: Modified independent Equipment used: Rolling walker (2 wheeled) Transfers: Sit to/from Stand Sit to Stand: Supervision         General transfer comment: Pt able to stand without using momentum this afternoon with proper hand placement and safe technique using RW.  Supervision for safety.  Ambulation/Gait Ambulation/Gait assistance: Supervision Ambulation Distance (Feet): 160 Feet Assistive device: Rolling walker (2 wheeled) Gait Pattern/deviations: Step-through pattern Gait velocity: decreased       Stairs Stairs: Yes   Stair Management: One rail Right Number of Stairs: 8 General stair comments:  X 4 bilateral rails, x 4 right rail - has  metal pole to grab and SPC at home to use as second support if needed  Wheelchair Mobility    Modified Rankin (Stroke Patients Only)       Balance Overall balance assessment: Needs assistance Sitting-balance support: No upper extremity supported;Feet supported Sitting balance-Leahy Scale: Good     Standing balance support: Single extremity supported;During functional activity Standing balance-Leahy Scale: Fair Standing balance comment: Pt continues to require at least 1 UE supported during static and dynamic activities                            Cognition Arousal/Alertness: Awake/alert Behavior During Therapy: WFL for tasks assessed/performed Overall Cognitive Status: Within Functional Limits for tasks assessed                                        Exercises Total Joint Exercises Ankle Circles/Pumps: AROM;Both;10 reps;Seated Quad Sets: Strengthening;Both;10 reps;Other (comment);Supine Gluteal Sets: Strengthening;Both;10 reps;Supine;Other (comment) Heel Slides: AROM;Left;Strengthening;10 reps;Supine Straight Leg Raises: AROM;Strengthening;Supine;Left;10 reps Long Arc Quad: AROM;Strengthening;Seated;Left;10 reps Knee Flexion: AROM;Strengthening;Left;Seated;10 reps Goniometric ROM: 93    General Comments        Pertinent Vitals/Pain Pain Assessment: No/denies pain    Home Living                      Prior Function            PT Goals (current goals can now be found in  the care plan section) Progress towards PT goals: Progressing toward goals    Frequency    BID      PT Plan Current plan remains appropriate    Co-evaluation             End of Session Equipment Utilized During Treatment: Gait belt Activity Tolerance: Patient tolerated treatment well Patient left: in chair;with call bell/phone within reach;with chair alarm set;Other (comment) Nurse Communication: Mobility status;Weight bearing status Pain -  Right/Left: Right Pain - part of body: Knee     Time: 5697-9480 PT Time Calculation (min) (ACUTE ONLY): 24 min  Charges:  $Gait Training: 8-22 mins $Therapeutic Exercise: 8-22 mins                    G Codes:       Chesley Noon, PTA 01/21/17, 9:20 AM

## 2017-01-22 DIAGNOSIS — Z6841 Body Mass Index (BMI) 40.0 and over, adult: Secondary | ICD-10-CM | POA: Diagnosis not present

## 2017-01-22 DIAGNOSIS — I10 Essential (primary) hypertension: Secondary | ICD-10-CM | POA: Diagnosis not present

## 2017-01-22 DIAGNOSIS — Z471 Aftercare following joint replacement surgery: Secondary | ICD-10-CM | POA: Diagnosis not present

## 2017-01-22 DIAGNOSIS — Z791 Long term (current) use of non-steroidal anti-inflammatories (NSAID): Secondary | ICD-10-CM | POA: Diagnosis not present

## 2017-01-22 DIAGNOSIS — K219 Gastro-esophageal reflux disease without esophagitis: Secondary | ICD-10-CM | POA: Diagnosis not present

## 2017-01-22 DIAGNOSIS — Z853 Personal history of malignant neoplasm of breast: Secondary | ICD-10-CM | POA: Diagnosis not present

## 2017-01-22 DIAGNOSIS — Z96651 Presence of right artificial knee joint: Secondary | ICD-10-CM | POA: Diagnosis not present

## 2017-01-25 DIAGNOSIS — Z853 Personal history of malignant neoplasm of breast: Secondary | ICD-10-CM | POA: Diagnosis not present

## 2017-01-25 DIAGNOSIS — I10 Essential (primary) hypertension: Secondary | ICD-10-CM | POA: Diagnosis not present

## 2017-01-25 DIAGNOSIS — Z96651 Presence of right artificial knee joint: Secondary | ICD-10-CM | POA: Diagnosis not present

## 2017-01-25 DIAGNOSIS — Z6841 Body Mass Index (BMI) 40.0 and over, adult: Secondary | ICD-10-CM | POA: Diagnosis not present

## 2017-01-25 DIAGNOSIS — Z791 Long term (current) use of non-steroidal anti-inflammatories (NSAID): Secondary | ICD-10-CM | POA: Diagnosis not present

## 2017-01-25 DIAGNOSIS — K219 Gastro-esophageal reflux disease without esophagitis: Secondary | ICD-10-CM | POA: Diagnosis not present

## 2017-01-25 DIAGNOSIS — Z471 Aftercare following joint replacement surgery: Secondary | ICD-10-CM | POA: Diagnosis not present

## 2017-01-27 DIAGNOSIS — Z853 Personal history of malignant neoplasm of breast: Secondary | ICD-10-CM | POA: Diagnosis not present

## 2017-01-27 DIAGNOSIS — Z96651 Presence of right artificial knee joint: Secondary | ICD-10-CM | POA: Diagnosis not present

## 2017-01-27 DIAGNOSIS — Z471 Aftercare following joint replacement surgery: Secondary | ICD-10-CM | POA: Diagnosis not present

## 2017-01-27 DIAGNOSIS — Z6841 Body Mass Index (BMI) 40.0 and over, adult: Secondary | ICD-10-CM | POA: Diagnosis not present

## 2017-01-27 DIAGNOSIS — I10 Essential (primary) hypertension: Secondary | ICD-10-CM | POA: Diagnosis not present

## 2017-01-27 DIAGNOSIS — K219 Gastro-esophageal reflux disease without esophagitis: Secondary | ICD-10-CM | POA: Diagnosis not present

## 2017-01-27 DIAGNOSIS — Z791 Long term (current) use of non-steroidal anti-inflammatories (NSAID): Secondary | ICD-10-CM | POA: Diagnosis not present

## 2017-01-29 DIAGNOSIS — Z791 Long term (current) use of non-steroidal anti-inflammatories (NSAID): Secondary | ICD-10-CM | POA: Diagnosis not present

## 2017-01-29 DIAGNOSIS — Z6841 Body Mass Index (BMI) 40.0 and over, adult: Secondary | ICD-10-CM | POA: Diagnosis not present

## 2017-01-29 DIAGNOSIS — Z471 Aftercare following joint replacement surgery: Secondary | ICD-10-CM | POA: Diagnosis not present

## 2017-01-29 DIAGNOSIS — K219 Gastro-esophageal reflux disease without esophagitis: Secondary | ICD-10-CM | POA: Diagnosis not present

## 2017-01-29 DIAGNOSIS — Z96651 Presence of right artificial knee joint: Secondary | ICD-10-CM | POA: Diagnosis not present

## 2017-01-29 DIAGNOSIS — I10 Essential (primary) hypertension: Secondary | ICD-10-CM | POA: Diagnosis not present

## 2017-01-29 DIAGNOSIS — Z853 Personal history of malignant neoplasm of breast: Secondary | ICD-10-CM | POA: Diagnosis not present

## 2017-02-01 DIAGNOSIS — K219 Gastro-esophageal reflux disease without esophagitis: Secondary | ICD-10-CM | POA: Diagnosis not present

## 2017-02-01 DIAGNOSIS — I10 Essential (primary) hypertension: Secondary | ICD-10-CM | POA: Diagnosis not present

## 2017-02-01 DIAGNOSIS — Z791 Long term (current) use of non-steroidal anti-inflammatories (NSAID): Secondary | ICD-10-CM | POA: Diagnosis not present

## 2017-02-01 DIAGNOSIS — Z6841 Body Mass Index (BMI) 40.0 and over, adult: Secondary | ICD-10-CM | POA: Diagnosis not present

## 2017-02-01 DIAGNOSIS — Z853 Personal history of malignant neoplasm of breast: Secondary | ICD-10-CM | POA: Diagnosis not present

## 2017-02-01 DIAGNOSIS — Z96651 Presence of right artificial knee joint: Secondary | ICD-10-CM | POA: Diagnosis not present

## 2017-02-01 DIAGNOSIS — Z471 Aftercare following joint replacement surgery: Secondary | ICD-10-CM | POA: Diagnosis not present

## 2017-02-03 DIAGNOSIS — M25561 Pain in right knee: Secondary | ICD-10-CM | POA: Diagnosis not present

## 2017-02-08 ENCOUNTER — Inpatient Hospital Stay (HOSPITAL_BASED_OUTPATIENT_CLINIC_OR_DEPARTMENT_OTHER): Payer: BLUE CROSS/BLUE SHIELD | Admitting: Hematology and Oncology

## 2017-02-08 ENCOUNTER — Encounter: Payer: Self-pay | Admitting: Hematology and Oncology

## 2017-02-08 ENCOUNTER — Encounter: Payer: Self-pay | Admitting: *Deleted

## 2017-02-08 ENCOUNTER — Inpatient Hospital Stay: Payer: BLUE CROSS/BLUE SHIELD | Attending: Hematology and Oncology

## 2017-02-08 VITALS — BP 145/95 | HR 64 | Temp 95.5°F | Resp 18 | Wt 288.4 lb

## 2017-02-08 DIAGNOSIS — I10 Essential (primary) hypertension: Secondary | ICD-10-CM

## 2017-02-08 DIAGNOSIS — M8580 Other specified disorders of bone density and structure, unspecified site: Secondary | ICD-10-CM

## 2017-02-08 DIAGNOSIS — Z800 Family history of malignant neoplasm of digestive organs: Secondary | ICD-10-CM

## 2017-02-08 DIAGNOSIS — Z79811 Long term (current) use of aromatase inhibitors: Secondary | ICD-10-CM | POA: Diagnosis not present

## 2017-02-08 DIAGNOSIS — Z79899 Other long term (current) drug therapy: Secondary | ICD-10-CM

## 2017-02-08 DIAGNOSIS — M1990 Unspecified osteoarthritis, unspecified site: Secondary | ICD-10-CM | POA: Insufficient documentation

## 2017-02-08 DIAGNOSIS — Z923 Personal history of irradiation: Secondary | ICD-10-CM | POA: Diagnosis not present

## 2017-02-08 DIAGNOSIS — C779 Secondary and unspecified malignant neoplasm of lymph node, unspecified: Secondary | ICD-10-CM | POA: Diagnosis not present

## 2017-02-08 DIAGNOSIS — Z170 Estrogen receptor positive status [ER+]: Secondary | ICD-10-CM

## 2017-02-08 DIAGNOSIS — Z853 Personal history of malignant neoplasm of breast: Secondary | ICD-10-CM

## 2017-02-08 DIAGNOSIS — M25561 Pain in right knee: Secondary | ICD-10-CM | POA: Diagnosis not present

## 2017-02-08 DIAGNOSIS — E785 Hyperlipidemia, unspecified: Secondary | ICD-10-CM | POA: Diagnosis not present

## 2017-02-08 DIAGNOSIS — C50311 Malignant neoplasm of lower-inner quadrant of right female breast: Secondary | ICD-10-CM

## 2017-02-08 DIAGNOSIS — K219 Gastro-esophageal reflux disease without esophagitis: Secondary | ICD-10-CM | POA: Diagnosis not present

## 2017-02-08 DIAGNOSIS — Z882 Allergy status to sulfonamides status: Secondary | ICD-10-CM

## 2017-02-08 DIAGNOSIS — Z9221 Personal history of antineoplastic chemotherapy: Secondary | ICD-10-CM | POA: Diagnosis not present

## 2017-02-08 DIAGNOSIS — Z803 Family history of malignant neoplasm of breast: Secondary | ICD-10-CM

## 2017-02-08 DIAGNOSIS — C50411 Malignant neoplasm of upper-outer quadrant of right female breast: Secondary | ICD-10-CM | POA: Diagnosis not present

## 2017-02-08 LAB — CBC WITH DIFFERENTIAL/PLATELET
Basophils Absolute: 0 10*3/uL (ref 0–0.1)
Basophils Relative: 0 %
Eosinophils Absolute: 0.2 10*3/uL (ref 0–0.7)
Eosinophils Relative: 3 %
HCT: 42.5 % (ref 35.0–47.0)
Hemoglobin: 14.6 g/dL (ref 12.0–16.0)
Lymphocytes Relative: 24 %
Lymphs Abs: 1.8 10*3/uL (ref 1.0–3.6)
MCH: 30.2 pg (ref 26.0–34.0)
MCHC: 34.2 g/dL (ref 32.0–36.0)
MCV: 88.1 fL (ref 80.0–100.0)
Monocytes Absolute: 0.6 10*3/uL (ref 0.2–0.9)
Monocytes Relative: 7 %
Neutro Abs: 5.1 10*3/uL (ref 1.4–6.5)
Neutrophils Relative %: 66 %
Platelets: 298 10*3/uL (ref 150–440)
RBC: 4.83 MIL/uL (ref 3.80–5.20)
RDW: 14 % (ref 11.5–14.5)
WBC: 7.7 10*3/uL (ref 3.6–11.0)

## 2017-02-08 LAB — COMPREHENSIVE METABOLIC PANEL
ALT: 32 U/L (ref 14–54)
AST: 26 U/L (ref 15–41)
Albumin: 3.8 g/dL (ref 3.5–5.0)
Alkaline Phosphatase: 78 U/L (ref 38–126)
Anion gap: 4 — ABNORMAL LOW (ref 5–15)
BUN: 12 mg/dL (ref 6–20)
CO2: 33 mmol/L — ABNORMAL HIGH (ref 22–32)
Calcium: 9.5 mg/dL (ref 8.9–10.3)
Chloride: 103 mmol/L (ref 101–111)
Creatinine, Ser: 0.89 mg/dL (ref 0.44–1.00)
GFR calc Af Amer: >60 mL/min (ref >60)
GFR calc non Af Amer: >60 mL/min (ref >60)
Glucose, Bld: 112 mg/dL — ABNORMAL HIGH (ref 65–99)
Potassium: 4.4 mmol/L (ref 3.5–5.1)
Sodium: 140 mmol/L (ref 135–145)
Total Bilirubin: 0.5 mg/dL (ref 0.3–1.2)
Total Protein: 6.9 g/dL (ref 6.5–8.1)

## 2017-02-08 MED ORDER — ANASTROZOLE 1 MG PO TABS: 1.0000 mg | ORAL_TABLET | Freq: Every day | ORAL | 0 refills | Status: DC

## 2017-02-08 NOTE — Progress Notes (Signed)
Patient had right knee replacement March 27th.  Currently in PT, doing well.  Requests refill for anastrazole.

## 2017-02-08 NOTE — Progress Notes (Signed)
[facility] day:  02/08/17  Chief Complaint: Tina Delgado is a 61 y.o. female with a history of stage IIB left breast cancer who is seen for 6 month ssessment.  HPI: The patient was last seen in the medical oncology clinic on 08/12/2016.  At that time, she was doing well.  She had issues with her right knee and a left rotator cuff tear.  Exam was stable.  Labs were normal.  She was admitted to [facility] from 01/19/2017 - 01/21/2017.  She underwent right knee arthroplasty for osteoarthritis by Dr. Milagros Evener on 01/19/2017.  Symptomatically, she is doing well.  Her knee surgery went well. She is in physical therapy. She states that she is back to using a cane.  She denies any breast concerns.   Past Medical History:  Diagnosis Date  . Allergy   . Arthritis    s/p knee injection per ortho  . Breast cancer ([place]) 2014   RT LUMPECTOMY  . Dyspnea   . Elevated glucose 05/2003   106  . GERD (gastroesophageal reflux disease)   . Hyperlipidemia   . Hypertension   . Malignant neoplasm of upper-outer quadrant of female breast [facility]) July 20, 2013   histologic grade 3 invasive mammary carcinoma, 13 mm,  2/16 nodes positive, ER-positive, PR positive, HER-2/neu not amplified.T1c,N1a  . Mammographic microcalcification   . Morbid obesity ([place])   . Personal history of chemotherapy 2014   BREAST CA  . Personal history of radiation therapy 2014   BREAST CA  . Torn rotator cuff 12/2015   left    Past Surgical History:  Procedure Laterality Date  . BREAST EXCISIONAL BIOPSY Right 2014   positive  . BREAST SURGERY Right 07-20-13   Wide excision, SLN biopsy, axillary dissection.  . COLONOSCOPY  [id number]  . JOINT REPLACEMENT  07/30/2007   LTKR Dr Derrel Nip  . KNEE ARTHROSCOPY  03/04/2007   left Dr Derrel Nip  . PORTACATH PLACEMENT  2014  . s/p chemotherapy Right    right breast cancer   . s/p radiation therapy Right    right breast cancer  . TOTAL  KNEE ARTHROPLASTY Right 01/19/2017   Procedure: TOTAL KNEE ARTHROPLASTY;  Surgeon: Corky Mull, MD;  Location: [facility] ORS;  Service: Orthopedics;  Laterality: Right;  . TUBAL LIGATION  ~1986   BTL    Family History  Problem Relation Age of Onset  . Heart disease Mother     MI  . Depression Mother     paranoia  . Hypertension Mother   . Stroke Mother   . Parkinsonism Mother   . Heart disease Brother     MI PTCA at 26  . Heart disease Father   . Stomach cancer Maternal Grandfather 28  . Breast cancer Maternal Aunt   . Breast cancer Cousin   . Colon cancer Paternal Uncle     Social History:  reports that she has never smoked. She has never used smokeless tobacco. She reports that she drinks alcohol. She reports that she does not use drugs.  She lives in [place].  The patient is alone today.  Allergies:  Allergies  Allergen Reactions  . Latex Rash    More adhesive allergy than latex.  Has never been RAST tested. Thinks paper tape is okay.  . Lisinopril Cough  . Sulfonamide Derivatives Hives    REACTION: itching years ago    Current Medications: Current Outpatient Prescriptions  Medication Sig Dispense Refill  .  acetaminophen (TYLENOL) 500 MG tablet Take 500 mg by mouth every 6 (six) hours as needed.    [name] anastrozole (ARIMIDEX) 1 MG tablet TAKE 1 TABLET (1 MG TOTAL) BY MOUTH DAILY. (Patient taking differently: Take 1 mg by mouth every evening. ) 90 tablet 0  . Black Cohosh 540 MG CAPS Take 540 mg by mouth every evening.    . Calcium Carb-Cholecalciferol (CALCIUM 600 + D PO) Take 1 tablet by mouth 2 (two) times daily.    Mariane Baumgarten Sodium (COLACE PO) Take 1 tablet by mouth as needed.    . fluticasone (FLONASE) 50 MCG/ACT nasal spray Place 1 spray into both nostrils daily as needed for allergies or rhinitis.     . metoprolol (LOPRESSOR) 100 MG tablet Take 1 tablet (100 mg total) by mouth 2 (two) times daily. 180 tablet 3  . omeprazole (PRILOSEC) 20 MG capsule Take 20 mg by  mouth daily.    [name] amoxicillin (AMOXIL) 500 MG capsule Take 2,000 mg by mouth See admin instructions. Takes 1 hour prior to dental procdures    . naproxen sodium (ANAPROX) 220 MG tablet Take 440 mg by mouth 2 (two) times daily as needed (pain).     [name] OVER THE COUNTER MEDICATION Place 1 patch onto the skin daily as needed (pain). [employer] brand pain relief patch    . oxyCODONE (OXY IR/ROXICODONE) 5 MG immediate release tablet Take 1-2 tablets (5-10 mg total) by mouth every 4 (four) hours as needed for breakthrough pain. (Patient not taking: Reported on 02/08/2017) 60 tablet 0  . simvastatin (ZOCOR) 40 MG tablet Take 1 tablet (40 mg total) by mouth at bedtime. (Patient not taking: Reported on 08/12/2016) 90 tablet 3   No current facility-administered medications for this visit.     Review of Systems:  GENERAL:  Feels "good".  No fevers or sweats.  Weight down 12 pounds. PERFORMANCE STATUS (ECOG):  1 HEENT:  No visual changes, runny nose, sore throat, mouth sores or tenderness. Lungs: No shortness of breath or cough.  No hemoptysis. Cardiac:  No chest pain, palpitations, orthopnea, or PND. GI:  No nausea, vomiting, diarrhea, constipation, melena or hematochezia. GU:  No urgency, frequency, dysuria, or hematuria. Musculoskeletal:  Arthritis.  Left rotator cuff tear.  s/p right knee arthroplasty.  No back pain.  No muscle tenderness. Extremities:  No pain or swelling. Skin:  No rashes or skin changes. Neuro:  No headache, numbness or weakness, balance or coordination issues. Endocrine:  No diabetes, thyroid issues, hot flashes or night sweats. Psych:  No mood changes, depression or anxiety. Pain:  No focal pain. Review of systems:  All other systems reviewed and found to be negative.  Physical Exam: Blood pressure (!) 145/95, pulse 64, temperature (!) 95.5 F (35.3 C), temperature source Tympanic, resp. rate 18, weight 288 lb 6 oz (130.8 kg). GENERAL:  Well developed, well nourished, heavy set  woman sitting comfortably in the exam room in no acute distress. MENTAL STATUS:  Alert and oriented to person, place and time. HEAD:  Short gray hair.  Normocephalic, atraumatic, face symmetric, no Cushingoid features. EYES:  Blue eyes.  Pupils equal round and reactive to light and accomodation.  No conjunctivitis or scleral icterus. ENT:  Oropharynx clear without lesion.  Tongue normal. Mucous membranes moist.  RESPIRATORY:  Clear to auscultation without rales, wheezes or rhonchi. CARDIOVASCULAR:  Regular rate and rhythm without murmur, rub or gallop. BREAST:  Right breast with well healed incision at the 3 o'clock position.  Mild post radiation changes.  Left breast with minimal fibrocystic changes in the lower inner quadrant.  No masses, skin changes or nipple discharge.  ABDOMEN:  Soft, non-tender, with active bowel sounds, and no hepatosplenomegaly.  No masses. SKIN:  No rashes, ulcers or lesions. EXTREMITIES: No edema, no skin discoloration or tenderness.  No palpable cords. LYMPH NODES: No palpable cervical, supraclavicular, axillary or inguinal adenopathy  NEUROLOGICAL: Unremarkable. PSYCH:  Appropriate.    Appointment on 02/08/2017  Component Date Value Ref Range Status  . WBC 02/08/2017 7.7  3.6 - 11.0 K/uL Final  . RBC 02/08/2017 4.83  3.80 - 5.20 MIL/uL Final  . Hemoglobin 02/08/2017 14.6  12.0 - 16.0 g/dL Final  . HCT 02/08/2017 42.5  35.0 - 47.0 % Final  . MCV 02/08/2017 88.1  80.0 - 100.0 fL Final  . MCH 02/08/2017 30.2  26.0 - 34.0 pg Final  . MCHC 02/08/2017 34.2  32.0 - 36.0 g/dL Final  . RDW 02/08/2017 14.0  11.5 - 14.5 % Final  . Platelets 02/08/2017 298  150 - 440 K/uL Final  . Neutrophils Relative % 02/08/2017 66  % Final  . Neutro Abs 02/08/2017 5.1  1.4 - 6.5 K/uL Final  . Lymphocytes Relative 02/08/2017 24  % Final  . Lymphs Abs 02/08/2017 1.8  1.0 - 3.6 K/uL Final  . Monocytes Relative 02/08/2017 7  % Final  . Monocytes Absolute 02/08/2017 0.6  0.2 - 0.9 K/uL  Final  . Eosinophils Relative 02/08/2017 3  % Final  . Eosinophils Absolute 02/08/2017 0.2  0 - 0.7 K/uL Final  . Basophils Relative 02/08/2017 0  % Final  . Basophils Absolute 02/08/2017 0.0  0 - 0.1 K/uL Final  . Sodium 02/08/2017 140  135 - 145 mmol/L Final  . Potassium 02/08/2017 4.4  3.5 - 5.1 mmol/L Final  . Chloride 02/08/2017 103  101 - 111 mmol/L Final  . CO2 02/08/2017 33* 22 - 32 mmol/L Final  . Glucose, Bld 02/08/2017 112* 65 - 99 mg/dL Final  . BUN 02/08/2017 12  6 - 20 mg/dL Final  . Creatinine, Ser 02/08/2017 0.89  0.44 - 1.00 mg/dL Final  . Calcium 02/08/2017 9.5  8.9 - 10.3 mg/dL Final  . Total Protein 02/08/2017 6.9  6.5 - 8.1 g/dL Final  . Albumin 02/08/2017 3.8  3.5 - 5.0 g/dL Final  . AST 02/08/2017 26  15 - 41 U/L Final  . ALT 02/08/2017 32  14 - 54 U/L Final  . Alkaline Phosphatase 02/08/2017 78  38 - 126 U/L Final  . Total Bilirubin 02/08/2017 0.5  0.3 - 1.2 mg/dL Final  . GFR calc non Af Amer 02/08/2017 >60  >60 mL/min Final  . GFR calc Af Amer 02/08/2017 >60  >60 mL/min Final   Comment: (NOTE) The eGFR has been calculated using the CKD EPI equation. This calculation has not been validated in all clinical situations. eGFR's persistently <60 mL/min signify possible Chronic Kidney Disease.   . Anion gap 02/08/2017 4* 5 - 15 Final    Assessment:  Tina Delgado is a 61 y.o. female with a history of stage IIB right breast cancer s/p lumpectomy on 05/2013.  Pathology revealed a grade III 1.3 cm lesion with 2 of 16 lymph nodes positive.  Pathologic stage was T1cN1M0 lesion.  Tumor was ER+/PR+ and Her2/neu- .  She enrolled on NSABP B47 clinical trial.  She received 4 cycles of TC (shortened course due to adverse events) from 07/28/2013 to 10/27/2013.  She received breast radiation from 12/18/2013 to 02/06/2014.  In 2015, she took Femara (letrozole) x 1 month.  She was switched to Arimidex secondary to dysgeusia (metallic taste).  She is tolerating Arimidex  well.  Bilateral mammogram on 07/06/2016 revealed no evidence of malignancy within either breast.  There were stable postsurgical changes within the right breast.  CA27.29 has been followed: 14.7 on 02/21/2014, 12.4 on 05/09/2015, 10.6 on 10/07/2015, 18.1 on 02/10/2016, 15.0 on 08/12/2016, and 18.5 on 02/08/2017.  Bone density study on 11/15/2014 revealed osteopenia with a T-score of -1.2 in right femur.  She is on calcium and vitamin D.  She underwent right knee arthroplasty for osteoarthritis on 01/19/2017.  Clinically, she is recovering well from knee surgery.  She denies any complaints.  Exam is stable.  Plan: 1.  Labs today:  CBC with diff, CMP, CA27.29. 2.  Rx:  Arimidex 1 mg a day. 3.  Schedule bone density- next available. 4.  Anticipate next mammogram on 07/06/2017 (ordered by Dr. Bary Castilla). 5.  RTC in 6 months for MD assessment and labs (CBC with diff, CMP, CA27.29).    Lequita Asal, MD  02/08/2017, 11:55 AM

## 2017-02-09 LAB — CA 27.29 (SERIAL MONITOR): CA 27.29: 18.5 U/mL (ref 0.0–38.6)

## 2017-02-11 DIAGNOSIS — M25561 Pain in right knee: Secondary | ICD-10-CM | POA: Diagnosis not present

## 2017-02-16 DIAGNOSIS — M25561 Pain in right knee: Secondary | ICD-10-CM | POA: Diagnosis not present

## 2017-02-18 DIAGNOSIS — M25561 Pain in right knee: Secondary | ICD-10-CM | POA: Diagnosis not present

## 2017-02-23 DIAGNOSIS — M25561 Pain in right knee: Secondary | ICD-10-CM | POA: Diagnosis not present

## 2017-02-25 ENCOUNTER — Ambulatory Visit
Admission: RE | Admit: 2017-02-25 | Discharge: 2017-02-25 | Disposition: A | Discharge status: Home / Self Care | Payer: BLUE CROSS/BLUE SHIELD | Source: Ambulatory Visit | Attending: Radiation Oncology | Admitting: Radiation Oncology

## 2017-02-25 ENCOUNTER — Encounter: Payer: Self-pay | Admitting: Radiation Oncology

## 2017-02-25 VITALS — BP 147/90 | HR 90 | Temp 97.8°F | Resp 18 | Wt 292.0 lb

## 2017-02-25 DIAGNOSIS — C50311 Malignant neoplasm of lower-inner quadrant of right female breast: Secondary | ICD-10-CM | POA: Insufficient documentation

## 2017-02-25 DIAGNOSIS — Z79811 Long term (current) use of aromatase inhibitors: Secondary | ICD-10-CM | POA: Insufficient documentation

## 2017-02-25 DIAGNOSIS — Z923 Personal history of irradiation: Secondary | ICD-10-CM | POA: Insufficient documentation

## 2017-02-25 DIAGNOSIS — Z170 Estrogen receptor positive status [ER+]: Secondary | ICD-10-CM | POA: Diagnosis not present

## 2017-02-25 DIAGNOSIS — M25561 Pain in right knee: Secondary | ICD-10-CM | POA: Diagnosis not present

## 2017-02-25 NOTE — Progress Notes (Signed)
Radiation Oncology Follow up Note  Name: Tina Delgado   Date:   02/25/2017 MRN:  [id number] DOB: August 06, 1956    This 61 y.o. female presents to the clinic today for 3 year follow-up. Status post radiation therapy to her right breast for stage IIa invasive mammary carcinoma  REFERRING PROVIDER: Tonia Ghent, MD  HPI: Patient is a 61 year old female now out 3 years having completed whole breast radiation to her right breast for stage IIa (T1 cN1 a M0) invasive mammary carcinoma ER/PR positive HER-2/neu negative status post adjuvant chemotherapy wide local excision and sentinel node biopsy. She is seen today in routine follow-up and is doing well.. Her last mammograms were back in September 2017 and will BI-RADS 2 benign. She is currently on arimadex and tolerating that well without side effect. She specifically denies breast tenderness cough or bone pain.  COMPLICATIONS OF TREATMENT: none  FOLLOW UP COMPLIANCE: keeps appointments   PHYSICAL EXAM:  BP (!) 147/90   Pulse 90   Temp 97.8 F (36.6 C)   Resp 18   Wt 292 lb (132.5 kg)   BMI 48.59 kg/m  Lungs are clear to A&P cardiac examination essentially unremarkable with regular rate and rhythm. No dominant mass or nodularity is noted in either breast in 2 positions examined. Incision is well-healed. No axillary or supraclavicular adenopathy is appreciated. Cosmetic result is excellent. Well-developed well-nourished patient in NAD. HEENT reveals PERLA, EOMI, discs not visualized.  Oral cavity is clear. No oral mucosal lesions are identified. Neck is clear without evidence of cervical or supraclavicular adenopathy. Lungs are clear to A&P. Cardiac examination is essentially unremarkable with regular rate and rhythm without murmur rub or thrill. Abdomen is benign with no organomegaly or masses noted. Motor sensory and DTR levels are equal and symmetric in the upper and lower extremities. Cranial nerves II through XII are grossly intact.  Proprioception is intact. No peripheral adenopathy or edema is identified. No motor or sensory levels are noted. Crude visual fields are within normal range.  RADIOLOGY RESULTS: Previous mammograms are reviewed and compatible with the above-stated findings  PLAN: Present time she is doing well with no evidence of disease. I'm please were overall progress. I have asked to see her back in 1 year for follow-up. She continues on arimadex without side effect. Patient is to call with any concerns.  I would like to take this opportunity to thank you for allowing me to participate in the care of your patient.Armstead Peaks., MD

## 2017-03-01 DIAGNOSIS — M25561 Pain in right knee: Secondary | ICD-10-CM | POA: Diagnosis not present

## 2017-03-01 DIAGNOSIS — Z96651 Presence of right artificial knee joint: Secondary | ICD-10-CM | POA: Diagnosis not present

## 2017-03-16 ENCOUNTER — Ambulatory Visit
Admission: RE | Admit: 2017-03-16 | Discharge: 2017-03-16 | Disposition: A | Discharge status: Home / Self Care | Payer: BLUE CROSS/BLUE SHIELD | Source: Ambulatory Visit | Attending: Hematology and Oncology | Admitting: Hematology and Oncology

## 2017-03-16 DIAGNOSIS — M85851 Other specified disorders of bone density and structure, right thigh: Secondary | ICD-10-CM | POA: Insufficient documentation

## 2017-03-16 DIAGNOSIS — C50311 Malignant neoplasm of lower-inner quadrant of right female breast: Secondary | ICD-10-CM | POA: Diagnosis not present

## 2017-03-16 DIAGNOSIS — Z170 Estrogen receptor positive status [ER+]: Secondary | ICD-10-CM | POA: Diagnosis not present

## 2017-03-16 DIAGNOSIS — Z780 Asymptomatic menopausal state: Secondary | ICD-10-CM | POA: Insufficient documentation

## 2017-03-16 DIAGNOSIS — M8580 Other specified disorders of bone density and structure, unspecified site: Secondary | ICD-10-CM | POA: Diagnosis present

## 2017-03-17 ENCOUNTER — Telehealth: Payer: Self-pay | Admitting: *Deleted

## 2017-03-17 NOTE — Telephone Encounter (Signed)
Called patient to inform her that her bone scan showed slightly worsening osteopenia.  MD wants to know if patient would consider Prolia every 6 months.  Explained that patient would need dental clearance.  Asked patient to call back to let us know if she would be interested.

## 2017-03-17 NOTE — Telephone Encounter (Signed)
-----   Message from Lequita Asal, MD sent at 03/16/2017  4:19 PM EDT ----- Regarding: Please call patient  Bone density still with osteopenia, but slightly worse.  Consider Prolia every 6 months.  If interested, needs dental evaluation and preauth.  M  ----- Message ----- From: Interface, Rad Results In Sent: 03/16/2017   3:46 PM To: Lequita Asal, MD

## 2017-03-19 ENCOUNTER — Telehealth: Payer: Self-pay | Admitting: *Deleted

## 2017-03-19 NOTE — Telephone Encounter (Signed)
Patient called back to inform us that she is not interested in Prolia due to pre-existing jaw problems.  She states her dentist would never clear her to take it..  Informed her that I would notify MD and call back if MD has an alternate plan.

## 2017-04-01 ENCOUNTER — Other Ambulatory Visit: Payer: Self-pay | Admitting: Hematology and Oncology

## 2017-04-22 ENCOUNTER — Encounter: Payer: Self-pay | Admitting: Family Medicine

## 2017-04-22 LAB — GLUCOSE (CC13)
A1c: 6.1
ALT: 26
AST: 23
Cholesterol, Total: 229
Creatinine, Ser: 0.96
Glucose: 109
HDL Cholesterol: 42
Hemoglobin: 15
LDL Cholesterol (Calc): 155
TSH: 4.22
Triglycerides: 162

## 2017-05-03 DIAGNOSIS — Z96651 Presence of right artificial knee joint: Secondary | ICD-10-CM | POA: Diagnosis not present

## 2017-05-17 ENCOUNTER — Other Ambulatory Visit: Payer: Self-pay

## 2017-05-17 DIAGNOSIS — C50311 Malignant neoplasm of lower-inner quadrant of right female breast: Secondary | ICD-10-CM

## 2017-05-17 DIAGNOSIS — Z170 Estrogen receptor positive status [ER+]: Secondary | ICD-10-CM

## 2017-05-24 DIAGNOSIS — M75122 Complete rotator cuff tear or rupture of left shoulder, not specified as traumatic: Secondary | ICD-10-CM | POA: Diagnosis not present

## 2017-05-24 DIAGNOSIS — M19012 Primary osteoarthritis, left shoulder: Secondary | ICD-10-CM | POA: Diagnosis not present

## 2017-07-08 ENCOUNTER — Other Ambulatory Visit: Payer: BLUE CROSS/BLUE SHIELD

## 2017-07-09 ENCOUNTER — Ambulatory Visit
Admission: RE | Admit: 2017-07-09 | Discharge: 2017-07-09 | Disposition: A | Discharge status: Home / Self Care | Payer: BLUE CROSS/BLUE SHIELD | Source: Ambulatory Visit | Attending: General Surgery | Admitting: General Surgery

## 2017-07-09 DIAGNOSIS — C50311 Malignant neoplasm of lower-inner quadrant of right female breast: Secondary | ICD-10-CM

## 2017-07-09 DIAGNOSIS — Z170 Estrogen receptor positive status [ER+]: Secondary | ICD-10-CM

## 2017-07-09 DIAGNOSIS — R928 Other abnormal and inconclusive findings on diagnostic imaging of breast: Secondary | ICD-10-CM | POA: Diagnosis not present

## 2017-07-11 ENCOUNTER — Other Ambulatory Visit: Payer: Self-pay | Admitting: Family Medicine

## 2017-07-14 ENCOUNTER — Ambulatory Visit (INDEPENDENT_AMBULATORY_CARE_PROVIDER_SITE_OTHER): Payer: BLUE CROSS/BLUE SHIELD | Admitting: General Surgery

## 2017-07-14 ENCOUNTER — Encounter: Payer: Self-pay | Admitting: General Surgery

## 2017-07-14 VITALS — BP 130/70 | HR 59 | Resp 12 | Ht 65.0 in | Wt 293.0 lb

## 2017-07-14 DIAGNOSIS — C50311 Malignant neoplasm of lower-inner quadrant of right female breast: Secondary | ICD-10-CM

## 2017-07-14 DIAGNOSIS — Z170 Estrogen receptor positive status [ER+]: Secondary | ICD-10-CM | POA: Diagnosis not present

## 2017-07-14 NOTE — Progress Notes (Signed)
Patient ID: Tina Delgado, female   DOB: [birth year], 61 y.o.   MRN: [id number]  Chief Complaint  Patient presents with  . Follow-up    HPI Tina Delgado is a 61 y.o. female who presents for a breast evaluation. The most recent mammogram was done on 07/09/2017. [name]  Patient does perform regular self breast checks and gets regular mammograms done.  Right knee surgery went well in March. Bone density was in May,2018  HPI  Past Medical History:  Diagnosis Date  . Allergy   . Arthritis    s/p knee injection per ortho  . Breast cancer ([place]) 2014   RT LUMPECTOMY  . Dyspnea   . Elevated glucose 05/2003   106  . GERD (gastroesophageal reflux disease)   . Hyperlipidemia   . Hypertension   . Malignant neoplasm of upper-outer quadrant of female breast [facility]) July 20, 2013   histologic grade 3 invasive mammary carcinoma, 13 mm,  2/16 nodes positive, ER-positive, PR positive, HER-2/neu not amplified.T1c,N1a  . Mammographic microcalcification   . Morbid obesity ([place])   . Personal history of chemotherapy 2014   BREAST CA  . Personal history of radiation therapy 2014   BREAST CA  . Torn rotator cuff 12/2015   left    Past Surgical History:  Procedure Laterality Date  . BREAST EXCISIONAL BIOPSY Right 2014   positive  . BREAST SURGERY Right 07-20-13   Wide excision, SLN biopsy, axillary dissection.  . COLONOSCOPY  [id number]  . JOINT REPLACEMENT  07/30/2007   LTKR Dr Derrel Nip  . KNEE ARTHROSCOPY  03/04/2007   left Dr Derrel Nip  . PORTACATH PLACEMENT  2014  . s/p chemotherapy Right    right breast cancer   . s/p radiation therapy Right    right breast cancer  . TOTAL KNEE ARTHROPLASTY Right 01/19/2017   Procedure: TOTAL KNEE ARTHROPLASTY;  Surgeon: Corky Mull, MD;  Location: [facility] ORS;  Service: Orthopedics;  Laterality: Right;  . TUBAL LIGATION  ~1986   BTL    Family History  Problem Relation Age of Onset  . Heart disease Mother        MI  . Depression Mother         paranoia  . Hypertension Mother   . Stroke Mother   . Parkinsonism Mother   . Heart disease Brother        MI PTCA at 59  . Heart disease Father   . Stomach cancer Maternal Grandfather 80  . Breast cancer Maternal Aunt   . Breast cancer Cousin   . Colon cancer Paternal Uncle     Social History Social History  Substance Use Topics  . Smoking status: Never Smoker  . Smokeless tobacco: Never Used  . Alcohol use Yes     Comment: rare    Allergies  Allergen Reactions  . Latex Rash    More adhesive allergy than latex.  Has never been RAST tested. Thinks paper tape is okay.  . Lisinopril Cough  . Sulfonamide Derivatives Hives    REACTION: itching years ago    Current Outpatient Prescriptions  Medication Sig Dispense Refill  . acetaminophen (TYLENOL) 500 MG tablet Take 500 mg by mouth every 6 (six) hours as needed.    [name] amoxicillin (AMOXIL) 500 MG capsule Take 2,000 mg by mouth See admin instructions. Takes 1 hour prior to dental procdures    . anastrozole (ARIMIDEX) 1 MG tablet TAKE 1 TABLET (1 MG TOTAL) BY MOUTH DAILY. 90 tablet  0  . Black Cohosh 540 MG CAPS Take 540 mg by mouth every evening.    . Calcium Carb-Cholecalciferol (CALCIUM 600 + D PO) Take 1 tablet by mouth 2 (two) times daily.    Mariane Baumgarten Sodium (COLACE PO) Take 1 tablet by mouth as needed.    . fluticasone (FLONASE) 50 MCG/ACT nasal spray Place 1 spray into both nostrils daily as needed for allergies or rhinitis.     . metoprolol tartrate (LOPRESSOR) 100 MG tablet TAKE 1 TABLET (100 MG TOTAL) BY MOUTH 2 (TWO) TIMES DAILY. 180 tablet 0  . omeprazole (PRILOSEC) 20 MG capsule Take 20 mg by mouth daily.     No current facility-administered medications for this visit.     Review of Systems Review of Systems  Constitutional: Negative.   Respiratory: Negative.   Cardiovascular: Negative.     Blood pressure 130/70, pulse (!) 59, resp. rate 12, height 5' 5"  (1.651 m), weight 293 lb (132.9 kg).  Physical  Exam Physical Exam  Constitutional: She is oriented to person, place, and time. She appears well-developed and well-nourished.  Eyes: Conjunctivae are normal. No scleral icterus.  Neck: Neck supple.  Cardiovascular: Normal rate, regular rhythm and normal heart sounds.   Pulmonary/Chest: Effort normal and breath sounds normal. Right breast exhibits no inverted nipple, no mass, no nipple discharge, no skin change and no tenderness. Left breast exhibits no inverted nipple, no mass, no nipple discharge, no skin change and no tenderness. Breasts are asymmetrical.    Lymphadenopathy:    She has no cervical adenopathy.    She has no axillary adenopathy.  Neurological: She is alert and oriented to person, place, and time.  Skin: Skin is warm and dry.    Data Reviewed 03/16/2017 bone density: Normal for the spine, osteopenia for the femoral neck with mild progression over the last 2 years. He continues on calcium supplements. Bilateral diagnostic mammograms dated 07/09/2017 reviewed. No interval change. BI-RADS-2.  Assessment    Stable breast exam, mild volume loss over the last 12 months under the surgical site.    Plan        The patient has been asked to return to the office in one year with a bilateral diagnostic mammogram.The patient is aware to call back for any questions or concerns.   HPI, Physical Exam, Assessment and Plan have been scribed under the direction and in the presence of Hervey Ard, MD.  Gaspar Cola, CMA  I have completed the exam and reviewed the above documentation for accuracy and completeness.  I agree with the above.  Haematologist has been used and any errors in dictation or transcription are unintentional.  Hervey Ard, M.D., F.A.C.S.   Robert Bellow 07/16/2017, 8:32 PM

## 2017-07-14 NOTE — Patient Instructions (Signed)
The patient has been asked to return to the office in one year with a bilateral diagnostic mammogram.The patient is aware to call back for any questions or concerns. 

## 2017-08-11 NOTE — Progress Notes (Signed)
[facility] day:  08/12/17  Chief Complaint: Tina Delgado is a 61 y.o. female with a history of stage IIB left breast cancer who is seen for 6 month assessment.  HPI: The patient was last seen in the medical oncology clinic on 02/08/2017.  At that time, she was doing well.  She was status post right knee arthroplasty. She was undergoing physical therapy. She required the use of a cane for ambulation.  Exam was stable. CBC and CMP were both unremarkable. CA27.29 was 18.5. She continued on Arimidex with no perceived side effects. She was scheduled for preventative exams including bone density study and annual mammogram.  Bone density study was done on 03/16/2017 that demonstrated a T score of -1.4 (previously -1.2) in the right femoral neck consistent with osteopenia. She had a normal T score of 0.2 (previously 0.8) in her AP spine L1-L4.  She takes daily calcium and vitamin D supplements.   Bilateral diagnostic breast mammogram done on 07/09/2017 revealed no mammographic evidence of malignancy in either breast.  Symptomatically, patient is doing well.  She complains of LEFT shoulder pain. She has a known torn rotator cuff that she is being followed by Dr. Roland Rack.  She has no breast concerns. She denies any B symptoms or interval infections. Patient is eating with no significant weight loss.  Patient continues on Arimidex.   She performs monthly self breast examinations.    Past Medical History:  Diagnosis Date  . Allergy   . Arthritis    s/p knee injection per ortho  . Breast cancer ([place]) 2014   RT LUMPECTOMY  . Dyspnea   . Elevated glucose 05/2003   106  . GERD (gastroesophageal reflux disease)   . Hyperlipidemia   . Hypertension   . Malignant neoplasm of upper-outer quadrant of female breast [facility]) July 20, 2013   histologic grade 3 invasive mammary carcinoma, 13 mm,  2/16 nodes positive, ER-positive, PR positive, HER-2/neu not  amplified.T1c,N1a  . Mammographic microcalcification   . Morbid obesity ([place])   . Personal history of chemotherapy 2014   BREAST CA  . Personal history of radiation therapy 2014   BREAST CA  . Torn rotator cuff 12/2015   left    Past Surgical History:  Procedure Laterality Date  . BREAST EXCISIONAL BIOPSY Right 2014   positive  . BREAST SURGERY Right 07-20-13   Wide excision, SLN biopsy, axillary dissection.  . COLONOSCOPY  [id number]  . JOINT REPLACEMENT  07/30/2007   LTKR Dr Derrel Nip  . KNEE ARTHROSCOPY  03/04/2007   left Dr Derrel Nip  . PORTACATH PLACEMENT  2014  . s/p chemotherapy Right    right breast cancer   . s/p radiation therapy Right    right breast cancer  . TOTAL KNEE ARTHROPLASTY Right 01/19/2017   Procedure: TOTAL KNEE ARTHROPLASTY;  Surgeon: Corky Mull, MD;  Location: [facility] ORS;  Service: Orthopedics;  Laterality: Right;  . TUBAL LIGATION  ~1986   BTL    Family History  Problem Relation Age of Onset  . Heart disease Mother        MI  . Depression Mother        paranoia  . Hypertension Mother   . Stroke Mother   . Parkinsonism Mother   . Heart disease Brother        MI PTCA at 65  . Heart disease Father   . Stomach cancer Maternal Grandfather 57  . Breast cancer  Maternal Aunt   . Breast cancer Cousin   . Colon cancer Paternal Uncle     Social History:  reports that she has never smoked. She has never used smokeless tobacco. She reports that she drinks alcohol. She reports that she does not use drugs.  She lives in [place].  The patient is alone today.  Allergies:  Allergies  Allergen Reactions  . Latex Rash    More adhesive allergy than latex.  Has never been RAST tested. Thinks paper tape is okay.  . Lisinopril Cough  . Sulfonamide Derivatives Hives    REACTION: itching years ago    Current Medications: Current Outpatient Prescriptions  Medication Sig Dispense Refill  . acetaminophen (TYLENOL) 500 MG tablet Take 500 mg by mouth every 6  (six) hours as needed.    [name] amoxicillin (AMOXIL) 500 MG capsule Take 2,000 mg by mouth See admin instructions. Takes 1 hour prior to dental procdures    . anastrozole (ARIMIDEX) 1 MG tablet TAKE 1 TABLET (1 MG TOTAL) BY MOUTH DAILY. 90 tablet 0  . Black Cohosh 540 MG CAPS Take 540 mg by mouth every evening.    . Calcium Carb-Cholecalciferol (CALCIUM 600 + D PO) Take 1 tablet by mouth 2 (two) times daily.    Mariane Baumgarten Sodium (COLACE PO) Take 1 tablet by mouth as needed.    . fluticasone (FLONASE) 50 MCG/ACT nasal spray Place 1 spray into both nostrils daily as needed for allergies or rhinitis.     . metoprolol tartrate (LOPRESSOR) 100 MG tablet TAKE 1 TABLET (100 MG TOTAL) BY MOUTH 2 (TWO) TIMES DAILY. 180 tablet 0  . omeprazole (PRILOSEC) 20 MG capsule Take 20 mg by mouth daily.     No current facility-administered medications for this visit.     Review of Systems:  GENERAL:  Feels "well".  No fevers or sweats.  Weight up 3 pounds since 02/08/2017.  PERFORMANCE STATUS (ECOG):  1 HEENT:  No visual changes, runny nose, sore throat, mouth sores or tenderness. Lungs: No shortness of breath or cough.  No hemoptysis. Cardiac:  No chest pain, palpitations, orthopnea, or PND. GI:  No nausea, vomiting, diarrhea, constipation, melena or hematochezia. GU:  No urgency, frequency, dysuria, or hematuria. Musculoskeletal:  Arthritis.  Left rotator cuff tear.  s/p right knee arthroplasty.  No back pain.  No muscle tenderness. Extremities:  No pain or swelling. Skin:  No rashes or skin changes. Neuro:  No headache, numbness or weakness, balance or coordination issues. Endocrine:  No diabetes, thyroid issues, hot flashes or night sweats. Psych:  No mood changes, depression or anxiety. Pain:  No focal pain. Review of systems:  All other systems reviewed and found to be negative.  Physical Exam: Blood pressure 127/75, pulse 60, temperature 98.2 F (36.8 C), temperature source Tympanic, weight 291 lb  (132 kg). GENERAL:  Well developed, well nourished, heavy set woman sitting comfortably in the exam room in no acute distress. MENTAL STATUS:  Alert and oriented to person, place and time. HEAD:  Short gray hair.  Normocephalic, atraumatic, face symmetric, no Cushingoid features. EYES:  Blue eyes.  Pupils equal round and reactive to light and accomodation.  No conjunctivitis or scleral icterus. ENT:  Oropharynx clear without lesion.  Tongue normal. Mucous membranes moist.  RESPIRATORY:  Clear to auscultation without rales, wheezes or rhonchi. CARDIOVASCULAR:  Regular rate and rhythm without murmur, rub or gallop. BREAST:  Right breast with well healed incision at the 3 o'clock position.  Mild  post radiation changes.  Left breast with minimal fibrocystic changes in the lower inner quadrant.  No masses, skin changes or nipple discharge.  ABDOMEN:  Soft, non-tender, with active bowel sounds, and no hepatosplenomegaly.  No masses. SKIN:  No rashes, ulcers or lesions. EXTREMITIES: No edema, no skin discoloration or tenderness.  No palpable cords. LYMPH NODES: No palpable cervical, supraclavicular, axillary or inguinal adenopathy  NEUROLOGICAL: Unremarkable. PSYCH:  Appropriate.    Appointment on 08/12/2017  Component Date Value Ref Range Status  . Sodium 08/12/2017 137  135 - 145 mmol/L Final  . Potassium 08/12/2017 4.2  3.5 - 5.1 mmol/L Final  . Chloride 08/12/2017 101  101 - 111 mmol/L Final  . CO2 08/12/2017 26  22 - 32 mmol/L Final  . Glucose, Bld 08/12/2017 121* 65 - 99 mg/dL Final  . BUN 08/12/2017 11  6 - 20 mg/dL Final  . Creatinine, Ser 08/12/2017 0.89  0.44 - 1.00 mg/dL Final  . Calcium 08/12/2017 9.4  8.9 - 10.3 mg/dL Final  . Total Protein 08/12/2017 7.1  6.5 - 8.1 g/dL Final  . Albumin 08/12/2017 3.6  3.5 - 5.0 g/dL Final  . AST 08/12/2017 28  15 - 41 U/L Final  . ALT 08/12/2017 30  14 - 54 U/L Final  . Alkaline Phosphatase 08/12/2017 83  38 - 126 U/L Final  . Total Bilirubin  08/12/2017 0.6  0.3 - 1.2 mg/dL Final  . GFR calc non Af Amer 08/12/2017 >60  >60 mL/min Final  . GFR calc Af Amer 08/12/2017 >60  >60 mL/min Final   Comment: (NOTE) The eGFR has been calculated using the CKD EPI equation. This calculation has not been validated in all clinical situations. eGFR's persistently <60 mL/min signify possible Chronic Kidney Disease.   . Anion gap 08/12/2017 10  5 - 15 Final  . WBC 08/12/2017 8.6  3.6 - 11.0 K/uL Final  . RBC 08/12/2017 5.03  3.80 - 5.20 MIL/uL Final  . Hemoglobin 08/12/2017 14.9  12.0 - 16.0 g/dL Final  . HCT 08/12/2017 44.1  35.0 - 47.0 % Final  . MCV 08/12/2017 87.8  80.0 - 100.0 fL Final  . MCH 08/12/2017 29.6  26.0 - 34.0 pg Final  . MCHC 08/12/2017 33.7  32.0 - 36.0 g/dL Final  . RDW 08/12/2017 14.4  11.5 - 14.5 % Final  . Platelets 08/12/2017 231  150 - 440 K/uL Final  . Neutrophils Relative % 08/12/2017 68  % Final  . Neutro Abs 08/12/2017 5.9  1.4 - 6.5 K/uL Final  . Lymphocytes Relative 08/12/2017 21  % Final  . Lymphs Abs 08/12/2017 1.8  1.0 - 3.6 K/uL Final  . Monocytes Relative 08/12/2017 7  % Final  . Monocytes Absolute 08/12/2017 0.6  0.2 - 0.9 K/uL Final  . Eosinophils Relative 08/12/2017 3  % Final  . Eosinophils Absolute 08/12/2017 0.2  0 - 0.7 K/uL Final  . Basophils Relative 08/12/2017 1  % Final  . Basophils Absolute 08/12/2017 0.1  0 - 0.1 K/uL Final    Assessment:  Petrina Melby is a 61 y.o. female with a history of stage IIB right breast cancer s/p lumpectomy on 05/2013.  Pathology revealed a grade III 1.3 cm lesion with 2 of 16 lymph nodes positive.  Pathologic stage was T1cN1M0 lesion.  Tumor was ER+/PR+ and Her2/neu- .  She enrolled on NSABP B47 clinical trial.  She received 4 cycles of TC (shortened course due to adverse events) from 07/28/2013 to 10/27/2013.  She received breast radiation from 12/18/2013 to 02/06/2014.  In 2015, she took Femara (letrozole) x 1 month.  She was switched to Arimidex secondary  to dysgeusia (metallic taste).  She is tolerating Arimidex well.  Bilateral mammogram on  07/09/2017 revealed no evidence of malignancy within either breast.  There were stable postsurgical changes within the right breast.  CA27.29 has been followed: 14.7 on 02/21/2014, 12.4 on 05/09/2015, 10.6 on 10/07/2015, 18.1 on 02/10/2016, 15.0 on 08/12/2016, 18.5 on 02/08/2017, and 17.4 on 08/12/2017.  Bone density study on 11/15/2014 revealed osteopenia with a T-score of -1.2 in right femur.  T-score of -1.4 in the right femur was noted on 03/16/2017 study.  She is on calcium and vitamin D.  She underwent right knee arthroplasty for osteoarthritis on 01/19/2017.  Symptomatically, she is doing well. She complains of LEFT shoulder pain in the setting of a known rotator cuff. There are no breast concerns. Exam is stable.   Plan: 1.  Labs today:  CBC with diff, CMP, CA27.29. 2.  Review mammogram - no evidence of malignancy. 3.  Review bone density. Patient with a T-score of -1.4 in the right femoral neck, which is consistent with osteopenia. Encouraged to continue calcium 1241m and vitamin D 800 IU daily. Ten-year fracture probability by FRAX of 3% for hip fracture or 20% or greater for major osteoporotic fracture. Discuss Prolia injections to prevent further bone thinning. Patient will require dental clearance prior to starting this medication. Patient was seen by her dentist, Dr. DOlena Heckle last week for a dental crown. Printed information provided today on Prolia for patient to review. 4.  Continue Arimidex 1 mg daily. 5.  RTC in 6 months for MD assessment and labs (CBC with diff, CMP, CA27.29).    BHonor Loh NP  08/12/2017, 11:56 AM   I saw and evaluated the patient, participating in the key portions of the service and reviewing pertinent diagnostic studies and records.  I reviewed the nurse practitioner's note and agree with the findings and the plan.  A few questions were asked by the patient and  answered.   MNolon Stalls MD 08/12/2017,11:56 AM

## 2017-08-12 ENCOUNTER — Inpatient Hospital Stay: Payer: BLUE CROSS/BLUE SHIELD | Attending: Hematology and Oncology

## 2017-08-12 ENCOUNTER — Inpatient Hospital Stay (HOSPITAL_BASED_OUTPATIENT_CLINIC_OR_DEPARTMENT_OTHER): Payer: BLUE CROSS/BLUE SHIELD | Admitting: Hematology and Oncology

## 2017-08-12 ENCOUNTER — Encounter: Payer: Self-pay | Admitting: Hematology and Oncology

## 2017-08-12 VITALS — BP 127/75 | HR 60 | Temp 98.2°F | Wt 291.0 lb

## 2017-08-12 DIAGNOSIS — Z79811 Long term (current) use of aromatase inhibitors: Secondary | ICD-10-CM | POA: Diagnosis not present

## 2017-08-12 DIAGNOSIS — Z79899 Other long term (current) drug therapy: Secondary | ICD-10-CM

## 2017-08-12 DIAGNOSIS — Z882 Allergy status to sulfonamides status: Secondary | ICD-10-CM | POA: Insufficient documentation

## 2017-08-12 DIAGNOSIS — M8589 Other specified disorders of bone density and structure, multiple sites: Secondary | ICD-10-CM

## 2017-08-12 DIAGNOSIS — C50311 Malignant neoplasm of lower-inner quadrant of right female breast: Secondary | ICD-10-CM

## 2017-08-12 DIAGNOSIS — C50411 Malignant neoplasm of upper-outer quadrant of right female breast: Secondary | ICD-10-CM

## 2017-08-12 DIAGNOSIS — Z9221 Personal history of antineoplastic chemotherapy: Secondary | ICD-10-CM

## 2017-08-12 DIAGNOSIS — E785 Hyperlipidemia, unspecified: Secondary | ICD-10-CM

## 2017-08-12 DIAGNOSIS — C779 Secondary and unspecified malignant neoplasm of lymph node, unspecified: Secondary | ICD-10-CM

## 2017-08-12 DIAGNOSIS — M8580 Other specified disorders of bone density and structure, unspecified site: Secondary | ICD-10-CM

## 2017-08-12 DIAGNOSIS — M1990 Unspecified osteoarthritis, unspecified site: Secondary | ICD-10-CM

## 2017-08-12 DIAGNOSIS — Z800 Family history of malignant neoplasm of digestive organs: Secondary | ICD-10-CM | POA: Diagnosis not present

## 2017-08-12 DIAGNOSIS — Z803 Family history of malignant neoplasm of breast: Secondary | ICD-10-CM | POA: Insufficient documentation

## 2017-08-12 DIAGNOSIS — Z170 Estrogen receptor positive status [ER+]: Secondary | ICD-10-CM

## 2017-08-12 DIAGNOSIS — Z923 Personal history of irradiation: Secondary | ICD-10-CM

## 2017-08-12 DIAGNOSIS — M25512 Pain in left shoulder: Secondary | ICD-10-CM | POA: Diagnosis not present

## 2017-08-12 DIAGNOSIS — I10 Essential (primary) hypertension: Secondary | ICD-10-CM | POA: Insufficient documentation

## 2017-08-12 DIAGNOSIS — M85851 Other specified disorders of bone density and structure, right thigh: Secondary | ICD-10-CM | POA: Insufficient documentation

## 2017-08-12 DIAGNOSIS — K219 Gastro-esophageal reflux disease without esophagitis: Secondary | ICD-10-CM

## 2017-08-12 DIAGNOSIS — M85859 Other specified disorders of bone density and structure, unspecified thigh: Secondary | ICD-10-CM | POA: Insufficient documentation

## 2017-08-12 LAB — CBC WITH DIFFERENTIAL/PLATELET
Basophils Absolute: 0.1 10*3/uL (ref 0–0.1)
Basophils Relative: 1 %
Eosinophils Absolute: 0.2 10*3/uL (ref 0–0.7)
Eosinophils Relative: 3 %
HCT: 44.1 % (ref 35.0–47.0)
Hemoglobin: 14.9 g/dL (ref 12.0–16.0)
Lymphocytes Relative: 21 %
Lymphs Abs: 1.8 10*3/uL (ref 1.0–3.6)
MCH: 29.6 pg (ref 26.0–34.0)
MCHC: 33.7 g/dL (ref 32.0–36.0)
MCV: 87.8 fL (ref 80.0–100.0)
Monocytes Absolute: 0.6 10*3/uL (ref 0.2–0.9)
Monocytes Relative: 7 %
Neutro Abs: 5.9 10*3/uL (ref 1.4–6.5)
Neutrophils Relative %: 68 %
Platelets: 231 10*3/uL (ref 150–440)
RBC: 5.03 MIL/uL (ref 3.80–5.20)
RDW: 14.4 % (ref 11.5–14.5)
WBC: 8.6 10*3/uL (ref 3.6–11.0)

## 2017-08-12 LAB — COMPREHENSIVE METABOLIC PANEL
ALT: 30 U/L (ref 14–54)
AST: 28 U/L (ref 15–41)
Albumin: 3.6 g/dL (ref 3.5–5.0)
Alkaline Phosphatase: 83 U/L (ref 38–126)
Anion gap: 10 (ref 5–15)
BUN: 11 mg/dL (ref 6–20)
CO2: 26 mmol/L (ref 22–32)
Calcium: 9.4 mg/dL (ref 8.9–10.3)
Chloride: 101 mmol/L (ref 101–111)
Creatinine, Ser: 0.89 mg/dL (ref 0.44–1.00)
GFR calc Af Amer: >60 mL/min (ref >60)
GFR calc non Af Amer: >60 mL/min (ref >60)
Glucose, Bld: 121 mg/dL — ABNORMAL HIGH (ref 65–99)
Potassium: 4.2 mmol/L (ref 3.5–5.1)
Sodium: 137 mmol/L (ref 135–145)
Total Bilirubin: 0.6 mg/dL (ref 0.3–1.2)
Total Protein: 7.1 g/dL (ref 6.5–8.1)

## 2017-08-12 NOTE — Progress Notes (Signed)
Patient is here for follow up with labs. She is having some left shoulder pain due to a rotator cuff injury. She is planning to have surgery soon. She states that she will get her Flu Vaccine next week.

## 2017-08-12 NOTE — Patient Instructions (Signed)

## 2017-08-13 ENCOUNTER — Other Ambulatory Visit: Payer: Self-pay | Admitting: Hematology and Oncology

## 2017-08-13 LAB — CANCER ANTIGEN 27.29: CA 27.29: 17.4 U/mL (ref 0.0–38.6)

## 2017-08-19 ENCOUNTER — Encounter: Payer: Self-pay | Admitting: Family Medicine

## 2017-08-19 ENCOUNTER — Ambulatory Visit (INDEPENDENT_AMBULATORY_CARE_PROVIDER_SITE_OTHER): Payer: BLUE CROSS/BLUE SHIELD | Admitting: Family Medicine

## 2017-08-19 VITALS — BP 128/78 | HR 67 | Temp 98.6°F | Ht 65.0 in | Wt 297.2 lb

## 2017-08-19 DIAGNOSIS — M85851 Other specified disorders of bone density and structure, right thigh: Secondary | ICD-10-CM

## 2017-08-19 DIAGNOSIS — M8580 Other specified disorders of bone density and structure, unspecified site: Secondary | ICD-10-CM | POA: Diagnosis not present

## 2017-08-19 DIAGNOSIS — Z170 Estrogen receptor positive status [ER+]: Secondary | ICD-10-CM

## 2017-08-19 DIAGNOSIS — Z0000 Encounter for general adult medical examination without abnormal findings: Secondary | ICD-10-CM | POA: Diagnosis not present

## 2017-08-19 DIAGNOSIS — Z7189 Other specified counseling: Secondary | ICD-10-CM

## 2017-08-19 DIAGNOSIS — E785 Hyperlipidemia, unspecified: Secondary | ICD-10-CM

## 2017-08-19 DIAGNOSIS — K219 Gastro-esophageal reflux disease without esophagitis: Secondary | ICD-10-CM

## 2017-08-19 DIAGNOSIS — I10 Essential (primary) hypertension: Secondary | ICD-10-CM

## 2017-08-19 DIAGNOSIS — C50311 Malignant neoplasm of lower-inner quadrant of right female breast: Secondary | ICD-10-CM

## 2017-08-19 LAB — VITAMIN D 25 HYDROXY (VIT D DEFICIENCY, FRACTURES): VITD: 26.27 ng/mL — ABNORMAL LOW (ref 30.00–100.00)

## 2017-08-19 MED ORDER — METOPROLOL TARTRATE 100 MG PO TABS: 100.0000 mg | ORAL_TABLET | Freq: Two times a day (BID) | ORAL | 3 refills | Status: DC

## 2017-08-19 MED ORDER — OMEPRAZOLE 20 MG PO CPDR: 20.0000 mg | DELAYED_RELEASE_CAPSULE | Freq: Every day | ORAL | 3 refills | Status: DC

## 2017-08-19 MED ORDER — PRAVASTATIN SODIUM 20 MG PO TABS: 20.0000 mg | ORAL_TABLET | Freq: Every day | ORAL | 3 refills | Status: DC

## 2017-08-19 NOTE — Patient Instructions (Addendum)
Check with your insurance to see if they will cover the shingrix shot. Try pravastatin daily.  If aches, then cut back to 1/2 tab a day.  If still with aches, then stop it and update me.  Go to the lab on the way out.  We'll contact you with your lab report. Take care.  Glad to see you.

## 2017-08-19 NOTE — Progress Notes (Signed)
CPE- See plan.  Routine anticipatory guidance given to patient.  See health maintenance.  The possibility exists that previously documented standard health maintenance information may have been brought forward from a previous encounter into this note.  If needed, that same information has been updated to reflect the current situation based on today's encounter.    Tetanus 2013 PNA due at 63.  Shingles shot d/w pt.  Flu shot 08/17/17.   Colonoscopy 2015 Mammogram comes through cancer and surgery clinic.  Pap 2015, d/w pt about q 5 year screening.   DXA- no prev tx.  The probability of a major osteoporotic fracture is 11.7% within the next ten years. The probability of a hip fracture is 0.9% within the next ten years. Diet and exercise d/w pt. Encouraged. She'll wants to get back to the gym, encouraged. She had her knee surgery earlier this year.   Living will d/w pt. Would have daughter Estill Bamberg designated if she were incapacitated. HCV and HIV screening d/w pt. [facility], would have screening done then, ~1990s.   Hypertension:    Using medication without problems or lightheadedness: yes Chest pain with exertion:no Edema:rare, limited, self resolved.   Short of breath: some with exertion- this may be due to deconditioning.    HLD.  She had aches on simvastatin.  D/w pt.    H/o GERD on PPI.  D/w pt about options re: DXA and bisphosphonate vs prolia.    She has f/u pending with ortho about her L shoulder/rotator cuff.    Breast cancer per onc on anastrazole.  onc wanted patient to consider prolia. She talked with Dr. Olena Heckle about it with the dental clinic.  Per patient, there was no contraindication at this point from a dental standpoint.    PMH and SH reviewed  Meds, vitals, and allergies reviewed.   ROS: Per HPI.  Unless specifically indicated otherwise in HPI, the patient denies:  General: fever. Eyes: acute vision changes ENT: sore  throat Cardiovascular: chest pain Respiratory: SOB GI: vomiting GU: dysuria Musculoskeletal: acute back pain Derm: acute rash Neuro: acute motor dysfunction Psych: worsening mood Endocrine: polydipsia Heme: bleeding Allergy: hayfever  GEN: nad, alert and oriented HEENT: mucous membranes moist NECK: supple w/o LA CV: rrr. PULM: ctab, no inc wob ABD: soft, +bs EXT: no edema SKIN: no acute rash

## 2017-08-20 DIAGNOSIS — K219 Gastro-esophageal reflux disease without esophagitis: Secondary | ICD-10-CM | POA: Insufficient documentation

## 2017-08-20 NOTE — Assessment & Plan Note (Signed)
Discussed with patient about weight reduction and PPI use. Goal is to limit PPI use is much as possible. She agrees.

## 2017-08-20 NOTE — Assessment & Plan Note (Signed)
iving will d/w pt. Would have daughter Estill Bamberg designated if she were incapacitated.

## 2017-08-20 NOTE — Assessment & Plan Note (Signed)
She had aches on simvastatin. Reasonable to try pravastatin. Routine cautions discussed with patient. See after visit summary.

## 2017-08-20 NOTE — Assessment & Plan Note (Signed)
Per surgery and oncology 

## 2017-08-20 NOTE — Assessment & Plan Note (Signed)
Tetanus 2013 PNA due at 63.  Shingles shot d/w pt.  Flu shot 08/17/17.   Colonoscopy 2015 Mammogram comes through cancer and surgery clinic.  Pap 2015, d/w pt about q 5 year screening.   DXA- no prev tx.  The probability of a major osteoporotic fracture is 11.7% within the next ten years. The probability of a hip fracture is 0.9% within the next ten years. Diet and exercise d/w pt. Encouraged. She'll wants to get back to the gym, encouraged. She had her knee surgery earlier this year.   Living will d/w pt. Would have daughter Tina Delgado designated if she were incapacitated. HCV and HIV screening d/w pt. [facility], would have screening done then, ~1990s.

## 2017-08-20 NOTE — Assessment & Plan Note (Signed)
Discussed with patient about options. Routine cautions with her Lyrica discussed with patient. Reasonable to check vitamin D to make sure this is adequate first. Based on her for FRAX score, she does not have severely elevated risk but it is worth considering treatment. If she did not start prolia, another option would be to recheck her bone density test in about 2 years and make sure vitamin D was adequate in the meantime.

## 2017-08-20 NOTE — Assessment & Plan Note (Signed)
Controlled. Needs weight reduction. Discussed with patient about diet and exercise options. No change in meds.

## 2017-08-23 ENCOUNTER — Other Ambulatory Visit: Payer: Self-pay | Admitting: Family Medicine

## 2017-08-23 DIAGNOSIS — M75122 Complete rotator cuff tear or rupture of left shoulder, not specified as traumatic: Secondary | ICD-10-CM | POA: Diagnosis not present

## 2017-08-23 DIAGNOSIS — M19012 Primary osteoarthritis, left shoulder: Secondary | ICD-10-CM | POA: Diagnosis not present

## 2017-08-23 DIAGNOSIS — R7989 Other specified abnormal findings of blood chemistry: Secondary | ICD-10-CM

## 2017-08-23 MED ORDER — VITAMIN D 1000 UNITS PO TABS: 2000.0000 [IU] | ORAL_TABLET | Freq: Every day | ORAL | Status: DC

## 2017-08-24 ENCOUNTER — Other Ambulatory Visit: Payer: Self-pay | Admitting: Surgery

## 2017-08-24 DIAGNOSIS — M75122 Complete rotator cuff tear or rupture of left shoulder, not specified as traumatic: Secondary | ICD-10-CM

## 2017-08-27 ENCOUNTER — Ambulatory Visit: Payer: Self-pay

## 2017-08-27 NOTE — Telephone Encounter (Signed)
Called pt regarding obtaining OTC vit D 1000IU tab and take 2 tabs (2000 IU) a day. Recheck vit D in about 3 month. Per pt did not want to make appointment for lab check. States she is getting her cholesterol checked at work and stated that her work may be able to check. Informed pt it need to be checked in 3 months. Pt verbalized understanding.

## 2017-09-01 DIAGNOSIS — M19012 Primary osteoarthritis, left shoulder: Secondary | ICD-10-CM | POA: Diagnosis not present

## 2017-09-01 DIAGNOSIS — M67912 Unspecified disorder of synovium and tendon, left shoulder: Secondary | ICD-10-CM | POA: Diagnosis not present

## 2017-09-01 DIAGNOSIS — M24312 Pathological dislocation of left shoulder, not elsewhere classified: Secondary | ICD-10-CM | POA: Diagnosis not present

## 2017-09-15 DIAGNOSIS — M75122 Complete rotator cuff tear or rupture of left shoulder, not specified as traumatic: Secondary | ICD-10-CM | POA: Diagnosis not present

## 2017-09-15 DIAGNOSIS — M19012 Primary osteoarthritis, left shoulder: Secondary | ICD-10-CM | POA: Diagnosis not present

## 2017-09-21 ENCOUNTER — Inpatient Hospital Stay
Admission: RE | Admit: 2017-09-21 | Discharge: 2017-09-21 | Disposition: A | Discharge status: Home / Self Care | Payer: BLUE CROSS/BLUE SHIELD | Source: Ambulatory Visit

## 2017-09-27 DIAGNOSIS — M25512 Pain in left shoulder: Secondary | ICD-10-CM | POA: Diagnosis not present

## 2017-09-27 DIAGNOSIS — M19012 Primary osteoarthritis, left shoulder: Secondary | ICD-10-CM | POA: Diagnosis not present

## 2017-09-30 ENCOUNTER — Encounter: Admission: RE | Payer: Self-pay | Source: Ambulatory Visit

## 2017-09-30 ENCOUNTER — Ambulatory Visit: Admission: RE | Admit: 2017-09-30 | Payer: BLUE CROSS/BLUE SHIELD | Source: Ambulatory Visit | Admitting: Surgery

## 2017-09-30 SURGERY — ARTHROSCOPY, SHOULDER WITH REPAIR, ROTATOR CUFF, OPEN
Anesthesia: Choice | Laterality: Left

## 2017-10-07 DIAGNOSIS — M25512 Pain in left shoulder: Secondary | ICD-10-CM | POA: Diagnosis not present

## 2017-10-07 DIAGNOSIS — M19012 Primary osteoarthritis, left shoulder: Secondary | ICD-10-CM | POA: Diagnosis not present

## 2017-10-07 DIAGNOSIS — Z853 Personal history of malignant neoplasm of breast: Secondary | ICD-10-CM | POA: Diagnosis not present

## 2017-10-12 DIAGNOSIS — H43811 Vitreous degeneration, right eye: Secondary | ICD-10-CM | POA: Diagnosis not present

## 2017-10-12 DIAGNOSIS — H43391 Other vitreous opacities, right eye: Secondary | ICD-10-CM | POA: Diagnosis not present

## 2017-10-12 DIAGNOSIS — H3561 Retinal hemorrhage, right eye: Secondary | ICD-10-CM | POA: Diagnosis not present

## 2017-10-12 DIAGNOSIS — H4311 Vitreous hemorrhage, right eye: Secondary | ICD-10-CM | POA: Diagnosis not present

## 2017-10-12 DIAGNOSIS — H10011 Acute follicular conjunctivitis, right eye: Secondary | ICD-10-CM | POA: Diagnosis not present

## 2017-10-12 DIAGNOSIS — H33311 Horseshoe tear of retina without detachment, right eye: Secondary | ICD-10-CM | POA: Diagnosis not present

## 2017-10-26 HISTORY — PX: TOTAL SHOULDER ARTHROPLASTY: SHX126

## 2017-10-29 DIAGNOSIS — H33311 Horseshoe tear of retina without detachment, right eye: Secondary | ICD-10-CM | POA: Diagnosis not present

## 2017-11-14 ENCOUNTER — Other Ambulatory Visit: Payer: Self-pay | Admitting: Hematology and Oncology

## 2017-11-19 ENCOUNTER — Ambulatory Visit: Payer: BLUE CROSS/BLUE SHIELD | Admitting: Family Medicine

## 2017-11-19 DIAGNOSIS — M19012 Primary osteoarthritis, left shoulder: Secondary | ICD-10-CM | POA: Diagnosis not present

## 2017-11-24 ENCOUNTER — Encounter: Payer: Self-pay | Admitting: Family Medicine

## 2017-11-24 ENCOUNTER — Encounter: Payer: Self-pay | Admitting: General Surgery

## 2017-11-24 ENCOUNTER — Ambulatory Visit (INDEPENDENT_AMBULATORY_CARE_PROVIDER_SITE_OTHER): Payer: BLUE CROSS/BLUE SHIELD | Admitting: Family Medicine

## 2017-11-24 VITALS — BP 146/94 | HR 74 | Temp 98.9°F | Wt 280.0 lb

## 2017-11-24 DIAGNOSIS — Z1159 Encounter for screening for other viral diseases: Secondary | ICD-10-CM

## 2017-11-24 DIAGNOSIS — R739 Hyperglycemia, unspecified: Secondary | ICD-10-CM | POA: Diagnosis not present

## 2017-11-24 DIAGNOSIS — Z119 Encounter for screening for infectious and parasitic diseases, unspecified: Secondary | ICD-10-CM | POA: Diagnosis not present

## 2017-11-24 DIAGNOSIS — R7989 Other specified abnormal findings of blood chemistry: Secondary | ICD-10-CM

## 2017-11-24 LAB — VITAMIN D 25 HYDROXY (VIT D DEFICIENCY, FRACTURES): VITD: 35.9 ng/mL (ref 30.00–100.00)

## 2017-11-24 MED ORDER — VITAMIN D 1000 UNITS PO TABS: 2000.0000 [IU] | ORAL_TABLET | Freq: Every day | ORAL | Status: DC

## 2017-11-24 MED ORDER — PRAVASTATIN SODIUM 20 MG PO TABS: 20.0000 mg | ORAL_TABLET | Freq: Every evening | ORAL | Status: DC

## 2017-11-24 NOTE — Progress Notes (Signed)
She lost most of her upper R canine today when it broke off.  She has f/u with dental clinic pending.  I'll defer, d/w pt.  She agrees.  No tooth pain.    I had talked with Dr. Camelia Phenes.  Hematology typically starts patients on Prolia when they have osteopenia and are on an aromatase inhibitor. Since pt's bone density is slightly worse than 2 years ago, Dr. Mike Gip offered Prolia.  Vit d slightly low prev, reasonable to get this back to normal and then f/u with Dr. Mike Gip RP:[id number]. her dental concerns may play a role in prolia start.  She isn't on prolia currently.  She has f/u with Dr. Mike Gip in 01/2018.  All d/w pt.  See notes on labs.    She is able to tolerate pravastatin.  Defer recheck lipid today since it likely wouldn't change mgmt, she agrees.    A1c 6.2 on recent outside labs.  D/w pt about labs and diet/exercise/weight.    She has seen ortho about her L shoulder, referred to [facility], and still having some pain with ROM and can't fully elevate the L arm.  She has surgery pending for 12/22/17.  She'll have her pre op eval per surgery.  She isn't diabetic.  No CP, not SOB.  No h/o cva, no h/o MI.  Still on metoprolol.    The week prior to xmas she went to the eye clinic with retinal tear and and laser surgery in [place].    See avs.   Meds, vitals, and allergies reviewed.   ROS: Per HPI unless specifically indicated in ROS section   GEN: nad, alert and oriented HEENT: mucous membranes moist NECK: supple w/o LA CV: rrr.  PULM: ctab, no inc wob ABD: soft, +bs EXT: no edema SKIN: no acute rash R upper canine broken.   L arm with pain on ROM, dec ROM noted- can't fully abduct the arm.   Recheck BP 142/90

## 2017-11-24 NOTE — Patient Instructions (Addendum)
If the surgery team needs my input, then they should contact me.   Go to the lab on the way out.  We'll contact you with your lab report. Take care.  Glad to see you.  Update me as needed.  If your BP is staying consistently >140/>90 then let me know.  Recheck labs in the fall at a physical, sooner if needed.

## 2017-11-24 NOTE — Addendum Note (Signed)
Addended by: Pilar Grammes on: 11/24/2017 02:59 PM   Modules accepted: Orders

## 2017-11-25 DIAGNOSIS — R7989 Other specified abnormal findings of blood chemistry: Secondary | ICD-10-CM | POA: Insufficient documentation

## 2017-11-25 LAB — HEPATITIS C ANTIBODY
Hepatitis C Ab: NONREACTIVE
SIGNAL TO CUT-OFF: 0.02 (ref <1.00)

## 2017-11-25 LAB — HIV ANTIBODY (ROUTINE TESTING W REFLEX): HIV 1&2 Ab, 4th Generation: NONREACTIVE

## 2017-11-25 NOTE — Assessment & Plan Note (Signed)
A1c similar to prev.  Continue to work on diet and exercise and recheck labs periodically.  She agrees.

## 2017-11-25 NOTE — Assessment & Plan Note (Signed)
See notes on labs. 

## 2017-12-03 ENCOUNTER — Encounter: Payer: Self-pay | Admitting: Family Medicine

## 2017-12-03 LAB — LAB REPORT - SCANNED: A1c: 602

## 2017-12-22 DIAGNOSIS — Z79899 Other long term (current) drug therapy: Secondary | ICD-10-CM | POA: Diagnosis not present

## 2017-12-22 DIAGNOSIS — I10 Essential (primary) hypertension: Secondary | ICD-10-CM | POA: Diagnosis not present

## 2017-12-22 DIAGNOSIS — Z6841 Body Mass Index (BMI) 40.0 and over, adult: Secondary | ICD-10-CM | POA: Diagnosis not present

## 2017-12-22 DIAGNOSIS — E785 Hyperlipidemia, unspecified: Secondary | ICD-10-CM | POA: Diagnosis not present

## 2017-12-22 DIAGNOSIS — Z79811 Long term (current) use of aromatase inhibitors: Secondary | ICD-10-CM | POA: Diagnosis not present

## 2017-12-22 DIAGNOSIS — Z96612 Presence of left artificial shoulder joint: Secondary | ICD-10-CM | POA: Diagnosis not present

## 2017-12-22 DIAGNOSIS — M19012 Primary osteoarthritis, left shoulder: Secondary | ICD-10-CM | POA: Diagnosis not present

## 2017-12-22 DIAGNOSIS — Z853 Personal history of malignant neoplasm of breast: Secondary | ICD-10-CM | POA: Diagnosis not present

## 2017-12-22 DIAGNOSIS — Z471 Aftercare following joint replacement surgery: Secondary | ICD-10-CM | POA: Diagnosis not present

## 2017-12-24 DIAGNOSIS — M19012 Primary osteoarthritis, left shoulder: Secondary | ICD-10-CM | POA: Diagnosis not present

## 2017-12-24 MED ORDER — MENTHOL 9.1 MG MT LOZG
1.00 | LOZENGE | OROMUCOSAL | Status: DC
Start: ? — End: 2017-12-24

## 2017-12-24 MED ORDER — GENERIC EXTERNAL MEDICATION
Status: DC
Start: ? — End: 2017-12-24

## 2017-12-24 MED ORDER — PANTOPRAZOLE SODIUM 20 MG PO TBEC
20.00 mg | DELAYED_RELEASE_TABLET | ORAL | Status: DC
Start: 2017-12-25 — End: 2017-12-24

## 2017-12-24 MED ORDER — ANASTROZOLE 1 MG PO TABS
1.00 mg | ORAL_TABLET | ORAL | Status: DC
Start: 2017-12-25 — End: 2017-12-24

## 2017-12-24 MED ORDER — NALOXONE HCL 4 MG/10ML IJ SOLN
0.10 mg | INTRAMUSCULAR | Status: DC
Start: ? — End: 2017-12-24

## 2017-12-24 MED ORDER — ACETAMINOPHEN 500 MG PO TABS
1000.00 mg | ORAL_TABLET | ORAL | Status: DC
Start: 2017-12-24 — End: 2017-12-24

## 2017-12-24 MED ORDER — LACTATED RINGERS IV SOLN
10.00 | INTRAVENOUS | Status: DC
Start: ? — End: 2017-12-24

## 2017-12-24 MED ORDER — OXYCODONE HCL 5 MG PO TABS
5.00 mg | ORAL_TABLET | ORAL | Status: DC
Start: ? — End: 2017-12-24

## 2017-12-24 MED ORDER — LACTATED RINGERS IV SOLN
100.00 | INTRAVENOUS | Status: DC
Start: ? — End: 2017-12-24

## 2017-12-24 MED ORDER — GABAPENTIN 100 MG PO CAPS
100.00 mg | ORAL_CAPSULE | ORAL | Status: DC
Start: 2017-12-24 — End: 2017-12-24

## 2017-12-24 MED ORDER — POLYETHYLENE GLYCOL 3350 17 G PO PACK
17.00 | PACK | ORAL | Status: DC
Start: ? — End: 2017-12-24

## 2017-12-24 MED ORDER — PRAVASTATIN SODIUM 20 MG PO TABS
20.00 mg | ORAL_TABLET | ORAL | Status: DC
Start: 2017-12-24 — End: 2017-12-24

## 2017-12-24 MED ORDER — MORPHINE SULFATE 2 MG/ML IJ SOLN
2.00 mg | INTRAMUSCULAR | Status: DC
Start: ? — End: 2017-12-24

## 2017-12-24 MED ORDER — BUPIVACAINE LIPOSOME IJ
1.00 | INTRAMUSCULAR | Status: DC
Start: ? — End: 2017-12-24

## 2017-12-24 MED ORDER — METOPROLOL TARTRATE 100 MG PO TABS
100.00 mg | ORAL_TABLET | ORAL | Status: DC
Start: 2017-12-24 — End: 2017-12-24

## 2017-12-24 MED ORDER — ASPIRIN 81 MG PO CHEW
81.00 mg | CHEWABLE_TABLET | ORAL | Status: DC
Start: 2017-12-25 — End: 2017-12-24

## 2017-12-25 DIAGNOSIS — Z853 Personal history of malignant neoplasm of breast: Secondary | ICD-10-CM | POA: Diagnosis not present

## 2017-12-25 DIAGNOSIS — Z471 Aftercare following joint replacement surgery: Secondary | ICD-10-CM | POA: Diagnosis not present

## 2017-12-25 DIAGNOSIS — Z96653 Presence of artificial knee joint, bilateral: Secondary | ICD-10-CM | POA: Diagnosis not present

## 2017-12-25 DIAGNOSIS — F329 Major depressive disorder, single episode, unspecified: Secondary | ICD-10-CM | POA: Diagnosis not present

## 2017-12-25 DIAGNOSIS — I10 Essential (primary) hypertension: Secondary | ICD-10-CM | POA: Diagnosis not present

## 2017-12-25 DIAGNOSIS — E785 Hyperlipidemia, unspecified: Secondary | ICD-10-CM | POA: Diagnosis not present

## 2017-12-25 DIAGNOSIS — Z96612 Presence of left artificial shoulder joint: Secondary | ICD-10-CM | POA: Diagnosis not present

## 2017-12-25 DIAGNOSIS — Z79891 Long term (current) use of opiate analgesic: Secondary | ICD-10-CM | POA: Diagnosis not present

## 2017-12-25 DIAGNOSIS — M1990 Unspecified osteoarthritis, unspecified site: Secondary | ICD-10-CM | POA: Diagnosis not present

## 2017-12-25 DIAGNOSIS — Z7982 Long term (current) use of aspirin: Secondary | ICD-10-CM | POA: Diagnosis not present

## 2017-12-25 DIAGNOSIS — Z9181 History of falling: Secondary | ICD-10-CM | POA: Diagnosis not present

## 2017-12-26 ENCOUNTER — Encounter: Payer: Self-pay | Admitting: Family Medicine

## 2017-12-30 DIAGNOSIS — F329 Major depressive disorder, single episode, unspecified: Secondary | ICD-10-CM | POA: Diagnosis not present

## 2017-12-30 DIAGNOSIS — M1990 Unspecified osteoarthritis, unspecified site: Secondary | ICD-10-CM | POA: Diagnosis not present

## 2017-12-30 DIAGNOSIS — Z79891 Long term (current) use of opiate analgesic: Secondary | ICD-10-CM | POA: Diagnosis not present

## 2017-12-30 DIAGNOSIS — Z7982 Long term (current) use of aspirin: Secondary | ICD-10-CM | POA: Diagnosis not present

## 2017-12-30 DIAGNOSIS — Z96612 Presence of left artificial shoulder joint: Secondary | ICD-10-CM | POA: Diagnosis not present

## 2017-12-30 DIAGNOSIS — Z9181 History of falling: Secondary | ICD-10-CM | POA: Diagnosis not present

## 2017-12-30 DIAGNOSIS — Z471 Aftercare following joint replacement surgery: Secondary | ICD-10-CM | POA: Diagnosis not present

## 2017-12-30 DIAGNOSIS — I10 Essential (primary) hypertension: Secondary | ICD-10-CM | POA: Diagnosis not present

## 2017-12-30 DIAGNOSIS — Z853 Personal history of malignant neoplasm of breast: Secondary | ICD-10-CM | POA: Diagnosis not present

## 2017-12-30 DIAGNOSIS — E785 Hyperlipidemia, unspecified: Secondary | ICD-10-CM | POA: Diagnosis not present

## 2017-12-30 DIAGNOSIS — Z96653 Presence of artificial knee joint, bilateral: Secondary | ICD-10-CM | POA: Diagnosis not present

## 2018-01-12 DIAGNOSIS — Z96612 Presence of left artificial shoulder joint: Secondary | ICD-10-CM | POA: Diagnosis not present

## 2018-01-12 DIAGNOSIS — M25512 Pain in left shoulder: Secondary | ICD-10-CM | POA: Diagnosis not present

## 2018-01-20 DIAGNOSIS — M25512 Pain in left shoulder: Secondary | ICD-10-CM | POA: Diagnosis not present

## 2018-01-20 DIAGNOSIS — Z96612 Presence of left artificial shoulder joint: Secondary | ICD-10-CM | POA: Diagnosis not present

## 2018-01-24 DIAGNOSIS — M25512 Pain in left shoulder: Secondary | ICD-10-CM | POA: Diagnosis not present

## 2018-01-24 DIAGNOSIS — Z96612 Presence of left artificial shoulder joint: Secondary | ICD-10-CM | POA: Diagnosis not present

## 2018-01-27 DIAGNOSIS — M25512 Pain in left shoulder: Secondary | ICD-10-CM | POA: Diagnosis not present

## 2018-01-27 DIAGNOSIS — Z96612 Presence of left artificial shoulder joint: Secondary | ICD-10-CM | POA: Diagnosis not present

## 2018-01-31 DIAGNOSIS — M25512 Pain in left shoulder: Secondary | ICD-10-CM | POA: Diagnosis not present

## 2018-01-31 DIAGNOSIS — Z96612 Presence of left artificial shoulder joint: Secondary | ICD-10-CM | POA: Diagnosis not present

## 2018-02-03 DIAGNOSIS — M25512 Pain in left shoulder: Secondary | ICD-10-CM | POA: Diagnosis not present

## 2018-02-03 DIAGNOSIS — Z96612 Presence of left artificial shoulder joint: Secondary | ICD-10-CM | POA: Diagnosis not present

## 2018-02-07 DIAGNOSIS — Z96612 Presence of left artificial shoulder joint: Secondary | ICD-10-CM | POA: Diagnosis not present

## 2018-02-07 DIAGNOSIS — M25512 Pain in left shoulder: Secondary | ICD-10-CM | POA: Diagnosis not present

## 2018-02-10 ENCOUNTER — Other Ambulatory Visit: Payer: BLUE CROSS/BLUE SHIELD

## 2018-02-10 ENCOUNTER — Ambulatory Visit: Payer: BLUE CROSS/BLUE SHIELD | Admitting: Hematology and Oncology

## 2018-02-10 DIAGNOSIS — M25512 Pain in left shoulder: Secondary | ICD-10-CM | POA: Diagnosis not present

## 2018-02-10 DIAGNOSIS — Z96612 Presence of left artificial shoulder joint: Secondary | ICD-10-CM | POA: Diagnosis not present

## 2018-02-11 DIAGNOSIS — M25512 Pain in left shoulder: Secondary | ICD-10-CM | POA: Diagnosis not present

## 2018-02-11 DIAGNOSIS — Z96612 Presence of left artificial shoulder joint: Secondary | ICD-10-CM | POA: Diagnosis not present

## 2018-02-14 DIAGNOSIS — Z96612 Presence of left artificial shoulder joint: Secondary | ICD-10-CM | POA: Diagnosis not present

## 2018-02-14 DIAGNOSIS — M25512 Pain in left shoulder: Secondary | ICD-10-CM | POA: Diagnosis not present

## 2018-02-15 ENCOUNTER — Other Ambulatory Visit: Payer: Self-pay | Admitting: Hematology and Oncology

## 2018-02-17 DIAGNOSIS — Z96612 Presence of left artificial shoulder joint: Secondary | ICD-10-CM | POA: Diagnosis not present

## 2018-02-17 DIAGNOSIS — M25512 Pain in left shoulder: Secondary | ICD-10-CM | POA: Diagnosis not present

## 2018-02-21 DIAGNOSIS — M25512 Pain in left shoulder: Secondary | ICD-10-CM | POA: Diagnosis not present

## 2018-02-21 DIAGNOSIS — Z96612 Presence of left artificial shoulder joint: Secondary | ICD-10-CM | POA: Diagnosis not present

## 2018-02-24 ENCOUNTER — Inpatient Hospital Stay: Payer: BLUE CROSS/BLUE SHIELD | Attending: Hematology and Oncology

## 2018-02-24 ENCOUNTER — Encounter: Payer: Self-pay | Admitting: Hematology and Oncology

## 2018-02-24 ENCOUNTER — Other Ambulatory Visit: Payer: Self-pay | Admitting: Hematology and Oncology

## 2018-02-24 ENCOUNTER — Inpatient Hospital Stay (HOSPITAL_BASED_OUTPATIENT_CLINIC_OR_DEPARTMENT_OTHER): Payer: BLUE CROSS/BLUE SHIELD | Admitting: Hematology and Oncology

## 2018-02-24 VITALS — BP 143/73 | HR 71 | Temp 97.7°F | Resp 18 | Wt 290.2 lb

## 2018-02-24 DIAGNOSIS — C50411 Malignant neoplasm of upper-outer quadrant of right female breast: Secondary | ICD-10-CM | POA: Diagnosis not present

## 2018-02-24 DIAGNOSIS — M85851 Other specified disorders of bone density and structure, right thigh: Secondary | ICD-10-CM

## 2018-02-24 DIAGNOSIS — Z79811 Long term (current) use of aromatase inhibitors: Secondary | ICD-10-CM | POA: Insufficient documentation

## 2018-02-24 DIAGNOSIS — Z170 Estrogen receptor positive status [ER+]: Secondary | ICD-10-CM | POA: Diagnosis not present

## 2018-02-24 DIAGNOSIS — M25512 Pain in left shoulder: Secondary | ICD-10-CM | POA: Diagnosis not present

## 2018-02-24 DIAGNOSIS — C50311 Malignant neoplasm of lower-inner quadrant of right female breast: Secondary | ICD-10-CM

## 2018-02-24 DIAGNOSIS — Z96612 Presence of left artificial shoulder joint: Secondary | ICD-10-CM | POA: Diagnosis not present

## 2018-02-24 LAB — CBC WITH DIFFERENTIAL/PLATELET
Basophils Absolute: 0.1 10*3/uL (ref 0–0.1)
Basophils Relative: 1 %
Eosinophils Absolute: 0.2 10*3/uL (ref 0–0.7)
Eosinophils Relative: 2 %
HCT: 41 % (ref 35.0–47.0)
Hemoglobin: 14.2 g/dL (ref 12.0–16.0)
Lymphocytes Relative: 23 %
Lymphs Abs: 2.1 10*3/uL (ref 1.0–3.6)
MCH: 29.9 pg (ref 26.0–34.0)
MCHC: 34.7 g/dL (ref 32.0–36.0)
MCV: 86 fL (ref 80.0–100.0)
Monocytes Absolute: 0.6 10*3/uL (ref 0.2–0.9)
Monocytes Relative: 7 %
Neutro Abs: 6.1 10*3/uL (ref 1.4–6.5)
Neutrophils Relative %: 67 %
Platelets: 241 10*3/uL (ref 150–440)
RBC: 4.76 MIL/uL (ref 3.80–5.20)
RDW: 14.1 % (ref 11.5–14.5)
WBC: 9.2 10*3/uL (ref 3.6–11.0)

## 2018-02-24 LAB — COMPREHENSIVE METABOLIC PANEL
ALT: 28 U/L (ref 14–54)
AST: 31 U/L (ref 15–41)
Albumin: 3.7 g/dL (ref 3.5–5.0)
Alkaline Phosphatase: 94 U/L (ref 38–126)
Anion gap: 9 (ref 5–15)
BUN: 18 mg/dL (ref 6–20)
CO2: 28 mmol/L (ref 22–32)
Calcium: 9.3 mg/dL (ref 8.9–10.3)
Chloride: 101 mmol/L (ref 101–111)
Creatinine, Ser: 0.9 mg/dL (ref 0.44–1.00)
GFR calc Af Amer: >60 mL/min (ref >60)
GFR calc non Af Amer: >60 mL/min (ref >60)
Glucose, Bld: 113 mg/dL — ABNORMAL HIGH (ref 65–99)
Potassium: 4.2 mmol/L (ref 3.5–5.1)
Sodium: 138 mmol/L (ref 135–145)
Total Bilirubin: 0.5 mg/dL (ref 0.3–1.2)
Total Protein: 7.1 g/dL (ref 6.5–8.1)

## 2018-02-24 MED ORDER — ANASTROZOLE 1 MG PO TABS: 1.0000 mg | ORAL_TABLET | Freq: Every day | ORAL | 3 refills | Status: DC

## 2018-02-24 NOTE — Progress Notes (Signed)
Patient had left shoulder replacement in February.  She states she had discussed taking Prolia at her last visit.  She is in the process of having dental work done and is not interested in starting Prolia at this time.  Patient offers no complaints related to her breast cancer.

## 2018-02-24 NOTE — Progress Notes (Signed)
[facility] day:  02/24/18  Chief Complaint: Tina Delgado is a 62 y.o. female with a history of stage IIB left breast cancer who is seen for 6 month assessment.  HPI: The patient was last seen in the medical oncology clinic on 08/12/2017.  At that time, she was doing well. She complained of LEFT shoulder pain in the setting of a known rotator cuff. There were no breast concerns. Exam was stable.  CA27.29 was normal.  Mammogram was normal.  Bone density revealed osteopenia.  We discussed Prolia.  She continued Arimidex.  Symptomatically, patient is doing well. She is s/p LEFT shoulder replacement. Second MRI did no demonstrate the tear, however it did show the need for replacement. Patient denies post-operative issues.   Patient does not verbalize any concerns with regards to her breasts today. She continues on her Arimidex as previously prescribed. She has not experienced any B symptoms or recent infections. Patient is eating well. Weight is down 7 pounds.   She denies pain in the clinic today.    Past Medical History:  Diagnosis Date  . Allergy   . Arthritis    s/p knee injection per ortho  . Breast cancer ([place]) 2014   RT LUMPECTOMY  . Dyspnea   . Elevated glucose 05/2003   106  . GERD (gastroesophageal reflux disease)   . Hyperlipidemia   . Hypertension   . Malignant neoplasm of upper-outer quadrant of female breast [facility]) July 20, 2013   histologic grade 3 invasive mammary carcinoma, 13 mm,  2/16 nodes positive, ER-positive, PR positive, HER-2/neu not amplified.T1c,N1a  . Mammographic microcalcification   . Morbid obesity ([place])   . Personal history of chemotherapy 2014   BREAST CA  . Personal history of radiation therapy 2014   BREAST CA  . Torn rotator cuff 12/2015   left    Past Surgical History:  Procedure Laterality Date  . BREAST EXCISIONAL BIOPSY Right 2014   positive  . BREAST SURGERY Right 07-20-13   Wide  excision, SLN biopsy, axillary dissection.  . COLONOSCOPY  [id number]  . JOINT REPLACEMENT  07/30/2007   LTKR Dr Derrel Nip  . KNEE ARTHROSCOPY  03/04/2007   left Dr Derrel Nip  . PORTACATH PLACEMENT  2014  . s/p chemotherapy Right    right breast cancer   . s/p radiation therapy Right    right breast cancer  . TOTAL KNEE ARTHROPLASTY Right 01/19/2017   Procedure: TOTAL KNEE ARTHROPLASTY;  Surgeon: Corky Mull, MD;  Location: [facility] ORS;  Service: Orthopedics;  Laterality: Right;  . TOTAL SHOULDER ARTHROPLASTY Left 2019  . TUBAL LIGATION  ~1986   BTL    Family History  Problem Relation Age of Onset  . Heart disease Mother        MI  . Depression Mother        paranoia  . Hypertension Mother   . Stroke Mother   . Parkinsonism Mother   . Heart disease Brother        MI PTCA at 49  . Heart disease Father   . Stomach cancer Maternal Grandfather 47  . Breast cancer Cousin   . Colon cancer Paternal Uncle     Social History:  reports that she has never smoked. She has never used smokeless tobacco. She reports that she drinks alcohol. She reports that she does not use drugs.  She lives in [place].  The patient is alone today.  Allergies:  Allergies  Allergen Reactions  . Simvastatin Other (See Comments)    Myalgias.    . Latex Rash and Other (See Comments)    More adhesive allergy than latex.  Has never been RAST tested. Thinks paper tape is okay.  . Lisinopril Cough  . Sulfonamide Derivatives Hives and Other (See Comments)    REACTION: itching years ago    Current Medications: Current Outpatient Medications  Medication Sig Dispense Refill  . acetaminophen (TYLENOL) 500 MG tablet Take 1,000 mg every 6 (six) hours as needed by mouth for moderate pain or headache.     [name] amoxicillin (AMOXIL) 500 MG capsule Take 2,000 mg See admin instructions by mouth. Take 2000 mg by mouth 1 hour prior to dental procdures    . anastrozole (ARIMIDEX) 1 MG tablet Take 1 tablet (1 mg total) by mouth  daily. 90 tablet 3  . Calcium Carb-Cholecalciferol (CALCIUM 600 + D PO) Take 1 tablet by mouth 2 (two) times daily.    . cholecalciferol (VITAMIN D) 1000 units tablet Take 2 tablets (2,000 Units total) by mouth daily.    [name] docusate sodium (COLACE) 100 MG capsule Take 100 mg daily as needed by mouth for mild constipation.    . fluticasone (FLONASE) 50 MCG/ACT nasal spray Place 1 spray into both nostrils daily as needed for allergies or rhinitis.     . metoprolol tartrate (LOPRESSOR) 100 MG tablet Take 1 tablet (100 mg total) by mouth 2 (two) times daily. 180 tablet 3  . omeprazole (PRILOSEC) 20 MG capsule Take 1 capsule (20 mg total) by mouth daily. 90 capsule 3  . pravastatin (PRAVACHOL) 20 MG tablet Take 1 tablet (20 mg total) by mouth every evening.     No current facility-administered medications for this visit.     Review of Systems:  GENERAL:  Feels good.  No fevers or sweats. Weight down 7 pounds.  PERFORMANCE STATUS (ECOG):  1 HEENT:  No visual changes, runny nose, sore throat, mouth sores or tenderness. Lungs: No shortness of breath or cough.  No hemoptysis. Cardiac:  No chest pain, palpitations, orthopnea, or PND. GI:  No nausea, vomiting, diarrhea, constipation, melena or hematochezia. GU:  No urgency, frequency, dysuria, or hematuria. Musculoskeletal: Arthritis. s/p LEFT shoulder placement and RIGHT knee arthroplasty. No back pain.  No muscle tenderness. Extremities:  No pain or swelling. Skin:  No rashes or skin changes. Neuro:  No headache, numbness or weakness, balance or coordination issues. Endocrine:  No diabetes, thyroid issues, hot flashes or night sweats. Psych:  No mood changes, depression or anxiety. Pain:  No focal pain. Review of systems:  All other systems reviewed and found to be negative.   Physical Exam: Blood pressure (!) 143/73, pulse 71, temperature 97.7 F (36.5 C), temperature source Tympanic, resp. rate 18, weight 290 lb 4 oz (131.7 kg). GENERAL:   Well developed, well nourished, heavyset woman sitting comfortably in the exam room in no acute distress. MENTAL STATUS:  Alert and oriented to person, place and time. HEAD:  Short gray hair.  Normocephalic, atraumatic, face symmetric, no Cushingoid features. EYES:  Blue eyes.  Pupils equal round and reactive to light and accomodation.  No conjunctivitis or scleral icterus. ENT:  Oropharynx clear without lesion.  Tongue normal. Mucous membranes moist.  RESPIRATORY:  Clear to auscultation without rales, wheezes or rhonchi. CARDIOVASCULAR:  Regular rate and rhythm without murmur, rub or gallop. BREAST:  Right breast with post-operative changes at 3 o'clock position.  No masses, skin changes or nipple  discharge.  Left breast without masses, skin changes or nipple discharge. Fibrocystic changes, mild. ABDOMEN:  Soft, non-tender, with active bowel sounds, and no hepatosplenomegaly.  No masses. SKIN:  No rashes, ulcers or lesions. EXTREMITIES: No edema, no skin discoloration or tenderness.  No palpable cords. LYMPH NODES: No palpable cervical, supraclavicular, axillary or inguinal adenopathy  NEUROLOGICAL: Unremarkable. PSYCH:  Appropriate.    Appointment on 02/24/2018  Component Date Value Ref Range Status  . WBC 02/24/2018 9.2  3.6 - 11.0 K/uL Final  . RBC 02/24/2018 4.76  3.80 - 5.20 MIL/uL Final  . Hemoglobin 02/24/2018 14.2  12.0 - 16.0 g/dL Final  . HCT 02/24/2018 41.0  35.0 - 47.0 % Final  . MCV 02/24/2018 86.0  80.0 - 100.0 fL Final  . MCH 02/24/2018 29.9  26.0 - 34.0 pg Final  . MCHC 02/24/2018 34.7  32.0 - 36.0 g/dL Final  . RDW 02/24/2018 14.1  11.5 - 14.5 % Final  . Platelets 02/24/2018 241  150 - 440 K/uL Final  . Neutrophils Relative % 02/24/2018 67  % Final  . Neutro Abs 02/24/2018 6.1  1.4 - 6.5 K/uL Final  . Lymphocytes Relative 02/24/2018 23  % Final  . Lymphs Abs 02/24/2018 2.1  1.0 - 3.6 K/uL Final  . Monocytes Relative 02/24/2018 7  % Final  . Monocytes Absolute  02/24/2018 0.6  0.2 - 0.9 K/uL Final  . Eosinophils Relative 02/24/2018 2  % Final  . Eosinophils Absolute 02/24/2018 0.2  0 - 0.7 K/uL Final  . Basophils Relative 02/24/2018 1  % Final  . Basophils Absolute 02/24/2018 0.1  0 - 0.1 K/uL Final   Performed at [facility], [address] [place], [place] [address]  . Sodium 02/24/2018 138  135 - 145 mmol/L Final  . Potassium 02/24/2018 4.2  3.5 - 5.1 mmol/L Final  . Chloride 02/24/2018 101  101 - 111 mmol/L Final  . CO2 02/24/2018 28  22 - 32 mmol/L Final  . Glucose, Bld 02/24/2018 113* 65 - 99 mg/dL Final  . BUN 02/24/2018 18  6 - 20 mg/dL Final  . Creatinine, Ser 02/24/2018 0.90  0.44 - 1.00 mg/dL Final  . Calcium 02/24/2018 9.3  8.9 - 10.3 mg/dL Final  . Total Protein 02/24/2018 7.1  6.5 - 8.1 g/dL Final  . Albumin 02/24/2018 3.7  3.5 - 5.0 g/dL Final  . AST 02/24/2018 31  15 - 41 U/L Final  . ALT 02/24/2018 28  14 - 54 U/L Final  . Alkaline Phosphatase 02/24/2018 94  38 - 126 U/L Final  . Total Bilirubin 02/24/2018 0.5  0.3 - 1.2 mg/dL Final  . GFR calc non Af Amer 02/24/2018 >60  >60 mL/min Final  . GFR calc Af Amer 02/24/2018 >60  >60 mL/min Final   Comment: (NOTE) The eGFR has been calculated using the CKD EPI equation. This calculation has not been validated in all clinical situations. eGFR's persistently <60 mL/min signify possible Chronic Kidney Disease.   Georgiann Hahn gap 02/24/2018 9  5 - 15 Final   Performed at [facility], [address] [place], [place] [address]    Assessment:  Tina Delgado is a 62 y.o. female with a history of stage IIB right breast cancer s/p lumpectomy on 05/2013.  Pathology revealed a grade III 1.3 cm lesion with 2 of 16 lymph nodes positive.  Pathologic stage was T1cN1M0 lesion.  Tumor was ER+/PR+ and Her2/neu- .  She enrolled on NSABP B47 clinical  trial.  She received 4 cycles of TC (shortened course due to adverse events) from 07/28/2013 to 10/27/2013.  She received breast  radiation from 12/18/2013 to 02/06/2014.  In 2015, she took Femara (letrozole) x 1 month.  She was switched to Arimidex secondary to dysgeusia (metallic taste).  She is tolerating Arimidex well.  Bilateral mammogram on  07/09/2017 revealed no evidence of malignancy within either breast.  There were stable postsurgical changes within the right breast.  CA27.29 has been followed: 14.7 on 02/21/2014, 12.4 on 05/09/2015, 10.6 on 10/07/2015, 18.1 on 02/10/2016, 15.0 on 08/12/2016, 18.5 on 02/08/2017, 17.4 on 08/12/2017, and 11.2 on 02/24/2018.  Bone density study on 11/15/2014 revealed osteopenia with a T-score of -1.2 in right femur.  T-score of -1.4 in the right femur was noted on 03/16/2017 study.  She is on calcium and vitamin D.  She underwent right knee arthroplasty for osteoarthritis on 01/19/2017.  Symptomatically, she is doing well. She is s/p LEFT shoulder replacement. Pain in shoulder has resolved. Her only limitation, from a ROM standpoint, is an inability to scratch her back. There are no breast concerns. Exam is stable.   Plan: 1.  Labs today:  CBC with diff, CMP, CA27.29. 2.  Continue to follow up with Dr. Bary Castilla.  Mammogram due in September.  3.  Continue Arimidex 1 mg daily. Refills sent in today.  4.  Dicussed the use of Prolia to prevent bone thinning/loss associated with hormonal therapy. Side effects of this medication reviewed. Discussed the need for dental clearance prior to beginning Prolia, as this medication increases the risk of dental complications such as osteonecrosis of the jaw. Patient has planned dental procedures coming up. Requests to defer at this time. 5.  RTC in 6 months for MD assessment and labs (CBC with diff, CMP, CA27.29).    Honor Loh, NP  02/24/2018, 11:30 AM   I saw and evaluated the patient, participating in the key portions of the service and reviewing pertinent diagnostic studies and records.  I reviewed the nurse practitioner's note and agree with the  findings and the plan.  A few questions were asked by the patient and answered.   Nolon Stalls, MD 02/24/2018,11:30 AM

## 2018-02-25 LAB — CANCER ANTIGEN 27.29: CA 27.29: 11.2 U/mL (ref 0.0–38.6)

## 2018-02-28 DIAGNOSIS — Z96612 Presence of left artificial shoulder joint: Secondary | ICD-10-CM | POA: Diagnosis not present

## 2018-02-28 DIAGNOSIS — M25512 Pain in left shoulder: Secondary | ICD-10-CM | POA: Diagnosis not present

## 2018-03-03 ENCOUNTER — Other Ambulatory Visit: Payer: Self-pay

## 2018-03-03 ENCOUNTER — Encounter: Payer: Self-pay | Admitting: Radiation Oncology

## 2018-03-03 ENCOUNTER — Ambulatory Visit
Admission: RE | Admit: 2018-03-03 | Discharge: 2018-03-03 | Disposition: A | Discharge status: Home / Self Care | Payer: BLUE CROSS/BLUE SHIELD | Source: Ambulatory Visit | Attending: Radiation Oncology | Admitting: Radiation Oncology

## 2018-03-03 VITALS — BP 170/98 | HR 81 | Temp 96.3°F | Resp 18 | Wt 291.0 lb

## 2018-03-03 DIAGNOSIS — Z79811 Long term (current) use of aromatase inhibitors: Secondary | ICD-10-CM | POA: Diagnosis not present

## 2018-03-03 DIAGNOSIS — Z96612 Presence of left artificial shoulder joint: Secondary | ICD-10-CM | POA: Diagnosis not present

## 2018-03-03 DIAGNOSIS — Z923 Personal history of irradiation: Secondary | ICD-10-CM | POA: Insufficient documentation

## 2018-03-03 DIAGNOSIS — C50311 Malignant neoplasm of lower-inner quadrant of right female breast: Secondary | ICD-10-CM | POA: Insufficient documentation

## 2018-03-03 DIAGNOSIS — Z170 Estrogen receptor positive status [ER+]: Secondary | ICD-10-CM | POA: Diagnosis not present

## 2018-03-03 DIAGNOSIS — M25512 Pain in left shoulder: Secondary | ICD-10-CM | POA: Diagnosis not present

## 2018-03-03 NOTE — Progress Notes (Signed)
Radiation Oncology Follow up Note  Name: Tina Delgado   Date:   03/03/2018 MRN:  [id number] DOB: September 11, 1956    This 62 y.o. female presents to the clinic today for for your follow-up status post whole breast radiation to her right breast for stage IIa invasive mammary carcinoma.  REFERRING PROVIDER: Tonia Ghent, MD  HPI: patient is a 62 year old female now out 4 years having completed whole breast radiation to her right breast for stage IIa invasive mammary carcinoma. Seen today in routine follow-up she is doing well. She specifically denies breast tenderness cough or bone pain..her mammogram back in September which I have reviewed was BI-RADS 2 benign.she's currently on arimadex [place] that well without side effect.  COMPLICATIONS OF TREATMENT: none  FOLLOW UP COMPLIANCE: keeps appointments   PHYSICAL EXAM:  BP (!) 170/98   Pulse 81   Temp (!) 96.3 F (35.7 C)   Resp 18   Wt 291 lb 0.1 oz (132 kg)   BMI 48.43 kg/m  Lungs are clear to A&P cardiac examination essentially unremarkable with regular rate and rhythm. No dominant mass or nodularity is noted in either breast in 2 positions examined. Incision is well-healed. No axillary or supraclavicular adenopathy is appreciated. Cosmetic result is excellent. Well-developed well-nourished patient in NAD. HEENT reveals PERLA, EOMI, discs not visualized.  Oral cavity is clear. No oral mucosal lesions are identified. Neck is clear without evidence of cervical or supraclavicular adenopathy. Lungs are clear to A&P. Cardiac examination is essentially unremarkable with regular rate and rhythm without murmur rub or thrill. Abdomen is benign with no organomegaly or masses noted. Motor sensory and DTR levels are equal and symmetric in the upper and lower extremities. Cranial nerves II through XII are grossly intact. Proprioception is intact. No peripheral adenopathy or edema is identified. No motor or sensory levels are noted. Crude visual  fields are within normal range.  RADIOLOGY RESULTS: mammograms reviewed and compatible with the above-stated findings  PLAN: present time patient is doing well with no evidence of disease. I'm going to discontinue follow-up care. She continues close follow-up care with Dr. Tollie Pizza and medical oncology. We'll be happy to reevaluate the patient any time should she have any concerns.  I would like to take this opportunity to thank you for allowing me to participate in the care of your patient.Noreene Filbert, MD

## 2018-03-07 DIAGNOSIS — Z96612 Presence of left artificial shoulder joint: Secondary | ICD-10-CM | POA: Diagnosis not present

## 2018-03-07 DIAGNOSIS — M25512 Pain in left shoulder: Secondary | ICD-10-CM | POA: Diagnosis not present

## 2018-03-10 DIAGNOSIS — Z96612 Presence of left artificial shoulder joint: Secondary | ICD-10-CM | POA: Diagnosis not present

## 2018-03-10 DIAGNOSIS — M25512 Pain in left shoulder: Secondary | ICD-10-CM | POA: Diagnosis not present

## 2018-03-14 DIAGNOSIS — Z96612 Presence of left artificial shoulder joint: Secondary | ICD-10-CM | POA: Diagnosis not present

## 2018-03-14 DIAGNOSIS — M25512 Pain in left shoulder: Secondary | ICD-10-CM | POA: Diagnosis not present

## 2018-03-22 DIAGNOSIS — M25512 Pain in left shoulder: Secondary | ICD-10-CM | POA: Diagnosis not present

## 2018-03-22 DIAGNOSIS — Z96612 Presence of left artificial shoulder joint: Secondary | ICD-10-CM | POA: Diagnosis not present

## 2018-03-24 DIAGNOSIS — M25512 Pain in left shoulder: Secondary | ICD-10-CM | POA: Diagnosis not present

## 2018-03-24 DIAGNOSIS — Z96612 Presence of left artificial shoulder joint: Secondary | ICD-10-CM | POA: Diagnosis not present

## 2018-03-28 DIAGNOSIS — M25512 Pain in left shoulder: Secondary | ICD-10-CM | POA: Diagnosis not present

## 2018-03-28 DIAGNOSIS — Z96612 Presence of left artificial shoulder joint: Secondary | ICD-10-CM | POA: Diagnosis not present

## 2018-03-31 DIAGNOSIS — Z96612 Presence of left artificial shoulder joint: Secondary | ICD-10-CM | POA: Diagnosis not present

## 2018-03-31 DIAGNOSIS — M25512 Pain in left shoulder: Secondary | ICD-10-CM | POA: Diagnosis not present

## 2018-04-04 DIAGNOSIS — Z96612 Presence of left artificial shoulder joint: Secondary | ICD-10-CM | POA: Diagnosis not present

## 2018-04-04 DIAGNOSIS — M25512 Pain in left shoulder: Secondary | ICD-10-CM | POA: Diagnosis not present

## 2018-04-05 ENCOUNTER — Encounter: Payer: Self-pay | Admitting: Family Medicine

## 2018-04-05 LAB — LAB REPORT - SCANNED
ALT: 25
AST: 24
Cholesterol, Total: 172
Creatinine, Ser: 0.94
Glucose: 116
HDL: 37
Hemoglobin A1C: 6.3
Hemoglobin: 14.1
LDL Cholesterol (Calc): 107
Platelets: 260
TSH: 3.59
Triglycerides: 139
Uric Acid: 5.9

## 2018-04-07 DIAGNOSIS — M25512 Pain in left shoulder: Secondary | ICD-10-CM | POA: Diagnosis not present

## 2018-04-07 DIAGNOSIS — Z96612 Presence of left artificial shoulder joint: Secondary | ICD-10-CM | POA: Diagnosis not present

## 2018-04-08 DIAGNOSIS — M19012 Primary osteoarthritis, left shoulder: Secondary | ICD-10-CM | POA: Diagnosis not present

## 2018-04-13 ENCOUNTER — Telehealth: Payer: Self-pay

## 2018-04-13 NOTE — Telephone Encounter (Signed)
Copied from [place] [id number]. Topic: General - Other >> Apr 13, 2018  2:56 PM Delgado, Tina wrote: Reason for CRM: Pt requests a return call. Cb# [phone]

## 2018-04-13 NOTE — Telephone Encounter (Signed)
Spoke to patient and was advised that she just wanted to know what the office hours were and she found out when she came by to pick up a urine cup for her mom.

## 2018-04-20 ENCOUNTER — Encounter: Payer: Self-pay | Admitting: General Surgery

## 2018-04-20 NOTE — Progress Notes (Signed)
Laboratory studies dated March 29, 2018 were reviewed.  Hemoglobin A1c elevated at 6.3.  BUN 14, creatinine 0.94, estimated GFR 65.  Normal electrolytes.  Modest elevation of the LDH of 228 (119-226).  Low HDL, cholesterol 172.  Hemoglobin 14.1.  MCV 87.  White blood cell count 10,000.  Platelet count 260,000.  TSH 3.59.

## 2018-06-01 ENCOUNTER — Other Ambulatory Visit: Payer: Self-pay

## 2018-06-01 DIAGNOSIS — Z170 Estrogen receptor positive status [ER+]: Secondary | ICD-10-CM

## 2018-06-01 DIAGNOSIS — C50311 Malignant neoplasm of lower-inner quadrant of right female breast: Secondary | ICD-10-CM

## 2018-06-07 ENCOUNTER — Other Ambulatory Visit: Payer: Self-pay | Admitting: General Surgery

## 2018-06-07 DIAGNOSIS — Z1231 Encounter for screening mammogram for malignant neoplasm of breast: Secondary | ICD-10-CM

## 2018-06-15 ENCOUNTER — Encounter: Payer: Self-pay | Admitting: *Deleted

## 2018-07-12 ENCOUNTER — Ambulatory Visit
Admission: RE | Admit: 2018-07-12 | Discharge: 2018-07-12 | Disposition: A | Discharge status: Home / Self Care | Payer: BLUE CROSS/BLUE SHIELD | Source: Ambulatory Visit | Attending: General Surgery | Admitting: General Surgery

## 2018-07-12 DIAGNOSIS — Z1231 Encounter for screening mammogram for malignant neoplasm of breast: Secondary | ICD-10-CM | POA: Diagnosis not present

## 2018-07-13 ENCOUNTER — Other Ambulatory Visit: Payer: Self-pay | Admitting: General Surgery

## 2018-07-13 DIAGNOSIS — R928 Other abnormal and inconclusive findings on diagnostic imaging of breast: Secondary | ICD-10-CM

## 2018-07-13 DIAGNOSIS — R921 Mammographic calcification found on diagnostic imaging of breast: Secondary | ICD-10-CM

## 2018-07-15 DIAGNOSIS — M19012 Primary osteoarthritis, left shoulder: Secondary | ICD-10-CM | POA: Diagnosis not present

## 2018-07-27 ENCOUNTER — Ambulatory Visit
Admission: RE | Admit: 2018-07-27 | Discharge: 2018-07-27 | Disposition: A | Discharge status: Home / Self Care | Payer: BLUE CROSS/BLUE SHIELD | Source: Ambulatory Visit | Attending: General Surgery | Admitting: General Surgery

## 2018-07-27 DIAGNOSIS — R921 Mammographic calcification found on diagnostic imaging of breast: Secondary | ICD-10-CM | POA: Diagnosis not present

## 2018-07-27 DIAGNOSIS — R928 Other abnormal and inconclusive findings on diagnostic imaging of breast: Secondary | ICD-10-CM

## 2018-08-10 ENCOUNTER — Other Ambulatory Visit: Payer: Self-pay | Admitting: Family Medicine

## 2018-08-10 NOTE — Telephone Encounter (Signed)
Electronic refill Pravastatin Last office visit 11/24/17 Upcoming appointment 08/22/18 See allergy/contraindication

## 2018-08-10 NOTE — Telephone Encounter (Signed)
Okay to continue.  Sent.  Thanks. 

## 2018-08-11 ENCOUNTER — Ambulatory Visit: Payer: BLUE CROSS/BLUE SHIELD | Admitting: General Surgery

## 2018-08-11 ENCOUNTER — Encounter: Payer: Self-pay | Admitting: General Surgery

## 2018-08-11 VITALS — BP 136/74 | HR 82 | Resp 15 | Ht 65.0 in | Wt 284.0 lb

## 2018-08-11 DIAGNOSIS — Z170 Estrogen receptor positive status [ER+]: Secondary | ICD-10-CM | POA: Diagnosis not present

## 2018-08-11 DIAGNOSIS — C50311 Malignant neoplasm of lower-inner quadrant of right female breast: Secondary | ICD-10-CM

## 2018-08-11 NOTE — Patient Instructions (Signed)
v

## 2018-08-11 NOTE — Progress Notes (Signed)
Patient ID: Tina Delgado, female   DOB: [birth year], 62 y.o.   MRN: [id number]  Chief Complaint  Patient presents with  . Follow-up    HPI Tina Delgado is a 62 y.o. female who presents for a breast evaluation. The most recent mammogram was done on 07/12/2018.  Patient does perform regular self breast checks and gets regular mammograms done.Patient had left shoulder surgery in February.      Past Medical History:  Diagnosis Date  . Allergy   . Arthritis    s/p knee injection per ortho  . Breast cancer ([place]) 2014   RT LUMPECTOMY  . Dyspnea   . Elevated glucose 05/2003   106  . GERD (gastroesophageal reflux disease)   . Hyperlipidemia   . Hypertension   . Malignant neoplasm of upper-outer quadrant of female breast [facility]) July 20, 2013   histologic grade 3 invasive mammary carcinoma, 13 mm,  2/16 nodes positive, ER-positive, PR positive, HER-2/neu not amplified.T1c,N1a  . Mammographic microcalcification   . Morbid obesity ([place])   . Personal history of chemotherapy 2014   BREAST CA  . Personal history of radiation therapy 2014   BREAST CA  . Torn rotator cuff 12/2015   left    Past Surgical History:  Procedure Laterality Date  . BREAST EXCISIONAL BIOPSY Right 06/2013   [facility], clear margins, LN positive  . BREAST SURGERY Right 07-20-13   Wide excision, SLN biopsy, axillary dissection.  . COLONOSCOPY  [id number]  . JOINT REPLACEMENT  07/30/2007   LTKR Dr Derrel Nip  . KNEE ARTHROSCOPY  03/04/2007   left Dr Derrel Nip  . PORTACATH PLACEMENT  2014  . s/p chemotherapy Right    right breast cancer   . s/p radiation therapy Right    right breast cancer  . TOTAL KNEE ARTHROPLASTY Right 01/19/2017   Procedure: TOTAL KNEE ARTHROPLASTY;  Surgeon: Corky Mull, MD;  Location: [facility] ORS;  Service: Orthopedics;  Laterality: Right;  . TOTAL SHOULDER ARTHROPLASTY Left 2019  . TUBAL LIGATION  ~1986   BTL    Family History  Problem Relation Age of Onset  . Heart disease Mother         MI  . Depression Mother        paranoia  . Hypertension Mother   . Stroke Mother   . Parkinsonism Mother   . Heart disease Brother        MI PTCA at 22  . Heart disease Father   . Stomach cancer Maternal Grandfather 19  . Breast cancer Cousin   . Colon cancer Paternal Uncle     Social History Social History   Tobacco Use  . Smoking status: Never Smoker  . Smokeless tobacco: Never Used  Substance Use Topics  . Alcohol use: Yes    Comment: rare  . Drug use: No    Allergies  Allergen Reactions  . Simvastatin Other (See Comments)    Myalgias.    . Latex Rash and Other (See Comments)    More adhesive allergy than latex.  Has never been RAST tested. Thinks paper tape is okay.  . Lisinopril Cough  . Sulfonamide Derivatives Hives and Other (See Comments)    REACTION: itching years ago    Current Outpatient Medications  Medication Sig Dispense Refill  . acetaminophen (TYLENOL) 500 MG tablet Take 1,000 mg every 6 (six) hours as needed by mouth for moderate pain or headache.     [name] amoxicillin (AMOXIL) 500 MG capsule Take 2,000 mg  See admin instructions by mouth. Take 2000 mg by mouth 1 hour prior to dental procdures    . anastrozole (ARIMIDEX) 1 MG tablet Take 1 tablet (1 mg total) by mouth daily. 90 tablet 3  . Calcium Carb-Cholecalciferol (CALCIUM 600 + D PO) Take 1 tablet by mouth 2 (two) times daily.    . cholecalciferol (VITAMIN D) 1000 units tablet Take 2 tablets (2,000 Units total) by mouth daily.    [name] docusate sodium (COLACE) 100 MG capsule Take 100 mg daily as needed by mouth for mild constipation.    . fluticasone (FLONASE) 50 MCG/ACT nasal spray Place 1 spray into both nostrils daily as needed for allergies or rhinitis.     . metoprolol tartrate (LOPRESSOR) 100 MG tablet Take 1 tablet (100 mg total) by mouth 2 (two) times daily. 180 tablet 3  . omeprazole (PRILOSEC) 20 MG capsule TAKE 1 CAPSULE BY MOUTH EVERY DAY 90 capsule 0  . pravastatin (PRAVACHOL) 20 MG  tablet TAKE 1 TABLET BY MOUTH EVERY DAY 90 tablet 3   No current facility-administered medications for this visit.     Review of Systems Review of Systems  Constitutional: Negative.   Respiratory: Negative.   Cardiovascular: Negative.     Blood pressure 136/74, pulse 82, resp. rate 15, height 5' 5"  (1.651 m), weight 284 lb (128.8 kg).  Physical Exam Physical Exam  Constitutional: She is oriented to person, place, and time. She appears well-developed and well-nourished.  Eyes: Conjunctivae are normal. No scleral icterus.  Neck: Neck supple.  Cardiovascular: Normal rate, regular rhythm and normal heart sounds.  Pulmonary/Chest: Effort normal and breath sounds normal. Right breast exhibits no inverted nipple, no mass, no nipple discharge, no skin change and no tenderness. Left breast exhibits no inverted nipple, no mass, no nipple discharge, no skin change and no tenderness.    Lymphadenopathy:    She has no cervical adenopathy.    She has no axillary adenopathy.  Neurological: She is alert and oriented to person, place, and time.  Skin: Skin is warm and dry.    Data Reviewed Bilateral screening mammograms dated July 12, 2018 were reviewed.  No interval change on the left.  Possible increased microcalcifications on the right warranted additional views.  Diagnostic mammogram dated July 27, 2018 reviewed, results as noted below:  IMPRESSION: Development of probably benign calcifications along the surgical scar, anterior to the lumpectomy site in the right breast, for which short-term follow-up is recommended.  Assessment    Dystrophic calcifications related to scar.    Plan  Patient to have a a right breast diagnotic mammogram in six months , no office visit .  She has been asked to call the office the day of the exam so the films can be independently reviewed.  Assuming no new issues at that time, we will plan for a follow-up exam 12 months from now with bilateral  mammograms, likely diagnostic depending upon the April 2020 report.    HPI, Physical Exam, Assessment and Plan have been scribed under the direction and in the presence of Hervey Ard, MD.  Gaspar Cola, CMA  I have completed the exam and reviewed the above documentation for accuracy and completeness.  I agree with the above.  Haematologist has been used and any errors in dictation or transcription are unintentional.  Hervey Ard, M.D., F.A.C.S.   Forest Gleason Chiann Goffredo 08/14/2018, 4:33 PM

## 2018-08-14 ENCOUNTER — Encounter: Payer: Self-pay | Admitting: General Surgery

## 2018-08-22 ENCOUNTER — Ambulatory Visit (INDEPENDENT_AMBULATORY_CARE_PROVIDER_SITE_OTHER): Payer: BLUE CROSS/BLUE SHIELD | Admitting: Family Medicine

## 2018-08-22 ENCOUNTER — Encounter: Payer: Self-pay | Admitting: Family Medicine

## 2018-08-22 VITALS — BP 122/82 | HR 60 | Temp 98.8°F | Ht 65.0 in | Wt 282.0 lb

## 2018-08-22 DIAGNOSIS — K219 Gastro-esophageal reflux disease without esophagitis: Secondary | ICD-10-CM

## 2018-08-22 DIAGNOSIS — Z7189 Other specified counseling: Secondary | ICD-10-CM

## 2018-08-22 DIAGNOSIS — R739 Hyperglycemia, unspecified: Secondary | ICD-10-CM

## 2018-08-22 DIAGNOSIS — I10 Essential (primary) hypertension: Secondary | ICD-10-CM

## 2018-08-22 DIAGNOSIS — Z0000 Encounter for general adult medical examination without abnormal findings: Secondary | ICD-10-CM | POA: Diagnosis not present

## 2018-08-22 DIAGNOSIS — Z170 Estrogen receptor positive status [ER+]: Secondary | ICD-10-CM

## 2018-08-22 DIAGNOSIS — C50311 Malignant neoplasm of lower-inner quadrant of right female breast: Secondary | ICD-10-CM

## 2018-08-22 LAB — BASIC METABOLIC PANEL
BUN: 15 mg/dL (ref 6–23)
CO2: 35 mEq/L — ABNORMAL HIGH (ref 19–32)
Calcium: 9.8 mg/dL (ref 8.4–10.5)
Chloride: 100 mEq/L (ref 96–112)
Creatinine, Ser: 1.01 mg/dL (ref 0.40–1.20)
GFR: 58.63 mL/min — ABNORMAL LOW (ref >60.00)
Glucose, Bld: 113 mg/dL — ABNORMAL HIGH (ref 70–99)
Potassium: 5 mEq/L (ref 3.5–5.1)
Sodium: 140 mEq/L (ref 135–145)

## 2018-08-22 LAB — HEMOGLOBIN A1C: Hgb A1c MFr Bld: 6.5 % (ref 4.6–6.5)

## 2018-08-22 MED ORDER — METOPROLOL TARTRATE 100 MG PO TABS: 100.0000 mg | ORAL_TABLET | Freq: Two times a day (BID) | ORAL | 3 refills | Status: DC

## 2018-08-22 MED ORDER — OMEPRAZOLE 20 MG PO CPDR: DELAYED_RELEASE_CAPSULE | ORAL | 3 refills | Status: DC

## 2018-08-22 NOTE — Patient Instructions (Addendum)
Go to the lab on the way out.  We'll contact you with your lab report. Take care.  Glad to see you.  Don't change your meds for now.  

## 2018-08-22 NOTE — Progress Notes (Signed)
CPE- See plan.  Routine anticipatory guidance given to patient.  See health maintenance.  The possibility exists that previously documented standard health maintenance information may have been brought forward from a previous encounter into this note.  If needed, that same information has been updated to reflect the current situation based on today's encounter.    Tetanus 2013 PNA due at 63.  Shingles shot d/w pt.  Flu shot 10/19 at work. Colonoscopy 2015 Mammogram comes through cancer and surgery clinic.  Pap 2015, d/w pt about q 5 year screening.   DXA prev done 2018 Diet and exercise d/w pt.affected by caring for her parents.  Living will d/w pt. Would have daughter Elsworth Soho if patient were incapacitated. HCV and HIV screening d/w pt. [facility], would have screening done then, ~1990s.   Elevated Cholesterol: Using medications without problems: yes Muscle aches: no Diet compliance: encouraged.   Exercise: encouraged.    Episodic positional numbness in the hands, with sitting in recliner, self resolves o/w.  No weakness.    She has help during the week but she is covering the weekends to care for her parents.  Her father isn't sleeping well at baseline.  She is trying to adjust to the chronic strain of the situation.  She wants to keep them at home.    GERD controlled with PPI.  No ADE on med.    H/o hyperglycemia.  Prev labs d/w pt.   Breast cancer hx per Dr. Bary Castilla.  D/w pt.  I'll defer.     Hypertension:    Using medication without problems or lightheadedness: yes Chest pain with exertion:no Edema:no Short of breath: attributed to deconditioning.   PMH and SH reviewed  Meds, vitals, and allergies reviewed.   ROS: Per HPI.  Unless specifically indicated otherwise in HPI, the patient denies:  General: fever. Eyes: acute vision changes ENT: sore throat Cardiovascular: chest pain Respiratory: SOB GI: vomiting GU:  dysuria Musculoskeletal: acute back pain Derm: acute rash Neuro: acute motor dysfunction Psych: worsening mood Endocrine: polydipsia Heme: bleeding Allergy: hayfever  GEN: nad, alert and oriented HEENT: mucous membranes moist NECK: supple w/o LA CV: rrr. PULM: ctab, no inc wob ABD: soft, +bs EXT: no edema SKIN: no acute rash Normal grip and sensation B hands.

## 2018-08-28 ENCOUNTER — Other Ambulatory Visit: Payer: Self-pay | Admitting: Family Medicine

## 2018-08-28 DIAGNOSIS — E119 Type 2 diabetes mellitus without complications: Secondary | ICD-10-CM | POA: Insufficient documentation

## 2018-08-28 DIAGNOSIS — Z8639 Personal history of other endocrine, nutritional and metabolic disease: Secondary | ICD-10-CM | POA: Insufficient documentation

## 2018-08-28 NOTE — Assessment & Plan Note (Signed)
Living will d/w pt. Would have daughter Tina Delgado designated if patient were incapacitated. 

## 2018-08-28 NOTE — Assessment & Plan Note (Signed)
Controlled with PPI, continue as is.

## 2018-08-28 NOTE — Assessment & Plan Note (Addendum)
Tetanus 2013 PNA due at 63.  Shingles shot d/w pt.  Flu shot 10/19 at work. Colonoscopy 2015 Mammogram comes through cancer and surgery clinic.  Pap 2015, d/w pt about q 5 year screening.   DXA prev done 2018 Diet and exercise d/w pt.affected by caring for her parents.  Living will d/w pt. Would have daughter Elsworth Soho if patient were incapacitated. HCV and HIV screening d/w pt. She [facility], would have screening done then, ~1990s.   The episodic hand numbness that she has appears to be positional.  Discussed.  Update me if symptoms continue.  Normal exam today.

## 2018-08-28 NOTE — Assessment & Plan Note (Signed)
Per surgery clinic.  I will defer.  She agrees.

## 2018-08-28 NOTE — Assessment & Plan Note (Signed)
Previous labs discussed with patient.  Continue work on diet and exercise.

## 2018-08-28 NOTE — Assessment & Plan Note (Signed)
Controlled.  No change in meds.  Continue work on diet and exercise.  Labs discussed with patient.

## 2018-08-28 NOTE — Assessment & Plan Note (Signed)
Continue statin.  Encouraged work on diet and exercise.  This is complicated by her situation caring for family members.

## 2018-08-30 ENCOUNTER — Other Ambulatory Visit: Payer: Self-pay | Admitting: Family Medicine

## 2018-08-30 DIAGNOSIS — E119 Type 2 diabetes mellitus without complications: Secondary | ICD-10-CM

## 2018-09-01 ENCOUNTER — Encounter: Payer: Self-pay | Admitting: Hematology and Oncology

## 2018-09-01 ENCOUNTER — Inpatient Hospital Stay (HOSPITAL_BASED_OUTPATIENT_CLINIC_OR_DEPARTMENT_OTHER): Payer: BLUE CROSS/BLUE SHIELD | Admitting: Hematology and Oncology

## 2018-09-01 ENCOUNTER — Inpatient Hospital Stay: Payer: BLUE CROSS/BLUE SHIELD | Attending: Hematology and Oncology

## 2018-09-01 VITALS — BP 149/85 | HR 60 | Temp 97.6°F | Resp 18 | Wt 287.1 lb

## 2018-09-01 DIAGNOSIS — Z79811 Long term (current) use of aromatase inhibitors: Secondary | ICD-10-CM

## 2018-09-01 DIAGNOSIS — M85851 Other specified disorders of bone density and structure, right thigh: Secondary | ICD-10-CM | POA: Diagnosis not present

## 2018-09-01 DIAGNOSIS — I10 Essential (primary) hypertension: Secondary | ICD-10-CM

## 2018-09-01 DIAGNOSIS — Z803 Family history of malignant neoplasm of breast: Secondary | ICD-10-CM | POA: Diagnosis not present

## 2018-09-01 DIAGNOSIS — C50411 Malignant neoplasm of upper-outer quadrant of right female breast: Secondary | ICD-10-CM | POA: Diagnosis not present

## 2018-09-01 DIAGNOSIS — Z923 Personal history of irradiation: Secondary | ICD-10-CM

## 2018-09-01 DIAGNOSIS — Z9221 Personal history of antineoplastic chemotherapy: Secondary | ICD-10-CM

## 2018-09-01 DIAGNOSIS — Z170 Estrogen receptor positive status [ER+]: Secondary | ICD-10-CM | POA: Diagnosis not present

## 2018-09-01 DIAGNOSIS — Z800 Family history of malignant neoplasm of digestive organs: Secondary | ICD-10-CM

## 2018-09-01 DIAGNOSIS — C50311 Malignant neoplasm of lower-inner quadrant of right female breast: Secondary | ICD-10-CM

## 2018-09-01 DIAGNOSIS — C773 Secondary and unspecified malignant neoplasm of axilla and upper limb lymph nodes: Secondary | ICD-10-CM

## 2018-09-01 LAB — CBC WITH DIFFERENTIAL/PLATELET
Abs Immature Granulocytes: 0.06 10*3/uL (ref 0.00–0.07)
Basophils Absolute: 0.1 10*3/uL (ref 0.0–0.1)
Basophils Relative: 1 %
Eosinophils Absolute: 0.3 10*3/uL (ref 0.0–0.5)
Eosinophils Relative: 3 %
HCT: 43.5 % (ref 36.0–46.0)
Hemoglobin: 14 g/dL (ref 12.0–15.0)
Immature Granulocytes: 1 %
Lymphocytes Relative: 28 %
Lymphs Abs: 2.6 10*3/uL (ref 0.7–4.0)
MCH: 28.1 pg (ref 26.0–34.0)
MCHC: 32.2 g/dL (ref 30.0–36.0)
MCV: 87.2 fL (ref 80.0–100.0)
Monocytes Absolute: 0.8 10*3/uL (ref 0.1–1.0)
Monocytes Relative: 9 %
Neutro Abs: 5.6 10*3/uL (ref 1.7–7.7)
Neutrophils Relative %: 58 %
Platelets: 252 10*3/uL (ref 150–400)
RBC: 4.99 MIL/uL (ref 3.87–5.11)
RDW: 13.3 % (ref 11.5–15.5)
WBC: 9.3 10*3/uL (ref 4.0–10.5)
nRBC: 0 % (ref 0.0–0.2)

## 2018-09-01 LAB — COMPREHENSIVE METABOLIC PANEL
ALT: 25 U/L (ref 0–44)
AST: 21 U/L (ref 15–41)
Albumin: 4 g/dL (ref 3.5–5.0)
Alkaline Phosphatase: 91 U/L (ref 38–126)
Anion gap: 8 (ref 5–15)
BUN: 15 mg/dL (ref 8–23)
CO2: 31 mmol/L (ref 22–32)
Calcium: 9.3 mg/dL (ref 8.9–10.3)
Chloride: 102 mmol/L (ref 98–111)
Creatinine, Ser: 0.81 mg/dL (ref 0.44–1.00)
GFR calc Af Amer: >60 mL/min (ref >60)
GFR calc non Af Amer: >60 mL/min (ref >60)
Glucose, Bld: 99 mg/dL (ref 70–99)
Potassium: 4.2 mmol/L (ref 3.5–5.1)
Sodium: 141 mmol/L (ref 135–145)
Total Bilirubin: 0.3 mg/dL (ref 0.3–1.2)
Total Protein: 7.3 g/dL (ref 6.5–8.1)

## 2018-09-01 NOTE — Progress Notes (Signed)
Patient had should surgery in February (total socket replacement).  Patient saw Dr. Bary Castilla recently after [facility] showed calcifications.  Will see him again in 6 months for followup.

## 2018-09-01 NOTE — Progress Notes (Signed)
[facility] day:  09/01/18  Chief Complaint: Tina Delgado is a 61 y.o. female with a history of stage IIB left breast cancer and osteopenia who is seen for 6 month assessment.  HPI: The patient was last seen in the medical oncology clinic on 02/24/2018.  At that time, she was doing well.  She was s/p LEFT shoulder replacement. Pain in shoulder had resolved. Her only limitation, from a ROM standpoint, was an inability to scratch her back. There were no breast concerns. Exam was stable.  CA27.29 was normal.  Bilateral screening mammogram on 07/12/2018 revealed calcifications near the lumpectomy site.  Diagnostic right mammogram on 07/27/2018 revealed the development of probably benign calcifications along the surgical scar anterior to the lumpectomy site.  Diagnostic mammogram in 6 months was recommended.  She was seen by Dr. Bary Castilla on 08/11/2018.  Notes reviewed.  She was felt to have dystrophic calcifications related to the scar.  Right breast diagnostic mammogram in 6 months was discussed.  During the interim, patient is doing well overall. She denies any acute complaints today. Recovered well from shoulder surgery done in 11/2017. Patient denies that she has experienced any B symptoms. She denies any interval infections. Patient does not verbalize any concerns with regards to her breasts today. Patient performs monthly self breast examinations as recommended.   Patient advises that she maintains an adequate appetite. She is eating well. Weight today is 287 lb 2 oz (130.2 kg), which compared to her last visit to the clinic, represents a 3 pound decrease.     Patient denies pain in the clinic today.   Past Medical History:  Diagnosis Date  . Allergy   . Arthritis    s/p knee injection per ortho  . Breast cancer ([place]) 2014   RT LUMPECTOMY  . Dyspnea   . Elevated glucose 05/2003   106  . GERD (gastroesophageal reflux disease)   .  Hyperlipidemia   . Hypertension   . Malignant neoplasm of upper-outer quadrant of female breast [facility]) July 20, 2013   histologic grade 3 invasive mammary carcinoma, 13 mm,  2/16 nodes positive, ER-positive, PR positive, HER-2/neu not amplified.T1c,N1a  . Mammographic microcalcification   . Morbid obesity ([place])   . Personal history of chemotherapy 2014   BREAST CA  . Personal history of radiation therapy 2014   BREAST CA  . Torn rotator cuff 12/2015   left    Past Surgical History:  Procedure Laterality Date  . BREAST EXCISIONAL BIOPSY Right 06/2013   [facility], clear margins, LN positive  . BREAST SURGERY Right 07-20-13   Wide excision, SLN biopsy, axillary dissection.  . COLONOSCOPY  [id number]  . JOINT REPLACEMENT  07/30/2007   LTKR Dr Derrel Nip  . KNEE ARTHROSCOPY  03/04/2007   left Dr Derrel Nip  . PORTACATH PLACEMENT  2014  . s/p chemotherapy Right    right breast cancer   . s/p radiation therapy Right    Whole breast radiation, right.  [name] TOTAL KNEE ARTHROPLASTY Right 01/19/2017   Procedure: TOTAL KNEE ARTHROPLASTY;  Surgeon: Corky Mull, MD;  Location: [facility] ORS;  Service: Orthopedics;  Laterality: Right;  . TOTAL SHOULDER ARTHROPLASTY Left 2019  . TUBAL LIGATION  ~1986   BTL    Family History  Problem Relation Age of Onset  . Heart disease Mother        MI  . Depression Mother        paranoia  . Hypertension  Mother   . Stroke Mother   . Parkinsonism Mother   . Heart disease Brother        MI PTCA at 18  . Heart disease Father   . Stomach cancer Maternal Grandfather 6  . Breast cancer Cousin   . Colon cancer Paternal Uncle     Social History:  reports that she has never smoked. She has never used smokeless tobacco. She reports that she drinks alcohol. She reports that she does not use drugs.  She lives in [place].  The patient is alone today.  Allergies:  Allergies  Allergen Reactions  . Simvastatin Other (See Comments)    Myalgias.    . Latex Rash and Other  (See Comments)    More adhesive allergy than latex.  Has never been RAST tested. Thinks paper tape is okay.  . Lisinopril Cough  . Sulfonamide Derivatives Hives and Other (See Comments)    REACTION: itching years ago    Current Medications: Current Outpatient Medications  Medication Sig Dispense Refill  . acetaminophen (TYLENOL) 500 MG tablet Take 1,000 mg every 6 (six) hours as needed by mouth for moderate pain or headache.     [name] amoxicillin (AMOXIL) 500 MG capsule Take 2,000 mg See admin instructions by mouth. Take 2000 mg by mouth 1 hour prior to dental procdures    . anastrozole (ARIMIDEX) 1 MG tablet Take 1 tablet (1 mg total) by mouth daily. 90 tablet 3  . Biotin 10000 MCG TABS Take by mouth daily.    . Calcium Carb-Cholecalciferol (CALCIUM 600 + D PO) Take 1 tablet by mouth 2 (two) times daily.    . cholecalciferol (VITAMIN D) 1000 units tablet Take 2 tablets (2,000 Units total) by mouth daily.    [name] docusate sodium (COLACE) 100 MG capsule Take 100 mg daily as needed by mouth for mild constipation.    . fluticasone (FLONASE) 50 MCG/ACT nasal spray Place 1 spray into both nostrils daily as needed for allergies or rhinitis.     . metoprolol tartrate (LOPRESSOR) 100 MG tablet Take 1 tablet (100 mg total) by mouth 2 (two) times daily. 180 tablet 3  . omeprazole (PRILOSEC) 20 MG capsule TAKE 1 CAPSULE BY MOUTH EVERY DAY 90 capsule 3  . pravastatin (PRAVACHOL) 20 MG tablet TAKE 1 TABLET BY MOUTH EVERY DAY 90 tablet 3   No current facility-administered medications for this visit.     Review of Systems  Constitutional: Positive for weight loss (down 3 pounds). Negative for diaphoresis, fever and malaise/fatigue.  HENT: Negative.   Eyes: Negative.   Respiratory: Negative for cough, hemoptysis, sputum production and shortness of breath.   Cardiovascular: Negative for chest pain, palpitations, orthopnea, leg swelling and PND.  Gastrointestinal: Negative for abdominal pain, blood in stool,  constipation, diarrhea, melena, nausea and vomiting.  Genitourinary: Negative for dysuria, frequency, hematuria and urgency.  Musculoskeletal: Positive for joint pain. Negative for back pain, falls and myalgias.       S/p LEFT shoulder replacement and RIGHT knee arthoplasty  Skin: Negative for itching and rash.  Neurological: Negative for dizziness, tremors, weakness and headaches.  Endo/Heme/Allergies: Does not bruise/bleed easily.  Psychiatric/Behavioral: Negative for depression, memory loss and suicidal ideas. The patient is not nervous/anxious and does not have insomnia.   All other systems reviewed and are negative.  Performance status (ECOG): 1 - Symptomatic but completely ambulatory  Vital Signs BP (!) 149/85 (BP Location: Left Arm, Patient Position: Sitting)   Pulse 60   Temp  97.6 F (36.4 C) (Tympanic)   Resp 18   Wt 287 lb 2 oz (130.2 kg)   BMI 47.78 kg/m   Physical Exam  Constitutional: She is oriented to person, place, and time and well-developed, well-nourished, and in no distress. No distress.  HENT:  Head: Normocephalic and atraumatic.  Mouth/Throat: Oropharynx is clear and moist and mucous membranes are normal. No oropharyngeal exudate.  Short gray hair.  Eyes: Pupils are equal, round, and reactive to light. Conjunctivae and EOM are normal. No scleral icterus.  Blue eyes.  Neck: Normal range of motion. Neck supple. No JVD present.  Cardiovascular: Normal rate, regular rhythm, normal heart sounds and intact distal pulses. Exam reveals no gallop and no friction rub.  No murmur heard. Pulmonary/Chest: Effort normal and breath sounds normal. No respiratory distress. She has no wheezes. She has no rales. Right breast exhibits skin change (post operative changes at the 3 o'clock position). Right breast exhibits no inverted nipple, no mass and no nipple discharge. Left breast exhibits skin change (mild fibrocystic changes). Left breast exhibits no inverted nipple, no mass  and no nipple discharge.  Abdominal: Soft. Bowel sounds are normal. She exhibits no distension and no mass. There is no abdominal tenderness.  Musculoskeletal: Normal range of motion. She exhibits no edema or tenderness.  Lymphadenopathy:    She has no cervical adenopathy.    She has no axillary adenopathy.       Right: No inguinal and no supraclavicular adenopathy present.       Left: No inguinal and no supraclavicular adenopathy present.  Neurological: She is alert and oriented to person, place, and time.  Skin: Skin is warm and dry. No rash noted. She is not diaphoretic. No erythema.  Psychiatric: Mood, affect and judgment normal.  Nursing note and vitals reviewed.   Appointment on 09/01/2018  Component Date Value Ref Range Status  . Sodium 09/01/2018 141  135 - 145 mmol/L Final  . Potassium 09/01/2018 4.2  3.5 - 5.1 mmol/L Final  . Chloride 09/01/2018 102  98 - 111 mmol/L Final  . CO2 09/01/2018 31  22 - 32 mmol/L Final  . Glucose, Bld 09/01/2018 99  70 - 99 mg/dL Final  . BUN 09/01/2018 15  8 - 23 mg/dL Final  . Creatinine, Ser 09/01/2018 0.81  0.44 - 1.00 mg/dL Final  . Calcium 09/01/2018 9.3  8.9 - 10.3 mg/dL Final  . Total Protein 09/01/2018 7.3  6.5 - 8.1 g/dL Final  . Albumin 09/01/2018 4.0  3.5 - 5.0 g/dL Final  . AST 09/01/2018 21  15 - 41 U/L Final  . ALT 09/01/2018 25  0 - 44 U/L Final  . Alkaline Phosphatase 09/01/2018 91  38 - 126 U/L Final  . Total Bilirubin 09/01/2018 0.3  0.3 - 1.2 mg/dL Final  . GFR calc non Af Amer 09/01/2018 >60  >60 mL/min Final  . GFR calc Af Amer 09/01/2018 >60  >60 mL/min Final   Comment: (NOTE) The eGFR has been calculated using the CKD EPI equation. This calculation has not been validated in all clinical situations. eGFR's persistently <60 mL/min signify possible Chronic Kidney Disease.   Georgiann Hahn gap 09/01/2018 8  5 - 15 Final   Performed at [facility], [address] [place], [place] [address]  . WBC 09/01/2018 9.3  4.0 -  10.5 K/uL Final  . RBC 09/01/2018 4.99  3.87 - 5.11 MIL/uL Final  . Hemoglobin 09/01/2018 14.0  12.0 - 15.0 g/dL Final  .  HCT 09/01/2018 43.5  36.0 - 46.0 % Final  . MCV 09/01/2018 87.2  80.0 - 100.0 fL Final  . MCH 09/01/2018 28.1  26.0 - 34.0 pg Final  . MCHC 09/01/2018 32.2  30.0 - 36.0 g/dL Final  . RDW 09/01/2018 13.3  11.5 - 15.5 % Final  . Platelets 09/01/2018 252  150 - 400 K/uL Final  . nRBC 09/01/2018 0.0  0.0 - 0.2 % Final  . Neutrophils Relative % 09/01/2018 58  % Final  . Neutro Abs 09/01/2018 5.6  1.7 - 7.7 K/uL Final  . Lymphocytes Relative 09/01/2018 28  % Final  . Lymphs Abs 09/01/2018 2.6  0.7 - 4.0 K/uL Final  . Monocytes Relative 09/01/2018 9  % Final  . Monocytes Absolute 09/01/2018 0.8  0.1 - 1.0 K/uL Final  . Eosinophils Relative 09/01/2018 3  % Final  . Eosinophils Absolute 09/01/2018 0.3  0.0 - 0.5 K/uL Final  . Basophils Relative 09/01/2018 1  % Final  . Basophils Absolute 09/01/2018 0.1  0.0 - 0.1 K/uL Final  . Immature Granulocytes 09/01/2018 1  % Final  . Abs Immature Granulocytes 09/01/2018 0.06  0.00 - 0.07 K/uL Final   Performed at [facility], [address] [place], [place] [address]    Assessment:  Tina Delgado is a 62 y.o. female with a history of stage IIB right breast cancer s/p lumpectomy on 05/2013.  Pathology revealed a grade III 1.3 cm lesion with 2 of 16 lymph nodes positive.  Pathologic stage was T1cN1M0 lesion.  Tumor was ER+/PR+ and Her2/neu- .  She enrolled on NSABP B47 clinical trial.  She received 4 cycles of TC (shortened course due to adverse events) from 07/28/2013 to 10/27/2013.  She received breast radiation from 12/18/2013 to 02/06/2014.  In 2015, she took Femara (letrozole) x 1 month.  She was switched to Arimidex secondary to dysgeusia (metallic taste).  She is tolerating Arimidex well.  Bilateral mammogram on  07/09/2017 revealed no evidence of malignancy within either breast.  There were stable postsurgical  changes within the right breast.  Bilateral screening mammogram on 07/12/2018 revealed calcifications near the lumpectomy site.  Diagnostic right mammogram on 07/27/2018 revealed the development of probably benign calcifications along the surgical scar anterior to the lumpectomy site.   CA27.29 has been followed: 14.7 on 02/21/2014, 12.4 on 05/09/2015, 10.6 on 10/07/2015, 18.1 on 02/10/2016, 15.0 on 08/12/2016, 18.5 on 02/08/2017, 17.4 on 08/12/2017, 11.2 on 02/24/2018, and 9.4 on 09/01/2018.  Bone density study on 11/15/2014 revealed osteopenia with a T-score of -1.2 in right femur.  T-score of -1.4 in the right femur was noted on 03/16/2017 study.  She is on calcium and vitamin D.  She underwent right knee arthroplasty for osteoarthritis on 01/19/2017.  Symptomatically, she is doing well overall.  She continues to recover following her left shoulder replacement done in February 2019.  Patient denies any B symptoms or breast concerns.  Exam is grossly stable.  WBC 9300 ([place] 5600).  Plan: 1. Labs today:  CBC with diff, CMP, CA27.29. 2. Stage IIB right breast cancer  Doing well overall.  No breast concerns.  CA27.29 stable at 9.4 U/mL.   Review interval mammograms- suspect calcifications s/p lumpectomy.  Discuss plan for diagnostic bilateral mammogram in 6 months (01/26/2019).  Continues on adjuvant endocrine therapy (letrozole) as prescribed.  Discuss BCI (breast cancer index) testing on the patient's original tumor that is stored in the pathology lab. Testing will be used to assess the benefit  of extended (5 versus 10 years) of adjuvant hormonal therapy. Patient provided with written information on the testing today, and was encouraged to contact their insurance company to inquire about coverage and out of pocket costs.  3. Osteopenia  Discuss the use of Prolia to prevent bone thinning/loss associated with endocrine therapy. Side effects of this medication reviewed.   Declined  intervention when presented as an option in the past, and continues to decline.   She is not on an oral bisphosphonate  Patient is aware of her increased risk of osteoporotic fracture.  Encouraged to take calcium 1200 mg and vitamin D 800 IU daily.  4. RTC in 6 months for MD assessment and labs (CBC with diff, CMP, CA27.29).   ADDENDUM: Patient returned call to the clinic on 09/09/2018 to discuss BCI testing further.  Patient has spoken with her insurance company, and has been advised that they will cover testing.  BCI requisition form completed by clinical staff and sent to [facility].  Per [employer], prior authorization may be required after submission.Honor Loh, NP  09/01/2018, 2:56 PM   I saw and evaluated the patient, participating in the key portions of the service and reviewing pertinent diagnostic studies and records.  I reviewed the nurse practitioner's note and agree with the findings and the plan.  A few questions were asked by the patient and answered.   Nolon Stalls, MD 09/01/2018,2:56 PM

## 2018-09-02 LAB — CANCER ANTIGEN 27.29: CA 27.29: 9.4 U/mL (ref 0.0–38.6)

## 2018-09-09 ENCOUNTER — Telehealth: Payer: Self-pay | Admitting: *Deleted

## 2018-09-09 NOTE — Telephone Encounter (Signed)
Patient called and reports that she checked with her insurance company and they told her the BCI testing is covered, but will need a prior authorization prior to performing the test.

## 2018-09-20 ENCOUNTER — Encounter: Payer: BLUE CROSS/BLUE SHIELD | Attending: Family Medicine | Admitting: *Deleted

## 2018-09-20 ENCOUNTER — Encounter: Payer: Self-pay | Admitting: *Deleted

## 2018-09-20 VITALS — BP 130/84 | Ht 65.0 in | Wt 284.3 lb

## 2018-09-20 DIAGNOSIS — Z6841 Body Mass Index (BMI) 40.0 and over, adult: Secondary | ICD-10-CM | POA: Insufficient documentation

## 2018-09-20 DIAGNOSIS — Z713 Dietary counseling and surveillance: Secondary | ICD-10-CM | POA: Insufficient documentation

## 2018-09-20 DIAGNOSIS — E119 Type 2 diabetes mellitus without complications: Secondary | ICD-10-CM

## 2018-09-20 NOTE — Patient Instructions (Signed)
Check blood sugars 2 x day before breakfast and 2 hrs after one meal - 3 x week Bring blood sugar records to the next class  Call your doctor for a prescription for:  1. Meter strips (type) One Touch Verio checking  3 times per week  2. Lancets (type) One Touch Delica checking  3     times per week  Exercise: Begin walking  for  10  minutes 3  days a week  Eat 3 meals day,  1-2 snacks a day Space meals 4-6 hours apart Don't skip Avoid sugar sweetened drinks (soda)  Return for classes on:

## 2018-09-21 NOTE — Progress Notes (Signed)
Diabetes Self-Management Education  Visit Type: First/Initial  Appt. Start Time: 1600 Appt. End Time: 2353  09/20/2018  [name], identified by name and date of birth, is a 62 y.o. female with a diagnosis of Diabetes: Type 2.   ASSESSMENT  Blood pressure 130/84, height 5\' 5"  (1.651 m), weight 284 lb 4.8 oz (129 kg). Body mass index is 47.31 kg/m.  Diabetes Self-Management Education - 09/20/18 1729      Visit Information   Visit Type  First/Initial      Initial Visit   Diabetes Type  Type 2    Are you currently following a meal plan?  No    Are you taking your medications as prescribed?  Yes    Date Diagnosed  1 month ago      Health Coping   How would you rate your overall health?  Good      Psychosocial Assessment   Patient Belief/Attitude about Diabetes  Other (comment)   "no different"   Self-care barriers  None    Self-management support  Doctor's office;Friends    Patient Concerns  Nutrition/Meal planning;Glycemic Control;Weight Control;Healthy Lifestyle    Special Needs  None    Preferred Learning Style  Hands on    Learning Readiness  Contemplating    How often do you need to have someone help you when you read instructions, pamphlets, or other written materials from your doctor or pharmacy?  1 - Never    What is the last grade level you completed in school?  Associate degree      Pre-Education Assessment   Patient understands the diabetes disease and treatment process.  Needs Instruction    Patient understands incorporating nutritional management into lifestyle.  Needs Instruction    Patient undertands incorporating physical activity into lifestyle.  Needs Instruction    Patient understands using medications safely.  Needs Instruction    Patient understands monitoring blood glucose, interpreting and using results  Needs Instruction    Patient understands prevention, detection, and treatment of acute complications.  Needs Instruction    Patient  understands prevention, detection, and treatment of chronic complications.  Needs Instruction    Patient understands how to develop strategies to address psychosocial issues.  Needs Instruction    Patient understands how to develop strategies to promote health/change behavior.  Needs Instruction      Complications   Last HgB A1C per patient/outside source  6.5 %   08/22/18   How often do you check your blood sugar?  0 times/day (not testing)   Provided One Touch Verio Flex meter and instructed on use. BG upon return demonstration was 130 mg/dL at 4:55 pm - 3 1/2 hrs pp.    Have you had a dilated eye exam in the past 12 months?  Yes    Have you had a dental exam in the past 12 months?  Yes    Are you checking your feet?  Yes    How many days per week are you checking your feet?  7      Dietary Intake   Breakfast  breakfast bar, oatmeal, [place] toast    Snack (morning)  fruit or piece of candy    Lunch  eats out for most lunches - big meal of day - [place] with rice, hamburger weekly, grilled chicken, salads, pinto beans, greens, squash, corn, green beans, broccoli, brussel sprouts    Dinner  skips or has chicken salad, sweet potato, chicken and pasta, soup  Beverage(s)  water, coffee, regular sodas      Exercise   Exercise Type  ADL's      Patient Education   Previous Diabetes Education  No    Disease state   Definition of diabetes, type 1 and 2, and the diagnosis of diabetes;Factors that contribute to the development of diabetes    Nutrition management   Role of diet in the treatment of diabetes and the relationship between the three main macronutrients and blood glucose level;Reviewed blood glucose goals for pre and post meals and how to evaluate the patients' food intake on their blood glucose level.    Physical activity and exercise   Role of exercise on diabetes management, blood pressure control and cardiac health.    Monitoring  Taught/evaluated SMBG meter.;Purpose and frequency  of SMBG.;Taught/discussed recording of test results and interpretation of SMBG.;Identified appropriate SMBG and/or A1C goals.    Chronic complications  Relationship between chronic complications and blood glucose control    Psychosocial adjustment  Role of stress on diabetes;Identified and addressed patients feelings and concerns about diabetes      Individualized Goals (developed by patient)   Reducing Risk  Improve blood sugars Prevent diabetes complications Lose weight Lead a healthier lifestyle     Outcomes   Expected Outcomes  Demonstrated interest in learning. Expect positive outcomes       Individualized Plan for Diabetes Self-Management Training:   Learning Objective:  Patient will have a greater understanding of diabetes self-management. Patient education plan is to attend individual and/or group sessions per assessed needs and concerns.   Plan:   Patient Instructions  Check blood sugars 2 x day before breakfast and 2 hrs after one meal - 3 x week Bring blood sugar records to the next class Call your doctor for a prescription for:  1. Meter strips (type) One Touch Verio checking  3 times per week  2. Lancets (type) One Touch Delica checking  3     times per week Exercise: Begin walking  for  10  minutes 3  days a week Eat 3 meals day,  1-2 snacks a day Space meals 4-6 hours apart Don't skip Avoid sugar sweetened drinks (soda)  Expected Outcomes:  Demonstrated interest in learning. Expect positive outcomes  Education material provided:  General Meal Planning Guidelines Simple Meal Plan Meter - One Touch Verio Flex  If problems or questions, patient to contact team via:  Johny Drilling, [place], [place], CDE [phone]  Future DSME appointment:  September 26, 2018 for Diabetes Class 1

## 2018-09-26 ENCOUNTER — Encounter: Payer: BLUE CROSS/BLUE SHIELD | Attending: Family Medicine | Admitting: Dietician

## 2018-09-26 ENCOUNTER — Encounter: Payer: Self-pay | Admitting: Dietician

## 2018-09-26 VITALS — Wt 283.1 lb

## 2018-09-26 DIAGNOSIS — E119 Type 2 diabetes mellitus without complications: Secondary | ICD-10-CM | POA: Diagnosis not present

## 2018-09-26 DIAGNOSIS — Z6841 Body Mass Index (BMI) 40.0 and over, adult: Secondary | ICD-10-CM | POA: Diagnosis not present

## 2018-09-26 DIAGNOSIS — Z713 Dietary counseling and surveillance: Secondary | ICD-10-CM | POA: Diagnosis not present

## 2018-09-26 NOTE — Progress Notes (Signed)
Appt. Start Time: 5:30pm Appt. End Time: 8:30pm  Class 1 Diabetes Overview - define DM; state own type of DM; identify functions of pancreas and insulin; define insulin deficiency vs insulin resistance  Psychosocial - identify DM as a source of stress; state the effects of stress on BG control  Nutritional Management - describe effects of food on blood glucose; identify sources of carbohydrate, protein and fat; verbalize the importance of balance meals in controlling blood glucose  Exercise - describe the effects of exercise on blood glucose and importance of regular exercise in controlling diabetes; state a plan for personal exercise; verbalize contraindications for exercise  Self-Monitoring - state importance of SMBG; use SMBG results to effectively manage diabetes; identify importance of regular HbA1C testing and goals for results  Acute Complications - recognize hyperglycemia and hypoglycemia with causes and effects; identify blood glucose results as high, low or in control; list steps in treating and preventing high and low blood glucose  Chronic Complications/Foot, Skin, Eye Dental Care - identify possible long-term complications of diabetes (retinopathy, neuropathy, nephropathy, cardiovascular disease, infections); state importance of daily self-foot exams; describe how to examine feet and what to look for; explain appropriate eye and dental care  Lifestyle Changes/Goals & Health/Community Resources - state benefits of making appropriate lifestyle changes; identify habits that need to change (meals, tobacco, alcohol); identify strategies to reduce risk factors for personal health  Pregnancy/Sexual Health - define gestational diabetes; state importance of good blood glucose control and birth control prior to pregnancy; state importance of good blood glucose control in preventing sexual problems (impotence, vaginal dryness, infections, loss of desire); state relationship of blood glucose  control and pregnancy outcome; describe risk of maternal and fetal complications  Teaching Materials Used: Class 1 Slides/Notebook Diabetes Booklet ID Card  Medic Alert/Medic ID Forms Sleep Evaluation Exercise Handout Planning a Balanced Meal Goals for Class 1

## 2018-09-27 ENCOUNTER — Encounter: Payer: Self-pay | Admitting: Family Medicine

## 2018-09-27 ENCOUNTER — Other Ambulatory Visit: Payer: Self-pay | Admitting: *Deleted

## 2018-09-27 MED ORDER — GLUCOSE BLOOD VI STRP: ORAL_STRIP | 3 refills | Status: DC

## 2018-09-27 MED ORDER — ACCU-CHEK FASTCLIX LANCETS MISC: 3 refills | Status: DC

## 2018-10-10 ENCOUNTER — Encounter: Payer: BLUE CROSS/BLUE SHIELD | Admitting: Dietician

## 2018-10-10 ENCOUNTER — Encounter: Payer: Self-pay | Admitting: Dietician

## 2018-10-10 VITALS — BP 160/102 | Ht 65.0 in | Wt 280.9 lb

## 2018-10-10 DIAGNOSIS — E119 Type 2 diabetes mellitus without complications: Secondary | ICD-10-CM | POA: Diagnosis not present

## 2018-10-10 DIAGNOSIS — Z713 Dietary counseling and surveillance: Secondary | ICD-10-CM | POA: Diagnosis not present

## 2018-10-10 DIAGNOSIS — Z6841 Body Mass Index (BMI) 40.0 and over, adult: Secondary | ICD-10-CM | POA: Diagnosis not present

## 2018-10-10 NOTE — Progress Notes (Signed)

## 2018-10-12 ENCOUNTER — Other Ambulatory Visit: Payer: Self-pay | Admitting: *Deleted

## 2018-10-12 MED ORDER — ACCU-CHEK FASTCLIX LANCETS MISC: 3 refills | Status: DC

## 2018-10-12 NOTE — Telephone Encounter (Signed)
Pt states pharmacy did not receive Rx for lancets; resent as requested

## 2018-11-07 ENCOUNTER — Encounter: Payer: BLUE CROSS/BLUE SHIELD | Attending: Family Medicine | Admitting: Dietician

## 2018-11-07 ENCOUNTER — Encounter: Payer: Self-pay | Admitting: Dietician

## 2018-11-07 VITALS — Wt 285.4 lb

## 2018-11-07 DIAGNOSIS — Z6841 Body Mass Index (BMI) 40.0 and over, adult: Secondary | ICD-10-CM | POA: Diagnosis not present

## 2018-11-07 DIAGNOSIS — E119 Type 2 diabetes mellitus without complications: Secondary | ICD-10-CM | POA: Insufficient documentation

## 2018-11-07 DIAGNOSIS — Z713 Dietary counseling and surveillance: Secondary | ICD-10-CM | POA: Insufficient documentation

## 2018-11-07 NOTE — Progress Notes (Signed)

## 2018-11-09 ENCOUNTER — Encounter: Payer: Self-pay | Admitting: *Deleted

## 2018-11-23 ENCOUNTER — Encounter: Payer: Self-pay | Admitting: Hematology and Oncology

## 2018-11-29 ENCOUNTER — Encounter: Payer: Self-pay | Admitting: Family Medicine

## 2018-11-29 ENCOUNTER — Ambulatory Visit: Payer: BLUE CROSS/BLUE SHIELD | Admitting: Family Medicine

## 2018-11-29 VITALS — BP 126/82 | HR 63 | Temp 98.0°F | Ht 65.0 in

## 2018-11-29 DIAGNOSIS — Z8639 Personal history of other endocrine, nutritional and metabolic disease: Secondary | ICD-10-CM | POA: Diagnosis not present

## 2018-11-29 DIAGNOSIS — E119 Type 2 diabetes mellitus without complications: Secondary | ICD-10-CM

## 2018-11-29 LAB — MICROALBUMIN / CREATININE URINE RATIO
Creatinine,U: 121.4 mg/dL
Microalb Creat Ratio: 0.6 mg/g (ref 0.0–30.0)
Microalb, Ur: <0.7 mg/dL (ref 0.0–1.9)

## 2018-11-29 NOTE — Progress Notes (Signed)
She is still caring for her parents and this is a strain for her.  She is trying to address their care needs.  I thanked her for her effort.    H/o DM2 by a1c.  A1c improved to 6.3.  D/w pt.  Due for MALB.   No DM2 meds.  She is working on diet.   Sugar has been ~110s usually fasting.  Some occ R heel tingling.   See after visit summary.  Meds, vitals, and allergies reviewed.   ROS: Per HPI unless specifically indicated in ROS section   GEN: nad, alert and oriented HEENT: mucous membranes moist NECK: supple w/o LA CV: rrr. PULM: ctab, no inc wob ABD: soft, +bs EXT: no edema SKIN: no acute rash  Diabetic foot exam: Normal inspection No skin breakdown No calluses  Normal DP pulses Normal sensation to light touch and monofilament Nails normal

## 2018-11-29 NOTE — Patient Instructions (Addendum)
Go to the lab on the way out.  We'll contact you with your lab report. Take care.  Glad to see you.   Keep working on diet and recheck A1c about 4 months.  Please send me a copy of your labs.  We'll go from there.

## 2018-11-30 NOTE — Assessment & Plan Note (Signed)
Not diabetic by A1c, now at 6.3.  Discussed with patient about checking microalbumin.  No diabetic medicines at this point otherwise.  Continue work on diet and exercise.  Recheck periodically.  It may be that she has some compressive symptoms causing episodic numbness in her foot.  This does not seem typical for diabetic neuropathy.  She can monitor in the meantime.  She has a normal exam at this point.  We talked about her caregiver strain.  She is trying to get extra help with the house and will update me as needed.

## 2018-12-06 ENCOUNTER — Encounter: Payer: Self-pay | Admitting: Family Medicine

## 2018-12-06 LAB — LAB REPORT - SCANNED: A1c: 6.3

## 2018-12-27 ENCOUNTER — Other Ambulatory Visit: Payer: Self-pay

## 2018-12-27 DIAGNOSIS — C50311 Malignant neoplasm of lower-inner quadrant of right female breast: Secondary | ICD-10-CM

## 2018-12-27 DIAGNOSIS — Z170 Estrogen receptor positive status [ER+]: Secondary | ICD-10-CM

## 2018-12-30 DIAGNOSIS — M19012 Primary osteoarthritis, left shoulder: Secondary | ICD-10-CM | POA: Diagnosis not present

## 2018-12-30 DIAGNOSIS — M12812 Other specific arthropathies, not elsewhere classified, left shoulder: Secondary | ICD-10-CM | POA: Diagnosis not present

## 2018-12-30 DIAGNOSIS — Z96612 Presence of left artificial shoulder joint: Secondary | ICD-10-CM | POA: Diagnosis not present

## 2019-01-26 ENCOUNTER — Ambulatory Visit
Admission: RE | Admit: 2019-01-26 | Discharge: 2019-01-26 | Disposition: A | Discharge status: Home / Self Care | Payer: BLUE CROSS/BLUE SHIELD | Source: Ambulatory Visit | Attending: General Surgery | Admitting: General Surgery

## 2019-01-26 ENCOUNTER — Other Ambulatory Visit: Payer: Self-pay

## 2019-01-26 DIAGNOSIS — Z170 Estrogen receptor positive status [ER+]: Secondary | ICD-10-CM | POA: Insufficient documentation

## 2019-01-26 DIAGNOSIS — C50311 Malignant neoplasm of lower-inner quadrant of right female breast: Secondary | ICD-10-CM | POA: Diagnosis not present

## 2019-01-26 DIAGNOSIS — R921 Mammographic calcification found on diagnostic imaging of breast: Secondary | ICD-10-CM | POA: Diagnosis not present

## 2019-01-27 ENCOUNTER — Other Ambulatory Visit: Payer: BLUE CROSS/BLUE SHIELD

## 2019-02-27 ENCOUNTER — Other Ambulatory Visit: Payer: BLUE CROSS/BLUE SHIELD

## 2019-02-27 ENCOUNTER — Ambulatory Visit: Payer: BLUE CROSS/BLUE SHIELD | Admitting: Hematology and Oncology

## 2019-03-10 ENCOUNTER — Encounter: Payer: Self-pay | Admitting: Family Medicine

## 2019-03-24 ENCOUNTER — Inpatient Hospital Stay: Payer: BLUE CROSS/BLUE SHIELD | Attending: Hematology and Oncology

## 2019-03-24 ENCOUNTER — Telehealth: Payer: Self-pay | Admitting: Hematology and Oncology

## 2019-03-24 ENCOUNTER — Other Ambulatory Visit: Payer: Self-pay

## 2019-03-24 DIAGNOSIS — C50311 Malignant neoplasm of lower-inner quadrant of right female breast: Secondary | ICD-10-CM

## 2019-03-24 DIAGNOSIS — Z170 Estrogen receptor positive status [ER+]: Secondary | ICD-10-CM

## 2019-03-24 DIAGNOSIS — C50411 Malignant neoplasm of upper-outer quadrant of right female breast: Secondary | ICD-10-CM | POA: Diagnosis not present

## 2019-03-24 LAB — COMPREHENSIVE METABOLIC PANEL
ALT: 22 U/L (ref 0–44)
AST: 19 U/L (ref 15–41)
Albumin: 3.7 g/dL (ref 3.5–5.0)
Alkaline Phosphatase: 94 U/L (ref 38–126)
Anion gap: 9 (ref 5–15)
BUN: 16 mg/dL (ref 8–23)
CO2: 27 mmol/L (ref 22–32)
Calcium: 9 mg/dL (ref 8.9–10.3)
Chloride: 101 mmol/L (ref 98–111)
Creatinine, Ser: 0.76 mg/dL (ref 0.44–1.00)
GFR calc Af Amer: >60 mL/min (ref >60)
GFR calc non Af Amer: >60 mL/min (ref >60)
Glucose, Bld: 121 mg/dL — ABNORMAL HIGH (ref 70–99)
Potassium: 4 mmol/L (ref 3.5–5.1)
Sodium: 137 mmol/L (ref 135–145)
Total Bilirubin: 0.7 mg/dL (ref 0.3–1.2)
Total Protein: 7.1 g/dL (ref 6.5–8.1)

## 2019-03-24 LAB — CBC WITH DIFFERENTIAL/PLATELET
Abs Immature Granulocytes: 0.07 10*3/uL (ref 0.00–0.07)
Basophils Absolute: 0.1 10*3/uL (ref 0.0–0.1)
Basophils Relative: 1 %
Eosinophils Absolute: 0.2 10*3/uL (ref 0.0–0.5)
Eosinophils Relative: 2 %
HCT: 43.9 % (ref 36.0–46.0)
Hemoglobin: 14.6 g/dL (ref 12.0–15.0)
Immature Granulocytes: 1 %
Lymphocytes Relative: 24 %
Lymphs Abs: 2.3 10*3/uL (ref 0.7–4.0)
MCH: 28.7 pg (ref 26.0–34.0)
MCHC: 33.3 g/dL (ref 30.0–36.0)
MCV: 86.4 fL (ref 80.0–100.0)
Monocytes Absolute: 0.6 10*3/uL (ref 0.1–1.0)
Monocytes Relative: 6 %
Neutro Abs: 6.5 10*3/uL (ref 1.7–7.7)
Neutrophils Relative %: 66 %
Platelets: 239 10*3/uL (ref 150–400)
RBC: 5.08 MIL/uL (ref 3.87–5.11)
RDW: 13.3 % (ref 11.5–15.5)
WBC: 9.7 10*3/uL (ref 4.0–10.5)
nRBC: 0 % (ref 0.0–0.2)

## 2019-03-24 NOTE — Progress Notes (Signed)
[facility]  [address], [address] [place], [place] [address] Phone: [phone]  Fax: [phone]   Telemedicine Office Visit:  03/27/2019  Referring physician: Tonia Ghent, MD  I connected with Tina Delgado on 03/27/2019 at 1:30 PM by videoconferencing and verified that I was speaking with the correct person using 2 identifiers.  The patient was at home.  I discussed the limitations, risk, security and privacy concerns of performing an evaluation and management service by videoconferencing and the availability of in person appointments.  I also discussed with the patient that there may be a patient responsible charge related to this service.  The patient expressed understanding and agreed to proceed.   Chief Complaint: Tina Delgado is a 63 y.o. female with a history of stage IIB left breast cancer and osteopenia who is seen for 6 month assessment.  HPI: The patient was last seen in the oncology clinic on 09/01/2018. At that time, she was doing well overall.  She continued to recover following her left shoulder replacement done in February 2019. Patient denied any B symptoms or breast concerns.  Exam was grossly stable.  WBC 9300 ([place] 5600).  BCI testing on 10/07/2018 revealed a 13.1% (95% CI: 7.6%-18.3%) risk of late recurrence (years 5-10) and a low likelihood of benefit.  She met with Karolee Stamps, MD for management of her diabetes on 09/26/2018.   Right diagnostic mammogram on 01/26/2019 revealed probably benign calcifications in the anterior third of the medial right breast.  Follow-up mammogram in 6 months was recommended.   Labs on 03/24/2019 reviewed: CBC and CMP unremarkable. CA27.29 was 9.8.   During the interim, she reports she is "fine, no problems." She notes her weight is stable, possibly down a few pounds. She denies any hot flashes or night sweats. She does monthly breast exams. She denies any further issues or pain with her  shoulder.  She recently lost her mother, who had been sick for a long time.   She continues on Arimidex. Her last bone density scan was on 03/16/2017.    Past Medical History:  Diagnosis Date  . Allergy   . Arthritis    s/p knee injection per ortho  . Breast cancer ([place]) 2014   RT LUMPECTOMY  . Diabetes mellitus without complication ([place])   . Dyspnea   . Elevated glucose 05/2003   106  . GERD (gastroesophageal reflux disease)   . Hyperlipidemia   . Hypertension   . Malignant neoplasm of upper-outer quadrant of female breast [facility]) July 20, 2013   histologic grade 3 invasive mammary carcinoma, 13 mm,  2/16 nodes positive, ER-positive, PR positive, HER-2/neu not amplified.T1c,N1a  . Mammographic microcalcification   . Morbid obesity ([place])   . Personal history of chemotherapy 2014   BREAST CA  . Personal history of radiation therapy 2014   BREAST CA  . Torn rotator cuff 12/2015   left    Past Surgical History:  Procedure Laterality Date  . BREAST EXCISIONAL BIOPSY Right 06/2013   [facility], clear margins, LN positive  . BREAST SURGERY Right 07-20-13   Wide excision, SLN biopsy, axillary dissection.  . COLONOSCOPY  [id number]  . JOINT REPLACEMENT  07/30/2007   LTKR Dr Derrel Nip  . KNEE ARTHROSCOPY  03/04/2007   left Dr Derrel Nip  . PORTACATH PLACEMENT  2014  . s/p chemotherapy Right    right breast cancer   . s/p radiation therapy Right    Whole breast radiation, right.  [name]  TOTAL KNEE ARTHROPLASTY Right 01/19/2017   Procedure: TOTAL KNEE ARTHROPLASTY;  Surgeon: Corky Mull, MD;  Location: [facility] ORS;  Service: Orthopedics;  Laterality: Right;  . TOTAL SHOULDER ARTHROPLASTY Left 2019  . TUBAL LIGATION  ~1986   BTL    Family History  Problem Relation Age of Onset  . Heart disease Mother        MI  . Depression Mother        paranoia  . Hypertension Mother   . Stroke Mother   . Parkinsonism Mother   . Heart disease Brother        MI PTCA at 52  . Heart disease Father   .  Stomach cancer Maternal Grandfather 43  . Diabetes Maternal Grandfather   . Breast cancer Cousin   . Colon cancer Paternal Uncle   . Diabetes Maternal Aunt   . Diabetes Maternal Uncle   . Diabetes Maternal Uncle     Social History:  reports that she has never smoked. She has never used smokeless tobacco. She reports current alcohol use. She reports that she does not use drugs. She lives in [place]. She is still working. The patient is alone today.  Participants in the patient's visit and their role in the encounter included the patient and Waymon Budge, RN today.  The intake visit was provided by Waymon Budge, RN.  Allergies:  Allergies  Allergen Reactions  . Simvastatin Other (See Comments)    Myalgias.    . Latex Rash and Other (See Comments)    More adhesive allergy than latex.  Has never been RAST tested. Thinks paper tape is okay.  . Lisinopril Cough  . Sulfonamide Derivatives Hives and Other (See Comments)    REACTION: itching years ago    Current Medications: Current Outpatient Medications  Medication Sig Dispense Refill  . acetaminophen (TYLENOL) 500 MG tablet Take 1,000 mg every 6 (six) hours as needed by mouth for moderate pain or headache.     [name] amoxicillin (AMOXIL) 500 MG capsule Take 2,000 mg See admin instructions by mouth. Take 2000 mg by mouth 1 hour prior to dental procdures    . anastrozole (ARIMIDEX) 1 MG tablet Take 1 tablet (1 mg total) by mouth daily. 90 tablet 3  . Calcium Carb-Cholecalciferol (CALCIUM 600 + D PO) Take 1 tablet by mouth 2 (two) times daily.    [name] docusate sodium (COLACE) 100 MG capsule Take 100 mg daily as needed by mouth for mild constipation.    . fluticasone (FLONASE) 50 MCG/ACT nasal spray Place 1 spray into both nostrils daily as needed for allergies or rhinitis.     . Loratadine (CLARITIN PO) Take 1 tablet by mouth daily as needed.    . metoprolol tartrate (LOPRESSOR) 100 MG tablet Take 1 tablet (100 mg total) by mouth 2  (two) times daily. 180 tablet 3  . omeprazole (PRILOSEC) 20 MG capsule TAKE 1 CAPSULE BY MOUTH EVERY DAY 90 capsule 3  . pravastatin (PRAVACHOL) 20 MG tablet TAKE 1 TABLET BY MOUTH EVERY DAY 90 tablet 3  . ACCU-CHEK FASTCLIX LANCETS MISC Use as instructed to check blood sugar 3 times per week. (Patient not taking: Reported on 03/27/2019) 100 each 3  . glucose blood (ACCU-CHEK GUIDE) test strip Use as instructed to check blood sugar 3 times per week. (Patient not taking: Reported on 03/27/2019) 100 each 3   No current facility-administered medications for this visit.     Review of Systems  Constitutional: Negative.  Negative  for diaphoresis, fever, malaise/fatigue and weight loss (stable).       She is doing "fine."  HENT: Negative.  Negative for congestion, hearing loss, nosebleeds, sinus pain and sore throat.   Eyes: Negative.  Negative for blurred vision.  Respiratory: Negative.  Negative for cough, hemoptysis, sputum production and shortness of breath.   Cardiovascular: Negative.  Negative for chest pain, palpitations, orthopnea, leg swelling and PND.  Gastrointestinal: Negative.  Negative for abdominal pain, blood in stool, constipation, diarrhea, melena, nausea and vomiting.  Genitourinary: Negative.  Negative for dysuria, frequency, hematuria and urgency.  Musculoskeletal: Positive for joint pain (knee). Negative for back pain, falls and myalgias.       S/p LEFT shoulder replacement and RIGHT knee arthoplasty  Skin: Negative.  Negative for itching and rash.  Neurological: Negative.  Negative for dizziness, sensory change, weakness and headaches.  Endo/Heme/Allergies: Negative.  Does not bruise/bleed easily.  Psychiatric/Behavioral: Negative.  Negative for depression and memory loss. The patient is not nervous/anxious and does not have insomnia.   All other systems reviewed and are negative.   Performance status (ECOG): 1  Physical Exam  Constitutional: She is oriented to person, place,  and time. She appears well-developed and well-nourished. No distress.  HENT:  Short, gray hair.  Eyes: Conjunctivae and EOM are normal. No scleral icterus.  Glasses.  Neurological: She is alert and oriented to person, place, and time.  Psychiatric: She has a normal mood and affect. Her behavior is normal. Judgment and thought content normal.  She is tearful at times.  Nursing note reviewed.     No visits with results within 3 Day(s) from this visit.  Latest known visit with results is:  Appointment on 03/24/2019  Component Date Value Ref Range Status  . CA 27.29 03/24/2019 9.8  0.0 - 38.6 U/mL Final   Comment: (NOTE) Siemens Centaur Immunochemiluminometric Methodology [facility]) Values obtained with different assay methods or kits cannot be used interchangeably. Results cannot be interpreted as absolute evidence of the presence or absence of malignant disease. Performed At: [facility] [place], [place] [id number] Rush Farmer MD RJ:[id number]   . Sodium 03/24/2019 137  135 - 145 mmol/L Final  . Potassium 03/24/2019 4.0  3.5 - 5.1 mmol/L Final  . Chloride 03/24/2019 101  98 - 111 mmol/L Final  . CO2 03/24/2019 27  22 - 32 mmol/L Final  . Glucose, Bld 03/24/2019 121* 70 - 99 mg/dL Final  . BUN 03/24/2019 16  8 - 23 mg/dL Final  . Creatinine, Ser 03/24/2019 0.76  0.44 - 1.00 mg/dL Final  . Calcium 03/24/2019 9.0  8.9 - 10.3 mg/dL Final  . Total Protein 03/24/2019 7.1  6.5 - 8.1 g/dL Final  . Albumin 03/24/2019 3.7  3.5 - 5.0 g/dL Final  . AST 03/24/2019 19  15 - 41 U/L Final  . ALT 03/24/2019 22  0 - 44 U/L Final  . Alkaline Phosphatase 03/24/2019 94  38 - 126 U/L Final  . Total Bilirubin 03/24/2019 0.7  0.3 - 1.2 mg/dL Final  . GFR calc non Af Amer 03/24/2019 >60  >60 mL/min Final  . GFR calc Af Amer 03/24/2019 >60  >60 mL/min Final  . Anion gap 03/24/2019 9  5 - 15 Final   Performed at [facility], [address], [place], [place] [address]   . WBC 03/24/2019 9.7  4.0 - 10.5 K/uL Final  . RBC 03/24/2019 5.08  3.87 - 5.11 MIL/uL Final  .  Hemoglobin 03/24/2019 14.6  12.0 - 15.0 g/dL Final  . HCT 03/24/2019 43.9  36.0 - 46.0 % Final  . MCV 03/24/2019 86.4  80.0 - 100.0 fL Final  . MCH 03/24/2019 28.7  26.0 - 34.0 pg Final  . MCHC 03/24/2019 33.3  30.0 - 36.0 g/dL Final  . RDW 03/24/2019 13.3  11.5 - 15.5 % Final  . Platelets 03/24/2019 239  150 - 400 K/uL Final  . nRBC 03/24/2019 0.0  0.0 - 0.2 % Final  . Neutrophils Relative % 03/24/2019 66  % Final  . Neutro Abs 03/24/2019 6.5  1.7 - 7.7 K/uL Final  . Lymphocytes Relative 03/24/2019 24  % Final  . Lymphs Abs 03/24/2019 2.3  0.7 - 4.0 K/uL Final  . Monocytes Relative 03/24/2019 6  % Final  . Monocytes Absolute 03/24/2019 0.6  0.1 - 1.0 K/uL Final  . Eosinophils Relative 03/24/2019 2  % Final  . Eosinophils Absolute 03/24/2019 0.2  0.0 - 0.5 K/uL Final  . Basophils Relative 03/24/2019 1  % Final  . Basophils Absolute 03/24/2019 0.1  0.0 - 0.1 K/uL Final  . Immature Granulocytes 03/24/2019 1  % Final  . Abs Immature Granulocytes 03/24/2019 0.07  0.00 - 0.07 K/uL Final   Performed at [facility], [address], [place], [place] [address]    Assessment:  Tina Delgado is a 63 y.o. female with a history of stage IIB right breast cancer s/p lumpectomy on 05/2013.  Pathology revealed a grade III 1.3 cm lesion with 2 of 16 lymph nodes positive.  Pathologic stage was T1cN1M0 lesion.  Tumor was ER+/PR+ and Her2/neu- .  She enrolled on NSABP B47 clinical trial.  She received 4 cycles of TC (shortened course due to adverse events) from 07/28/2013 to 10/27/2013.  She received breast radiation from 12/18/2013 to 02/06/2014.  In 2015, she took Femara (letrozole) x 1 month.  She was switched to Arimidex secondary to dysgeusia (metallic taste).  She is tolerating Arimidex well.  BCI testing on 10/07/2018 revealed a 13.1% (95% CI: 7.6%-18.3%) risk of late recurrence  (years 5-10) and a low likelihood of benefit.  Diagnostic right mammogram on 07/27/2018 revealed the development of probably benign calcifications along the surgical scar anterior to the lumpectomy site.  Diagnostic right mammogram on 01/26/2019 revealed probably benign calcifications in the anterior third of the medial right breast.  Follow-up mammogram in 6 months was recommended  CA27.29 has been followed: 14.7 on 02/21/2014, 12.4 on 05/09/2015, 10.6 on 10/07/2015, 18.1 on 02/10/2016, 15.0 on 08/12/2016, 18.5 on 02/08/2017, 17.4 on 08/12/2017, 11.2 on 02/24/2018, 9.4 on 09/01/2018, and 9.8 on 03/24/2019.  Bone density study on 11/15/2014 revealed osteopenia with a T-score of -1.2 in right femur.  T-score of -1.4 in the right femur was noted on 03/16/2017 study.  She is on calcium and vitamin D.  She underwent right knee arthroplasty for osteoarthritis on 01/19/2017.  Symptomatically, she is doing well.  She denies any breast concerns.  CA27.29 is normal.  Plan: 1.   Review labs from 03/24/2019 2.   Stage IIB right breast cancer Clinically, she is doing well. She performs monthly exams and denies any concerns. CA27.29 is 9.8 (normal). Review mammogram from 01/26/2019- likely benign calcifications. Schedule right diagnostic mammogram 07/28/2019. Review BCI testing- low likelihood of benefit of extended adjuvant endocrine therapy.  Discuss plan for 5 years of adjuvant endocrine therapy.  Mail copy of BCI results to patient.  3. Osteopenia  Continue calcium and  vitamin D.  Bone density study late (due 03/17/2019).    Schedule next available bone density. 4.   RTC in 6 months for MD assessment, labs (CBC with diff, CMP, CA27.29), review of mammogram and bone density.   I discussed the assessment and treatment plan with the patient.  The patient was provided an opportunity to ask questions and all were answered.  The patient agreed with the plan and demonstrated an understanding of the  instructions.  The patient was advised to call back or seek an in person evaluation if the symptoms worsen or if the condition fails to improve as anticipated.   Nolon Stalls, MD, PhD  03/27/2019, 1:30 PM  I, Molly Dorshimer, am acting as Education administrator for [employer]. Mike Gip, MD, PhD.  I, Melissa C. Mike Gip, MD, have reviewed the above documentation for accuracy and completeness, and I agree with the above.

## 2019-03-25 LAB — CANCER ANTIGEN 27.29: CA 27.29: 9.8 U/mL (ref 0.0–38.6)

## 2019-03-27 ENCOUNTER — Other Ambulatory Visit: Payer: BLUE CROSS/BLUE SHIELD

## 2019-03-27 ENCOUNTER — Inpatient Hospital Stay: Payer: BC Managed Care – PPO | Attending: Hematology and Oncology | Admitting: Hematology and Oncology

## 2019-03-27 DIAGNOSIS — M85851 Other specified disorders of bone density and structure, right thigh: Secondary | ICD-10-CM

## 2019-03-27 DIAGNOSIS — Z79811 Long term (current) use of aromatase inhibitors: Secondary | ICD-10-CM

## 2019-03-27 DIAGNOSIS — Z170 Estrogen receptor positive status [ER+]: Secondary | ICD-10-CM | POA: Diagnosis not present

## 2019-03-27 DIAGNOSIS — C50311 Malignant neoplasm of lower-inner quadrant of right female breast: Secondary | ICD-10-CM | POA: Diagnosis not present

## 2019-03-27 NOTE — Progress Notes (Signed)
Confirmed name, DOB, and address. Denies any concerns at this time.  

## 2019-04-03 ENCOUNTER — Other Ambulatory Visit: Payer: Self-pay

## 2019-04-03 ENCOUNTER — Ambulatory Visit
Admission: RE | Admit: 2019-04-03 | Discharge: 2019-04-03 | Disposition: A | Discharge status: Home / Self Care | Payer: BC Managed Care – PPO | Source: Ambulatory Visit | Attending: Hematology and Oncology | Admitting: Hematology and Oncology

## 2019-04-03 DIAGNOSIS — M85851 Other specified disorders of bone density and structure, right thigh: Secondary | ICD-10-CM

## 2019-04-03 DIAGNOSIS — Z780 Asymptomatic menopausal state: Secondary | ICD-10-CM | POA: Diagnosis not present

## 2019-04-03 DIAGNOSIS — Z13820 Encounter for screening for osteoporosis: Secondary | ICD-10-CM | POA: Diagnosis not present

## 2019-04-09 IMAGING — MG MM DIGITAL DIAGNOSTIC UNILAT*R*
7 series · 8 of 15 positions shown · non-contrast
Comparison: Previous exam(s).

CLINICAL DATA: History of treated right breast cancer, status post
lumpectomy. Calcifications were seen near the lumpectomy site in the
right inner breast on most recent screening mammography.

EXAM:
DIGITAL DIAGNOSTIC RIGHT MAMMOGRAM WITH CAD

[R CC (1 of 2)]
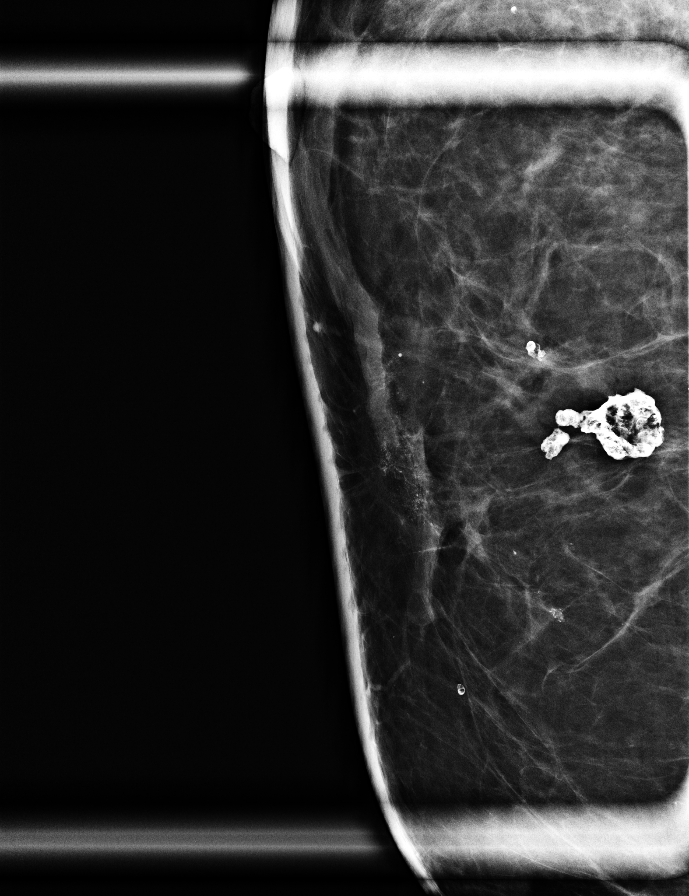

[R CC (2 of 2)]
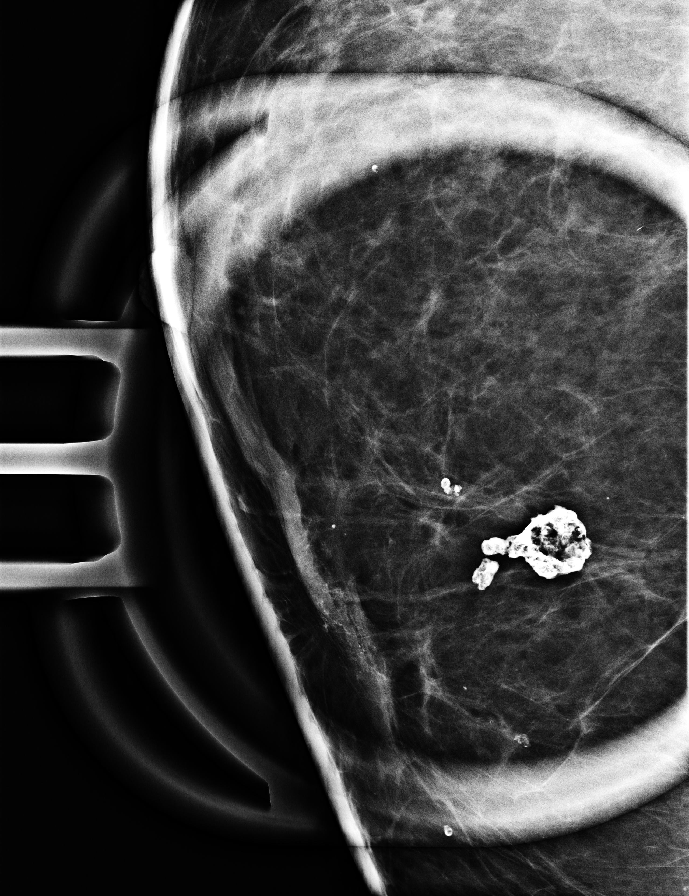

[R ML]
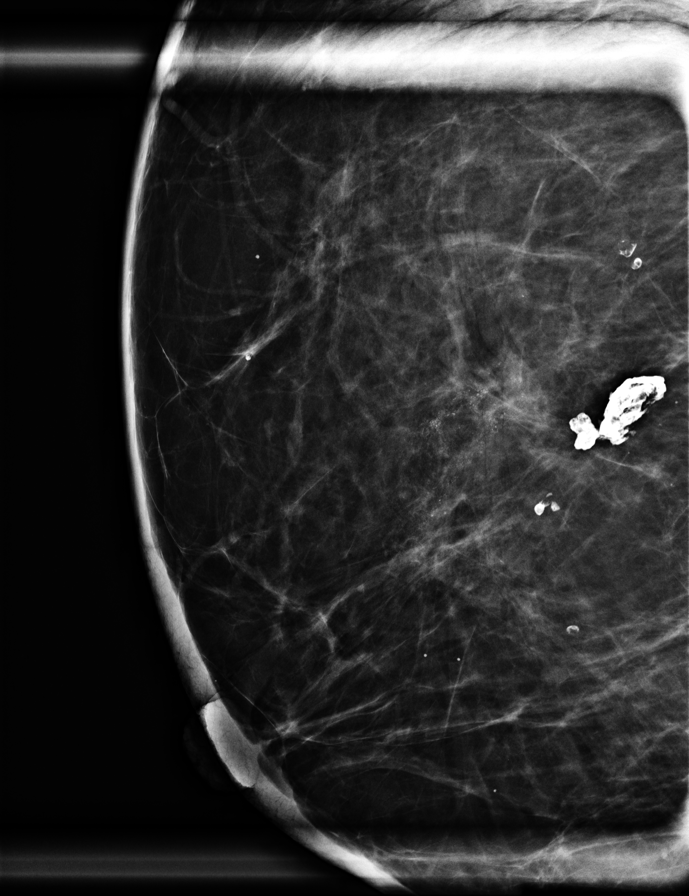

[R CC synth-2D (1 of 2)]
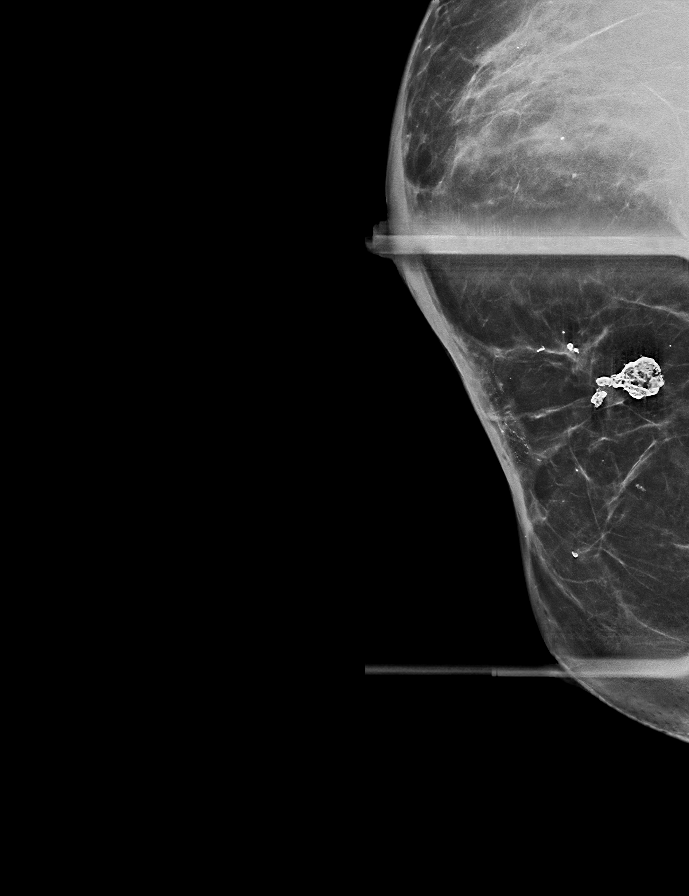

[R CC synth-2D (2 of 2)]
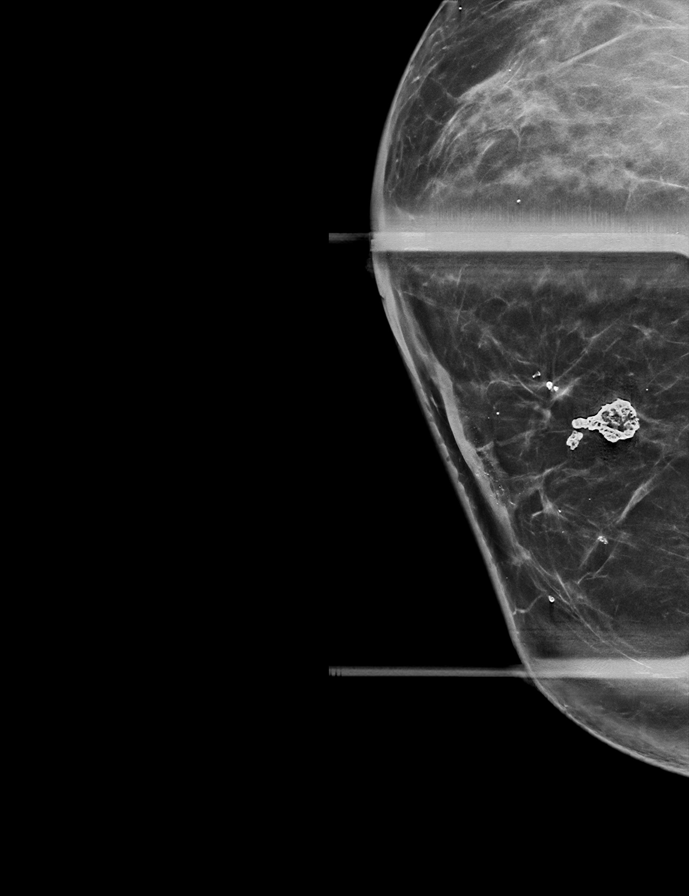

[R CC tomo · 2 of 69 frames shown (1 of 2)]
[frame 23/69]
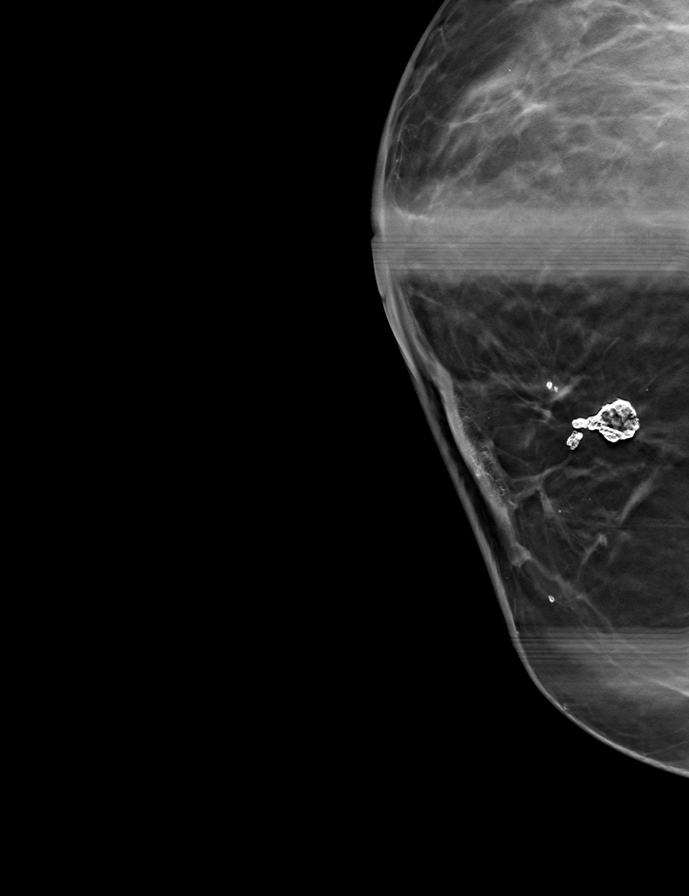
[frame 35/69]
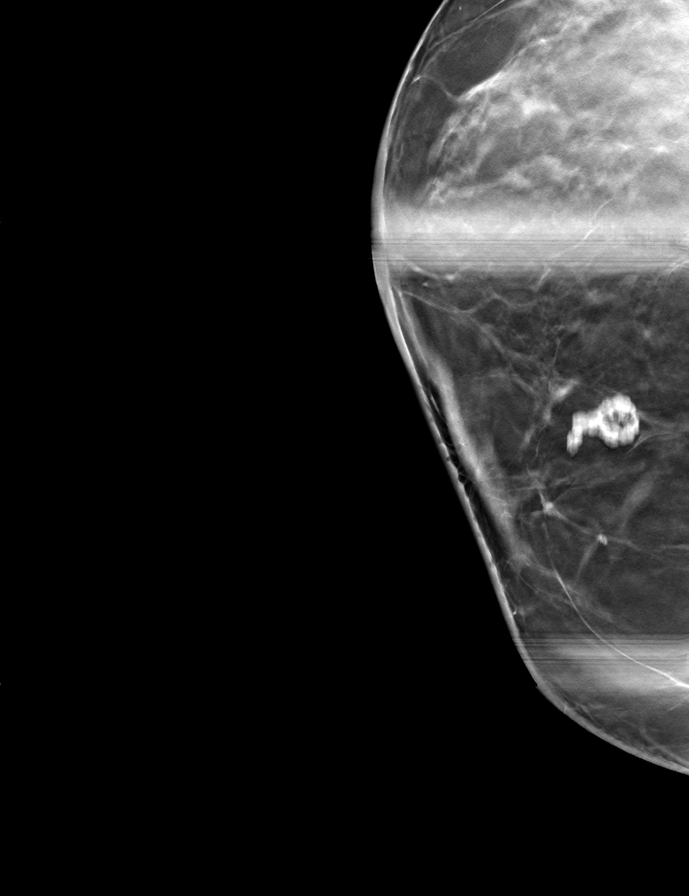

[R CC tomo (2 of 2) · tomo slice 33/64.0]
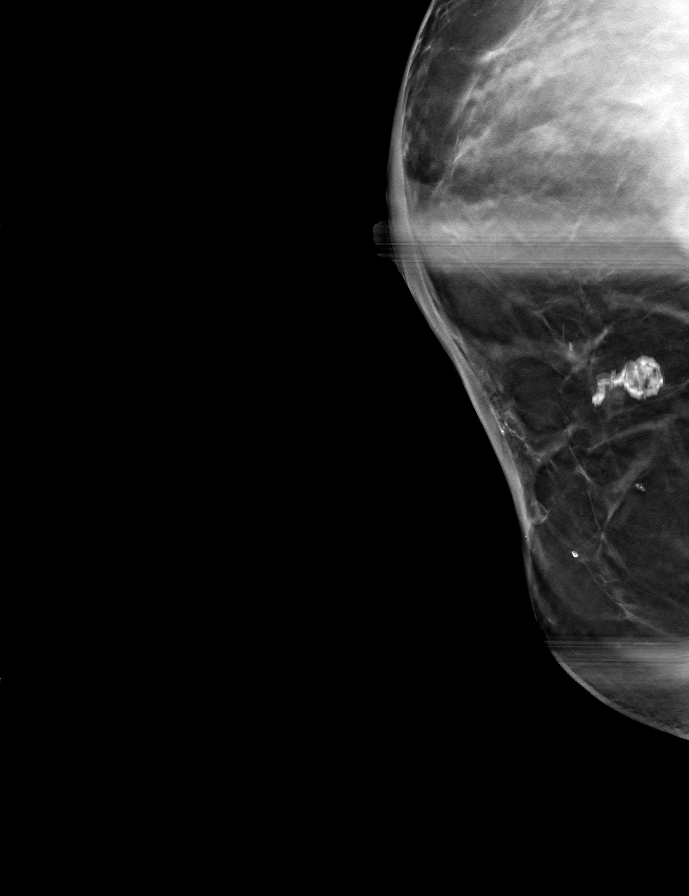

[8 of 15 positions shown; findings below may reference images not displayed]

ACR Breast Density Category b: There are scattered areas of
fibroglandular density.
FINDINGS: Additional spot-compression 3D and magnification 2D images of the
right lumpectomy site were performed. Tangential images were also
obtained. The calcifications seen anterior to the lumpectomy site
seem to follow the skin scarring which indents into the breast
tissue, and therefore likely represent developing benign process,
either dystrophic calcifications or developing fat necrosis. Careful
review on short-term follow-up is warranted.

Mammographic images were processed with CAD.
IMPRESSION: Development of probably benign calcifications along the surgical
scar, anterior to the lumpectomy site in the right breast, for which
short-term follow-up is recommended.

RECOMMENDATION:
Diagnostic mammogram of the right breast in 6 months.
(Code:[id number])

I have discussed the findings and recommendations with the patient.
Results were also provided in writing at the conclusion of the
visit. If applicable, a reminder letter will be sent to the patient
regarding the next appointment.

BI-RADS CATEGORY  3: Probably benign.

## 2019-04-23 ENCOUNTER — Encounter: Payer: Self-pay | Admitting: Hematology and Oncology

## 2019-05-12 ENCOUNTER — Telehealth: Payer: Self-pay | Admitting: *Deleted

## 2019-05-12 DIAGNOSIS — Z9189 Other specified personal risk factors, not elsewhere classified: Secondary | ICD-10-CM

## 2019-05-12 NOTE — Telephone Encounter (Signed)
Patient left a voicemail stating that she found out last night that her dad's sitter's daughter has been exposed to covid and quarantined.  Patient wants to know if Dr. Damita Dunnings thinks that she should be tested?

## 2019-05-12 NOTE — Telephone Encounter (Signed)
Patient given testing site information.

## 2019-05-12 NOTE — Telephone Encounter (Signed)
Given the situation, reasonable to get tested.  Please direct her to a site for testing.  I put in the order.  Thanks.

## 2019-05-15 ENCOUNTER — Other Ambulatory Visit: Payer: Self-pay | Admitting: Family Medicine

## 2019-05-15 DIAGNOSIS — Z20822 Contact with and (suspected) exposure to covid-19: Secondary | ICD-10-CM

## 2019-05-15 DIAGNOSIS — R6889 Other general symptoms and signs: Secondary | ICD-10-CM | POA: Diagnosis not present

## 2019-05-16 ENCOUNTER — Encounter: Payer: Self-pay | Admitting: General Surgery

## 2019-05-18 LAB — NOVEL CORONAVIRUS, NAA: SARS-CoV-2, NAA: NOT DETECTED

## 2019-05-19 ENCOUNTER — Ambulatory Visit (AMBULATORY_SURGERY_CENTER): Payer: Self-pay

## 2019-05-19 ENCOUNTER — Other Ambulatory Visit: Payer: Self-pay

## 2019-05-19 VITALS — Ht 65.0 in | Wt 285.0 lb

## 2019-05-19 DIAGNOSIS — Z86010 Personal history of colonic polyps: Secondary | ICD-10-CM

## 2019-05-19 MED ORDER — NA SULFATE-K SULFATE-MG SULF 17.5-3.13-1.6 GM/177ML PO SOLN: 1.0000 | Freq: Once | ORAL | 0 refills | Status: AC

## 2019-05-19 NOTE — Progress Notes (Signed)
Denies allergies to eggs or soy products. Denies complication of anesthesia or sedation. Denies use of weight loss medication. Denies use of O2.   Emmi instructions given for colonoscopy.  Pre-Visit was conducted by phone due to Covid 19. Instructions were reviewed and mailed to patients confirmed home address. A 15.00 coupon for Suprep was given to the patient. Patient was encouraged to call if she had any questions regarding instructions.

## 2019-05-22 ENCOUNTER — Telehealth: Payer: Self-pay

## 2019-05-22 NOTE — Telephone Encounter (Signed)
Left a message to inform the patiet that we received a refill request from  [facility] for Anastrozole 1 mg # 90 with 3 refills. I wanted to know how long has she been taking the medication and when did she start the medication. I will reach out to the patient again tomorrow to see if she need a refill on this medication.

## 2019-05-23 ENCOUNTER — Telehealth: Payer: Self-pay

## 2019-05-23 NOTE — Telephone Encounter (Signed)
Spoke with the patient and she states she don't want a refill on the the Anastrozole 1 mg daily . The patient was understanding and agreeable.

## 2019-05-25 ENCOUNTER — Telehealth: Payer: Self-pay | Admitting: Gastroenterology

## 2019-05-25 NOTE — Telephone Encounter (Signed)

## 2019-05-26 ENCOUNTER — Ambulatory Visit (AMBULATORY_SURGERY_CENTER): Payer: BC Managed Care – PPO | Admitting: Gastroenterology

## 2019-05-26 ENCOUNTER — Encounter: Payer: Self-pay | Admitting: Gastroenterology

## 2019-05-26 ENCOUNTER — Other Ambulatory Visit: Payer: Self-pay

## 2019-05-26 VITALS — BP 156/48 | HR 61 | Temp 97.8°F | Resp 11 | Ht 65.0 in | Wt 285.0 lb

## 2019-05-26 DIAGNOSIS — D122 Benign neoplasm of ascending colon: Secondary | ICD-10-CM | POA: Diagnosis not present

## 2019-05-26 DIAGNOSIS — D120 Benign neoplasm of cecum: Secondary | ICD-10-CM

## 2019-05-26 DIAGNOSIS — Z1211 Encounter for screening for malignant neoplasm of colon: Secondary | ICD-10-CM | POA: Diagnosis not present

## 2019-05-26 DIAGNOSIS — Z8371 Family history of colonic polyps: Secondary | ICD-10-CM | POA: Diagnosis not present

## 2019-05-26 DIAGNOSIS — Z86010 Personal history of colonic polyps: Secondary | ICD-10-CM

## 2019-05-26 DIAGNOSIS — D123 Benign neoplasm of transverse colon: Secondary | ICD-10-CM | POA: Diagnosis not present

## 2019-05-26 MED ORDER — SODIUM CHLORIDE 0.9 % IV SOLN: 500.0000 mL | Freq: Once | INTRAVENOUS | Status: DC

## 2019-05-26 NOTE — Progress Notes (Signed)
Pt's states no medical or surgical changes since previsit or office visit. 

## 2019-05-26 NOTE — Progress Notes (Signed)
Called to room to assist during endoscopic procedure.  Patient ID and intended procedure confirmed with present staff. Received instructions for my participation in the procedure from the performing physician.  

## 2019-05-26 NOTE — Progress Notes (Signed)
Report to PACU, RN, vss, BBS= Clear.  

## 2019-05-26 NOTE — Patient Instructions (Signed)
Handout given for polyps.  No Aspirin, ibuprofen, naproxen or other NSAIDS for 2 weeks after polyp removal.  YOU HAD AN ENDOSCOPIC PROCEDURE TODAY AT [facility]:   Refer to the procedure report that was given to you for any specific questions about what was found during the examination.  If the procedure report does not answer your questions, please call your gastroenterologist to clarify.  If you requested that your care partner not be given the details of your procedure findings, then the procedure report has been included in a sealed envelope for you to review at your convenience later.  YOU SHOULD EXPECT: Some feelings of bloating in the abdomen. Passage of more gas than usual.  Walking can help get rid of the air that was put into your GI tract during the procedure and reduce the bloating. If you had a lower endoscopy (such as a colonoscopy or flexible sigmoidoscopy) you may notice spotting of blood in your stool or on the toilet paper. If you underwent a bowel prep for your procedure, you may not have a normal bowel movement for a few days.  Please Note:  You might notice some irritation and congestion in your nose or some drainage.  This is from the oxygen used during your procedure.  There is no need for concern and it should clear up in a day or so.  SYMPTOMS TO REPORT IMMEDIATELY:   Following lower endoscopy (colonoscopy or flexible sigmoidoscopy):  Excessive amounts of blood in the stool  Significant tenderness or worsening of abdominal pains  Swelling of the abdomen that is new, acute  Fever of 100F or higher  For urgent or emergent issues, a gastroenterologist can be reached at any hour by calling [phone].   DIET:  We do recommend a small meal at first, but then you may proceed to your regular diet.  Drink plenty of fluids but you should avoid alcoholic beverages for 24 hours.  ACTIVITY:  You should plan to take it easy for the rest of today and you  should NOT DRIVE or use heavy machinery until tomorrow (because of the sedation medicines used during the test).    FOLLOW UP: Our staff will call the number listed on your records 48-72 hours following your procedure to check on you and address any questions or concerns that you may have regarding the information given to you following your procedure. If we do not reach you, we will leave a message.  We will attempt to reach you two times.  During this call, we will ask if you have developed any symptoms of COVID 19. If you develop any symptoms (ie: fever, flu-like symptoms, shortness of breath, cough etc.) before then, please call [phone].  If you test positive for Covid 19 in the 2 weeks post procedure, please call and report this information to [place].    If any biopsies were taken you will be contacted by phone or by letter within the next 1-3 weeks.  Please call us at [phone] if you have not heard about the biopsies in 3 weeks.    SIGNATURES/CONFIDENTIALITY: You and/or your care partner have signed paperwork which will be entered into your electronic medical record.  These signatures attest to the fact that that the information above on your After Visit Summary has been reviewed and is understood.  Full responsibility of the confidentiality of this discharge information lies with you and/or your care-partner.

## 2019-05-26 NOTE — Progress Notes (Signed)
[facility] [place], [place]  Had covid test in [facility]-negative

## 2019-05-26 NOTE — Op Note (Signed)
[place] Patient Name: Tina Delgado Procedure Date: 05/26/2019 2:55 PM MRN: [id number] Endoscopist: Ladene Artist , MD Age: 63 Referring MD:  Date of Birth: 1955-12-15 Gender: Female Account #: [id number] Procedure:                Colonoscopy Indications:              Surveillance: Personal history of adenomatous                            polyps on last colonoscopy 5 years ago. Family                            history of colon polyps. Medicines:                Monitored Anesthesia Care Procedure:                Pre-Anesthesia Assessment:                           - Prior to the procedure, a History and Physical                            was performed, and patient medications and                            allergies were reviewed. The patient's tolerance of                            previous anesthesia was also reviewed. The risks                            and benefits of the procedure and the sedation                            options and risks were discussed with the patient.                            All questions were answered, and informed consent                            was obtained. Prior Anticoagulants: The patient has                            taken no previous anticoagulant or antiplatelet                            agents. ASA Grade Assessment: III - A patient with                            severe systemic disease. After reviewing the risks                            and benefits, the patient was deemed in  satisfactory condition to undergo the procedure.                           After obtaining informed consent, the colonoscope                            was passed under direct vision. Throughout the                            procedure, the patient's blood pressure, pulse, and                            oxygen saturations were monitored continuously. The                            Colonoscope was introduced through  the anus and                            advanced to the the cecum, identified by                            appendiceal orifice and ileocecal valve. The                            ileocecal valve, appendiceal orifice, and rectum                            were photographed. The quality of the bowel                            preparation was adequate. The colonoscopy was                            performed without difficulty. The patient tolerated                            the procedure well. Scope In: 2:59:16 PM Scope Out: 3:27:25 PM Scope Withdrawal Time: 0 hours 26 minutes 10 seconds  Total Procedure Duration: 0 hours 28 minutes 9 seconds  Findings:                 The perianal and digital rectal examinations were                            normal.                           Five semi-pedunculated polyps were found in the                            transverse colon. The polyps were 8 to 12 mm in                            size. These polyps were removed with a hot snare.  Resection and retrieval were complete.                           Three sessile polyps were found in the transverse                            colon, ascending colon and cecum. The polyps were 6                            to 7 mm in size. These polyps were removed with a                            cold snare. Resection and retrieval were complete.                           A 4 mm polyp was found in the ascending colon. The                            polyp was sessile. The polyp was removed with a                            cold biopsy forceps. Resection and retrieval were                            complete.                           The exam was otherwise without abnormality on                            direct and retroflexion views. Complications:            No immediate complications. Estimated blood loss:                            None. Estimated Blood Loss:     Estimated blood loss:  none. Impression:               - Five 8 to 12 mm polyps in the transverse colon,                            removed with a hot snare. Resected and retrieved.                           - Three 6 to 7 mm polyps in the transverse colon,                            in the ascending colon and in the cecum, removed                            with a cold snare. Resected and retrieved.                           - One  4 mm polyp in the ascending colon, removed                            with a cold biopsy forceps. Resected and retrieved.                           - The examination was otherwise normal on direct                            and retroflexion views. Recommendation:           - Repeat colonoscopy likely in 2-3 years for                            surveillance pending pathology review with a more                            extensive bowel prep.                           - Patient has a contact number available for                            emergencies. The signs and symptoms of potential                            delayed complications were discussed with the                            patient. Return to normal activities tomorrow.                            Written discharge instructions were provided to the                            patient.                           - Resume previous diet.                           - Continue present medications.                           - Await pathology results.                           - No aspirin, ibuprofen, naproxen, or other                            non-steroidal anti-inflammatory drugs for 2 weeks                            after polyp removal. Ladene Artist, MD 05/26/2019 3:36:28 PM This report has been signed electronically.

## 2019-05-30 ENCOUNTER — Telehealth: Payer: Self-pay | Admitting: *Deleted

## 2019-05-30 NOTE — Telephone Encounter (Signed)
  Follow up Call-  Call back number 05/26/2019  Post procedure Call Back phone  # [phone]  Permission to leave phone message Yes  Some recent data might be hidden     Patient questions:  Do you have a fever, pain , or abdominal swelling? No. Pain Score  0 *  Have you tolerated food without any problems? Yes.    Have you been able to return to your normal activities? Yes.    Do you have any questions about your discharge instructions: Diet   No. Medications  No. Follow up visit  No.  Do you have questions or concerns about your Care? No.  Actions: * If pain score is 4 or above: No action needed, pain <4.  1. Have you developed a fever since your procedure? no  2.   Have you had an respiratory symptoms (SOB or cough) since your procedure? no  3.   Have you tested positive for COVID 19 since your procedure no  4.   Have you had any family members/close contacts diagnosed with the COVID 19 since your procedure?  no   If yes to any of these questions please route to Joylene John, RN and Alphonsa Gin, Therapist, sports.

## 2019-06-02 ENCOUNTER — Encounter: Payer: Self-pay | Admitting: Gastroenterology

## 2019-06-27 ENCOUNTER — Telehealth: Payer: Self-pay

## 2019-06-27 NOTE — Telephone Encounter (Signed)
Returned patient's call and was advised that she was actually calling about her dad's test results. Results given as instructed by Dr. Damita Dunnings.

## 2019-06-27 NOTE — Telephone Encounter (Signed)
[place] [facility] - Client Nonclinical Telephone Record AccessNurse Client [place] [facility] - Client Client Site [place] Physician Renford Dills - MD Contact Type Call Who Is Calling Patient / Member / Family / Caregiver Caller Name Loraine Phone Number [phone] Call Type Message Only Information Provided Reason for Call Returning a Call from the Office Initial Tina states she missed a call from the office for lab results. Additional Comment Call Closed By: Renne Crigler Transaction Date/Time: 06/26/2019 5:39:19 PM (ET)

## 2019-07-14 ENCOUNTER — Ambulatory Visit
Admission: RE | Admit: 2019-07-14 | Discharge: 2019-07-14 | Disposition: A | Discharge status: Home / Self Care | Payer: Medicare Other | Source: Ambulatory Visit | Attending: Hematology and Oncology | Admitting: Hematology and Oncology

## 2019-07-14 DIAGNOSIS — R928 Other abnormal and inconclusive findings on diagnostic imaging of breast: Secondary | ICD-10-CM | POA: Diagnosis not present

## 2019-07-14 DIAGNOSIS — Z170 Estrogen receptor positive status [ER+]: Secondary | ICD-10-CM | POA: Diagnosis not present

## 2019-07-14 DIAGNOSIS — C50311 Malignant neoplasm of lower-inner quadrant of right female breast: Secondary | ICD-10-CM | POA: Insufficient documentation

## 2019-07-18 ENCOUNTER — Other Ambulatory Visit: Payer: Self-pay

## 2019-07-18 ENCOUNTER — Ambulatory Visit: Payer: BC Managed Care – PPO | Admitting: Surgery

## 2019-07-18 ENCOUNTER — Encounter: Payer: Self-pay | Admitting: Surgery

## 2019-07-18 VITALS — BP 130/74 | HR 76 | Temp 97.8°F | Ht 68.0 in | Wt 294.0 lb

## 2019-07-18 DIAGNOSIS — C50311 Malignant neoplasm of lower-inner quadrant of right female breast: Secondary | ICD-10-CM

## 2019-07-18 DIAGNOSIS — Z170 Estrogen receptor positive status [ER+]: Secondary | ICD-10-CM | POA: Diagnosis not present

## 2019-07-18 NOTE — Patient Instructions (Signed)
The patient has been asked to return to the office in one year with a bilateral diagnostic mammogram. 

## 2019-07-18 NOTE — Progress Notes (Signed)
07/18/2019  History of Present Illness: Tina Delgado is a 63 y.o. female s/p right breast lumpectomy and SLNBx and axillary dissection on 06/2013.  She's also s/p chemotherapy and radiation.  She presents for follow up.  Most recently, she had a mammogram on 06/2018 which showed new calcifications in the lumpectomy area.  Follow up mammogram on 01/2019 showed these to be stable, possibly being benign fat necrosis.  A last mammogram was done on 07/14/19, showing stable probably benign calcifications in the right breast.  She denies having any issues on either side, particularly denies any masses, skin changes, or nipple changes.  She does self exams.  Past Medical History: Past Medical History:  Diagnosis Date  . Allergy   . Arthritis    s/p knee injection per ortho  . Breast cancer (HCC) 2014   RT LUMPECTOMY  . Depression   . Diabetes mellitus without complication (HCC)   . Dyspnea   . Elevated glucose 05/2003   106  . GERD (gastroesophageal reflux disease)   . Hyperlipidemia   . Hypertension   . Malignant neoplasm of upper-outer quadrant of female breast (HCC) July 20, 2013   histologic grade 3 invasive mammary carcinoma, 13 mm,  2/16 nodes positive, ER-positive, PR positive, HER-2/neu not amplified.T1c,N1a  . Mammographic microcalcification   . Morbid obesity (HCC)   . Personal history of chemotherapy 2014   BREAST CA  . Personal history of radiation therapy 2014   BREAST CA  . Torn rotator cuff 12/2015   left     Past Surgical History: Past Surgical History:  Procedure Laterality Date  . BREAST EXCISIONAL BIOPSY Right 06/2013   [facility], clear margins, LN positive  . BREAST SURGERY Right 07-20-13   Wide excision, SLN biopsy, axillary dissection.  . COLONOSCOPY  2007,2015  . JOINT REPLACEMENT  07/30/2007   [facility] Dr Hines  . KNEE ARTHROSCOPY  03/04/2007   left Dr Hines  . PORTACATH PLACEMENT  2014  . s/p chemotherapy Right    right breast cancer   . s/p radiation  therapy Right    Whole breast radiation, right.  . TOTAL KNEE ARTHROPLASTY Right 01/19/2017   Procedure: TOTAL KNEE ARTHROPLASTY;  Surgeon: John J Poggi, MD;  Location: [facility] ORS;  Service: Orthopedics;  Laterality: Right;  . TOTAL SHOULDER ARTHROPLASTY Left 2019  . TUBAL LIGATION  ~1986   BTL    Home Medications: Prior to Admission medications   Medication Sig Start Date End Date Taking? Authorizing Provider  ACCU-CHEK FASTCLIX LANCETS MISC Use as instructed to check blood sugar 3 times per week. 10/12/18  Yes Duncan, Graham S, MD  acetaminophen (TYLENOL) 500 MG tablet Take 1,000 mg every 6 (six) hours as needed by mouth for moderate pain or headache.    Yes [provider]  Calcium Carb-Cholecalciferol (CALCIUM 600 + D PO) Take 1 tablet by mouth 2 (two) times daily.   Yes [provider]  docusate sodium (COLACE) 100 MG capsule Take 100 mg daily as needed by mouth for mild constipation.   Yes [provider]  fluticasone (FLONASE) 50 MCG/ACT nasal spray Place 1 spray into both nostrils daily as needed for allergies or rhinitis.  11/19/16  Yes [provider]  Loratadine (CLARITIN PO) Take 1 tablet by mouth daily as needed.   Yes [provider]  metoprolol tartrate (LOPRESSOR) 100 MG tablet Take 1 tablet (100 mg total) by mouth 2 (two) times daily. 08/22/18  Yes Duncan, Graham S, MD  omeprazole (  PRILOSEC) 20 MG capsule TAKE 1 CAPSULE BY MOUTH EVERY DAY 08/22/18  Yes Duncan, Graham S, MD  pravastatin (PRAVACHOL) 20 MG tablet TAKE 1 TABLET BY MOUTH EVERY DAY 08/10/18  Yes Duncan, Graham S, MD  amoxicillin (AMOXIL) 500 MG capsule Take 2,000 mg See admin instructions by mouth. Take 2000 mg by mouth 1 hour prior to dental procdures    [provider]    Allergies: Allergies  Allergen Reactions  . Simvastatin Other (See Comments)    Myalgias.    . Latex Rash and Other (See Comments)    More adhesive allergy than latex.  Has never been RAST  tested. Thinks paper tape is okay.  . Lisinopril Cough  . Sulfonamide Derivatives Hives and Other (See Comments)    REACTION: itching years ago    Review of Systems: Review of Systems  Constitutional: Negative for chills and fever.  Respiratory: Negative for shortness of breath.   Cardiovascular: Negative for chest pain.  Gastrointestinal: Negative for nausea and vomiting.  Skin: Negative for rash.    Physical Exam BP 130/74   Pulse 76   Temp 97.8 F (36.6 C) (Skin)   Ht 5' 8" (1.727 m)   Wt 294 lb (133.4 kg)   SpO2 98%   BMI 44.70 kg/m  CONSTITUTIONAL: No acute distress HEENT:  Normocephalic, atraumatic, extraocular motion intact. RESPIRATORY:  Lungs are clear, and breath sounds are equal bilaterally. Normal respiratory effort without pathologic use of accessory muscles. CARDIOVASCULAR: Heart is regular without murmurs, gallops, or rubs. BREAST:  Right breast s/p lumpectomy in lower inner quadrant.  Scar is well healed with expected scarring.  No palpable masses, skin changes, or nipple changes.  Right axillary incision well healed.  No right axillary or supraclavicular lymphadenopathy.  On left, breast has no palpable masses, skin changes, or nipple changes.  No left axillary or supraclavicular lymphadenopathy. NEUROLOGIC:  Motor and sensation is grossly normal.  Cranial nerves are grossly intact. PSYCH:  Alert and oriented to person, place and time. Affect is normal.  Labs/Imaging: Mammogram 07/14/19: FINDINGS: The probably benign calcifications in the right breast are stable since April 2020. These likely represent evolving fat necrosis. No other suspicious findings are identified in either breast.  Mammographic images were processed with CAD.  IMPRESSION: Probably benign right breast calcifications, likely evolving fat necrosis. No other suspicious findings in either breast.  RECOMMENDATION: Twelve month follow-up of the probably benign right breast  calcifications.  Assessment and Plan: This is a 63 y.o. female s/p right breast lumpectomy and axillary dissection in 2014.  Discussed with patient the results of the mammogram and that given the stability, it is more likely that these calcifications are benign.  At this point, her last two mammograms have shown stability.  However, I would like to follow up with her in one year with another mammogram to follow up on her calcifications as a precaution.  She will follow up in a year, and if things are stable again then, we can d/c from our care.  Patient understands this plan and is in agreement.  Face-to-face time spent with the patient and care providers was 15 minutes, with more than 50% of the time spent counseling, educating, and coordinating care of the patient.     Jose Luis Piscoya, MD [facility]    

## 2019-08-13 ENCOUNTER — Other Ambulatory Visit: Payer: Self-pay | Admitting: Family Medicine

## 2019-08-29 ENCOUNTER — Telehealth: Payer: Self-pay

## 2019-08-29 NOTE — Telephone Encounter (Signed)
LVM w COVID screen, back lab and front door info

## 2019-08-31 ENCOUNTER — Other Ambulatory Visit: Payer: Self-pay | Admitting: Family Medicine

## 2019-08-31 ENCOUNTER — Other Ambulatory Visit: Payer: Self-pay

## 2019-08-31 ENCOUNTER — Other Ambulatory Visit (INDEPENDENT_AMBULATORY_CARE_PROVIDER_SITE_OTHER): Payer: BC Managed Care – PPO

## 2019-08-31 DIAGNOSIS — R7989 Other specified abnormal findings of blood chemistry: Secondary | ICD-10-CM

## 2019-08-31 DIAGNOSIS — R739 Hyperglycemia, unspecified: Secondary | ICD-10-CM

## 2019-08-31 DIAGNOSIS — I10 Essential (primary) hypertension: Secondary | ICD-10-CM

## 2019-08-31 LAB — COMPREHENSIVE METABOLIC PANEL
ALT: 23 U/L (ref 0–35)
AST: 18 U/L (ref 0–37)
Albumin: 3.9 g/dL (ref 3.5–5.2)
Alkaline Phosphatase: 88 U/L (ref 39–117)
BUN: 17 mg/dL (ref 6–23)
CO2: 33 mEq/L — ABNORMAL HIGH (ref 19–32)
Calcium: 9.5 mg/dL (ref 8.4–10.5)
Chloride: 102 mEq/L (ref 96–112)
Creatinine, Ser: 0.93 mg/dL (ref 0.40–1.20)
GFR: 60.48 mL/min (ref >60.00)
Glucose, Bld: 131 mg/dL — ABNORMAL HIGH (ref 70–99)
Potassium: 4.7 mEq/L (ref 3.5–5.1)
Sodium: 141 mEq/L (ref 135–145)
Total Bilirubin: 0.4 mg/dL (ref 0.2–1.2)
Total Protein: 6.8 g/dL (ref 6.0–8.3)

## 2019-08-31 LAB — LIPID PANEL
Cholesterol: 168 mg/dL (ref 0–200)
HDL: 32.6 mg/dL — ABNORMAL LOW (ref >39.00)
LDL Cholesterol: 111 mg/dL — ABNORMAL HIGH (ref 0–99)
NonHDL: 135.32
Total CHOL/HDL Ratio: 5
Triglycerides: 123 mg/dL (ref 0.0–149.0)
VLDL: 24.6 mg/dL (ref 0.0–40.0)

## 2019-08-31 LAB — VITAMIN D 25 HYDROXY (VIT D DEFICIENCY, FRACTURES): VITD: 56.97 ng/mL (ref 30.00–100.00)

## 2019-08-31 LAB — HEMOGLOBIN A1C: Hgb A1c MFr Bld: 6.6 % — ABNORMAL HIGH (ref 4.6–6.5)

## 2019-09-04 ENCOUNTER — Ambulatory Visit (INDEPENDENT_AMBULATORY_CARE_PROVIDER_SITE_OTHER): Payer: BC Managed Care – PPO | Admitting: Family Medicine

## 2019-09-04 ENCOUNTER — Encounter: Payer: Self-pay | Admitting: Family Medicine

## 2019-09-04 ENCOUNTER — Other Ambulatory Visit: Payer: Self-pay

## 2019-09-04 VITALS — BP 136/86 | HR 84 | Temp 97.2°F | Ht 68.0 in | Wt 295.6 lb

## 2019-09-04 DIAGNOSIS — Z0000 Encounter for general adult medical examination without abnormal findings: Secondary | ICD-10-CM

## 2019-09-04 DIAGNOSIS — Z7189 Other specified counseling: Secondary | ICD-10-CM

## 2019-09-04 DIAGNOSIS — E119 Type 2 diabetes mellitus without complications: Secondary | ICD-10-CM

## 2019-09-04 DIAGNOSIS — Z23 Encounter for immunization: Secondary | ICD-10-CM | POA: Diagnosis not present

## 2019-09-04 DIAGNOSIS — I10 Essential (primary) hypertension: Secondary | ICD-10-CM

## 2019-09-04 DIAGNOSIS — E785 Hyperlipidemia, unspecified: Secondary | ICD-10-CM

## 2019-09-04 MED ORDER — METOPROLOL TARTRATE 100 MG PO TABS: 100.0000 mg | ORAL_TABLET | Freq: Two times a day (BID) | ORAL | 3 refills | Status: DC

## 2019-09-04 MED ORDER — PRAVASTATIN SODIUM 20 MG PO TABS: 20.0000 mg | ORAL_TABLET | Freq: Every day | ORAL | 3 refills | Status: DC

## 2019-09-04 MED ORDER — OMEPRAZOLE 20 MG PO CPDR: DELAYED_RELEASE_CAPSULE | ORAL | 3 refills | Status: DC

## 2019-09-04 NOTE — Progress Notes (Signed)
CPE- See plan.  Routine anticipatory guidance given to patient.  See health maintenance.  The possibility exists that previously documented standard health maintenance information may have been brought forward from a previous encounter into this note.  If needed, that same information has been updated to reflect the current situation based on today's encounter.    Tetanus 2013 PNA 2020 Shingles shot d/w pt.  Flu [id number] Colonoscopy 2020 Mammogram comes through [facility] surgery clinic.  Pap not due at 78 DXA prev done 2018.  Declined prolia after talking to her dentist.   Diet and exercise d/w pt.   Living will d/w pt. Would have daughter Elsworth Soho if patient were incapacitated. HCV and HIV screening d/w pt. [place] [facility], would have screening done then, ~1990s.   She is caring for her father.  He has memory loss and he confuses the patient with the patient's aunt.  Pt is trying to work through the situation.  Discussed.  Support offered.  Elevated Cholesterol: Using medications without problems: yes Muscle aches: no Diet compliance: encouraged.   Exercise: encouraged.   Hypertension:    Using medication without problems or lightheadedness: yes Chest pain with exertion:no Edema:no Short of breath: some with walking (intermittently but not always consistently noted), but no CP with walking.  No wheeze.  Not SOB supine.    DM2.  A1c 6.6.  No meds.  Labs d/w pt.  Discussed diet and exercise.    PMH and SH reviewed Meds, vitals, and allergies reviewed.   ROS: Per HPI.  Unless specifically indicated otherwise in HPI, the patient denies:  General: fever. Eyes: acute vision changes ENT: sore throat Cardiovascular: chest pain Respiratory: SOB GI: vomiting GU: dysuria Musculoskeletal: acute back pain Derm: acute rash Neuro: acute motor dysfunction Psych: worsening mood Endocrine: polydipsia Heme: bleeding Allergy: hayfever  GEN: nad, alert  and oriented HEENT: ncat NECK: supple w/o LA CV: rrr. PULM: ctab, no inc wob ABD: soft, +bs EXT: no edema SKIN: no acute rash  Diabetic foot exam: Normal inspection No skin breakdown No calluses  Normal DP pulses Normal sensation to light touch and monofilament Nails normal

## 2019-09-04 NOTE — Patient Instructions (Addendum)
Check with your insurance to see if they will cover the shingrix shot. Try to gradually add a little time/distance to walking.  Let me know if that doesn't help.  Recheck A1c in about 6 months at a visit.  Don't change your meds for now.

## 2019-09-06 NOTE — Assessment & Plan Note (Signed)
Tetanus 2013 PNA 2020 Shingles shot d/w pt.  Flu [id number] Colonoscopy 2020 Mammogram comes through [facility] surgery clinic.  Pap not due at 29 DXA prev done 2018.  Declined prolia after talking to her dentist.   Diet and exercise d/w pt.   Living will d/w pt. Would have daughter Elsworth Soho if patient were incapacitated. HCV and HIV screening d/w pt. [facility] blood [facility], would have screening done then, ~1990s.

## 2019-09-06 NOTE — Assessment & Plan Note (Signed)
No change in meds at this point.  Discussed diet and exercise.  She will gradually increase her walking and update me if not tolerated.  Recheck periodically.

## 2019-09-06 NOTE — Assessment & Plan Note (Signed)
Living will d/w pt. Would have daughter Amanda designated if patient were incapacitated. 

## 2019-09-06 NOTE — Assessment & Plan Note (Signed)
A1c 6.6.  No meds.  Labs d/w pt.  Discussed diet and exercise.   No new meds at this point.  Recheck in 6 months.  She will update me as needed.

## 2019-09-26 ENCOUNTER — Inpatient Hospital Stay: Payer: BC Managed Care – PPO | Attending: Hematology and Oncology

## 2019-09-26 ENCOUNTER — Other Ambulatory Visit: Payer: Self-pay

## 2019-09-26 DIAGNOSIS — C50911 Malignant neoplasm of unspecified site of right female breast: Secondary | ICD-10-CM | POA: Insufficient documentation

## 2019-09-26 DIAGNOSIS — M8580 Other specified disorders of bone density and structure, unspecified site: Secondary | ICD-10-CM | POA: Diagnosis not present

## 2019-09-26 DIAGNOSIS — I10 Essential (primary) hypertension: Secondary | ICD-10-CM | POA: Diagnosis not present

## 2019-09-26 DIAGNOSIS — Z170 Estrogen receptor positive status [ER+]: Secondary | ICD-10-CM | POA: Diagnosis not present

## 2019-09-26 DIAGNOSIS — C50311 Malignant neoplasm of lower-inner quadrant of right female breast: Secondary | ICD-10-CM

## 2019-09-26 LAB — CBC WITH DIFFERENTIAL/PLATELET
Abs Immature Granulocytes: 0.09 10*3/uL — ABNORMAL HIGH (ref 0.00–0.07)
Basophils Absolute: 0.1 10*3/uL (ref 0.0–0.1)
Basophils Relative: 1 %
Eosinophils Absolute: 0.3 10*3/uL (ref 0.0–0.5)
Eosinophils Relative: 2 %
HCT: 44.3 % (ref 36.0–46.0)
Hemoglobin: 14.7 g/dL (ref 12.0–15.0)
Immature Granulocytes: 1 %
Lymphocytes Relative: 26 %
Lymphs Abs: 3.3 10*3/uL (ref 0.7–4.0)
MCH: 28.8 pg (ref 26.0–34.0)
MCHC: 33.2 g/dL (ref 30.0–36.0)
MCV: 86.9 fL (ref 80.0–100.0)
Monocytes Absolute: 0.9 10*3/uL (ref 0.1–1.0)
Monocytes Relative: 7 %
Neutro Abs: 8.2 10*3/uL — ABNORMAL HIGH (ref 1.7–7.7)
Neutrophils Relative %: 63 %
Platelets: 247 10*3/uL (ref 150–400)
RBC: 5.1 MIL/uL (ref 3.87–5.11)
RDW: 13.3 % (ref 11.5–15.5)
WBC: 12.7 10*3/uL — ABNORMAL HIGH (ref 4.0–10.5)
nRBC: 0 % (ref 0.0–0.2)

## 2019-09-26 LAB — COMPREHENSIVE METABOLIC PANEL
ALT: 25 U/L (ref 0–44)
AST: 21 U/L (ref 15–41)
Albumin: 4.3 g/dL (ref 3.5–5.0)
Alkaline Phosphatase: 86 U/L (ref 38–126)
Anion gap: 7 (ref 5–15)
BUN: 12 mg/dL (ref 8–23)
CO2: 31 mmol/L (ref 22–32)
Calcium: 9.3 mg/dL (ref 8.9–10.3)
Chloride: 98 mmol/L (ref 98–111)
Creatinine, Ser: 0.94 mg/dL (ref 0.44–1.00)
GFR calc Af Amer: >60 mL/min (ref >60)
GFR calc non Af Amer: >60 mL/min (ref >60)
Glucose, Bld: 100 mg/dL — ABNORMAL HIGH (ref 70–99)
Potassium: 4.5 mmol/L (ref 3.5–5.1)
Sodium: 136 mmol/L (ref 135–145)
Total Bilirubin: 0.7 mg/dL (ref 0.3–1.2)
Total Protein: 7.7 g/dL (ref 6.5–8.1)

## 2019-09-26 NOTE — Progress Notes (Signed)
[facility]  [address], [address] [place], [place] [address] Phone: [phone]  Fax: [phone]   Telemedicine Office Visit:  09/27/2019  Referring physician: Tonia Ghent, MD  I connected with Tina Delgado on 09/27/2019 at 4:00 PM by videoconferencing and verified that I was speaking with the correct person using 2 identifiers.  The patient was at home.  I discussed the limitations, risk, security and privacy concerns of performing an evaluation and management service by videoconferencing and the availability of in person appointments.  I also discussed with the patient that there may be a patient responsible charge related to this service.  The patient expressed understanding and agreed to proceed.   Chief Complaint: Tina Delgado is a 63 y.o. female with a history of stage IIB left breast cancer and osteopeniawho is seen for 6 month assessment.  HPI: The patient was last seen in the medical oncology clinic on 03/27/2019 via telemedicine. At that time, she was doing well.  She denied any breast concerns.  CA27.29 was 9.8 (normal). We discussed a right diagnostic mammogram and bone density. I discussed plan for 5 years of adjuvant endocrine therapy. Patient continued calcium and vitamin D.   Bone density on 04/03/2019 revealed osteopenia with a T-score of -1.4 at the right neck femur.    Colonoscopy at [facility] on 05/26/2019 revealed five 8 to 12 mm polyps in the transverse colon. There were three 6 to 7 mm polyps in the transverse colon, in the ascending colon and in the cecum. There was one 4 mm polyp in the ascending colon. The examination was otherwise normal on direct and retroflexion views.   Pathology revealed tubular adenoma(s) without high-grade dysplasia or malignancy.   Bilateral mammogram on 07/14/2019 revealed probably benign right breast calcifications, likely evolving fat necrosis. There were no other suspicious findings  in either breast. One year follow up was recommended.   She was seen by Dr. Hampton Abbot on 07/18/2019. Calcifications were felt benign.  Last two mammograms had shown stability. Dr. Hampton Abbot wanted to to a repeat mammogram in 1 year and follow up.   Labs on 09/26/2019: Hematocrit 44.3, hemoglobin 14.7, platelets 247,000, WBC 12,700 (ANC 8,200). CA 27.29 was 12.8.   During the interim, she has felt good.  She notes no problems.  She is taking her calcium and vitamin D.  She has ongoing left shoulder pain for 4 months secondary to left shoulder surgery back in 2019. Plan is for follow-up colonoscopy in 2022. Patient would like to continue care with Dr. Bary Castilla.    Past Medical History:  Diagnosis Date  . Allergy   . Arthritis    s/p knee injection per ortho  . Breast cancer ([place]) 2014   RT LUMPECTOMY  . Depression   . Diabetes mellitus without complication ([place])   . Dyspnea   . Elevated glucose 05/2003   106  . GERD (gastroesophageal reflux disease)   . Hyperlipidemia   . Hypertension   . Malignant neoplasm of upper-outer quadrant of female breast [facility]) July 20, 2013   histologic grade 3 invasive mammary carcinoma, 13 mm,  2/16 nodes positive, ER-positive, PR positive, HER-2/neu not amplified.T1c,N1a  . Mammographic microcalcification   . Morbid obesity ([place])   . Personal history of chemotherapy 2014   BREAST CA  . Personal history of radiation therapy 2014   BREAST CA  . Torn rotator cuff 12/2015   left    Past Surgical History:  Procedure Laterality  Date  . BREAST EXCISIONAL BIOPSY Right 06/2013   IMC, clear margins, LN positive  . BREAST SURGERY Right 07-20-13   Wide excision, SLN biopsy, axillary dissection.  . COLONOSCOPY  [id number]  . JOINT REPLACEMENT  07/30/2007   LTKR Dr Derrel Nip  . KNEE ARTHROSCOPY  03/04/2007   left Dr Derrel Nip  . PORTACATH PLACEMENT  2014  . s/p chemotherapy Right    right breast cancer   . s/p radiation therapy Right    Whole breast radiation,  right.  [name] TOTAL KNEE ARTHROPLASTY Right 01/19/2017   Procedure: TOTAL KNEE ARTHROPLASTY;  Surgeon: Corky Mull, MD;  Location: [facility] ORS;  Service: Orthopedics;  Laterality: Right;  . TOTAL SHOULDER ARTHROPLASTY Left 2019  . TUBAL LIGATION  ~1986   BTL    Family History  Problem Relation Age of Onset  . Heart disease Mother        MI  . Depression Mother        paranoia  . Hypertension Mother   . Stroke Mother   . Parkinsonism Mother   . Heart disease Brother        MI PTCA at 22  . Heart disease Father   . Stomach cancer Maternal Grandfather 62  . Diabetes Maternal Grandfather   . Breast cancer Cousin   . Colon cancer Paternal Uncle   . Diabetes Maternal Aunt   . Diabetes Maternal Uncle   . Diabetes Maternal Uncle   . Esophageal cancer Neg Hx   . Rectal cancer Neg Hx     Social History:  reports that she has never smoked. She has never used smokeless tobacco. She reports current alcohol use. She reports that she does not use drugs. She lives in [place]. She is still working. The patient is alone today.  Participants in the patient's visit and their role in the encounter included the patient and Vito Berger, CMA, today.  The intake visit was provided by Vito Berger, CMA.   Allergies:  Allergies  Allergen Reactions  . Simvastatin Other (See Comments)    Myalgias.    . Latex Rash and Other (See Comments)    More adhesive allergy than latex.  Has never been RAST tested. Thinks paper tape is okay.  . Lisinopril Cough  . Sulfonamide Derivatives Hives and Other (See Comments)    REACTION: itching years ago    Current Medications: Current Outpatient Medications  Medication Sig Dispense Refill  . Calcium Carb-Cholecalciferol (CALCIUM 600 + D PO) Take 1 tablet by mouth 2 (two) times daily.    [name] docusate sodium (COLACE) 100 MG capsule Take 100 mg daily as needed by mouth for mild constipation.    . fluticasone (FLONASE) 50 MCG/ACT nasal spray Place 1 spray  into both nostrils daily as needed for allergies or rhinitis.     . Loratadine (CLARITIN PO) Take 1 tablet by mouth daily as needed.    . metoprolol tartrate (LOPRESSOR) 100 MG tablet Take 1 tablet (100 mg total) by mouth 2 (two) times daily. 180 tablet 3  . omeprazole (PRILOSEC) 20 MG capsule TAKE 1 CAPSULE BY MOUTH EVERY DAY 90 capsule 3  . pravastatin (PRAVACHOL) 20 MG tablet Take 1 tablet (20 mg total) by mouth daily. 90 tablet 3  . acetaminophen (TYLENOL) 500 MG tablet Take 1,000 mg every 6 (six) hours as needed by mouth for moderate pain or headache.     [name] amoxicillin (AMOXIL) 500 MG capsule Take 2,000 mg See admin instructions by mouth. Take  2000 mg by mouth 1 hour prior to dental procdures     No current facility-administered medications for this visit.      Review of Systems  Constitutional: Negative.  Negative for chills, diaphoresis, fever, malaise/fatigue and weight loss.       Feels "good".  HENT: Negative.  Negative for congestion, hearing loss, nosebleeds, sinus pain and sore throat.   Eyes: Negative.  Negative for blurred vision.  Respiratory: Negative.  Negative for cough, hemoptysis, sputum production and shortness of breath.   Cardiovascular: Negative.  Negative for chest pain, palpitations, orthopnea, leg swelling and PND.  Gastrointestinal: Negative.  Negative for abdominal pain, blood in stool, constipation, diarrhea, melena, nausea and vomiting.  Genitourinary: Negative.  Negative for dysuria, frequency, hematuria and urgency.  Musculoskeletal: Positive for joint pain (left shoulder; ongoing). Negative for back pain, falls and myalgias.       S/p LEFT shoulder replacement (10/2017) and RIGHT knee arthoplasty (2018).  Skin: Negative.  Negative for itching and rash.  Neurological: Negative.  Negative for dizziness, sensory change, weakness and headaches.  Endo/Heme/Allergies: Negative.  Does not bruise/bleed easily.  Psychiatric/Behavioral: Negative.  Negative for  depression and memory loss. The patient is not nervous/anxious and does not have insomnia.   All other systems reviewed and are negative.   Performance status (ECOG): 1  Physical Exam  Constitutional: She is oriented to person, place, and time. She appears well-developed and well-nourished. No distress.  HENT:  Head: Normocephalic and atraumatic.  Short gray hair.  Eyes: Conjunctivae and EOM are normal. No scleral icterus.  Glasses.  Neurological: She is alert and oriented to person, place, and time.  Psychiatric: She has a normal mood and affect. Her behavior is normal. Judgment and thought content normal.  Nursing note reviewed.   Appointment on 09/26/2019  Component Date Value Ref Range Status  . CA 27.29 09/26/2019 12.8  0.0 - 38.6 U/mL Final   Comment: (NOTE) Siemens Centaur Immunochemiluminometric Methodology [facility]) Values obtained with different assay methods or kits cannot be used interchangeably. Results cannot be interpreted as absolute evidence of the presence or absence of malignant disease. Performed At: [facility] [place], [place] [id number] Rush Farmer MD [id number]   . Sodium 09/26/2019 136  135 - 145 mmol/L Final  . Potassium 09/26/2019 4.5  3.5 - 5.1 mmol/L Final  . Chloride 09/26/2019 98  98 - 111 mmol/L Final  . CO2 09/26/2019 31  22 - 32 mmol/L Final  . Glucose, Bld 09/26/2019 100* 70 - 99 mg/dL Final  . BUN 09/26/2019 12  8 - 23 mg/dL Final  . Creatinine, Ser 09/26/2019 0.94  0.44 - 1.00 mg/dL Final  . Calcium 09/26/2019 9.3  8.9 - 10.3 mg/dL Final  . Total Protein 09/26/2019 7.7  6.5 - 8.1 g/dL Final  . Albumin 09/26/2019 4.3  3.5 - 5.0 g/dL Final  . AST 09/26/2019 21  15 - 41 U/L Final  . ALT 09/26/2019 25  0 - 44 U/L Final  . Alkaline Phosphatase 09/26/2019 86  38 - 126 U/L Final  . Total Bilirubin 09/26/2019 0.7  0.3 - 1.2 mg/dL Final  . GFR calc non Af Amer 09/26/2019 >60  >60 mL/min Final  . GFR calc Af Amer  09/26/2019 >60  >60 mL/min Final  . Anion gap 09/26/2019 7  5 - 15 Final   Performed at [facility], [address] [place], [place] [address]  . WBC 09/26/2019 12.7* 4.0 - 10.5 K/uL  Final  . RBC 09/26/2019 5.10  3.87 - 5.11 MIL/uL Final  . Hemoglobin 09/26/2019 14.7  12.0 - 15.0 g/dL Final  . HCT 09/26/2019 44.3  36.0 - 46.0 % Final  . MCV 09/26/2019 86.9  80.0 - 100.0 fL Final  . MCH 09/26/2019 28.8  26.0 - 34.0 pg Final  . MCHC 09/26/2019 33.2  30.0 - 36.0 g/dL Final  . RDW 09/26/2019 13.3  11.5 - 15.5 % Final  . Platelets 09/26/2019 247  150 - 400 K/uL Final  . nRBC 09/26/2019 0.0  0.0 - 0.2 % Final  . Neutrophils Relative % 09/26/2019 63  % Final  . Neutro Abs 09/26/2019 8.2* 1.7 - 7.7 K/uL Final  . Lymphocytes Relative 09/26/2019 26  % Final  . Lymphs Abs 09/26/2019 3.3  0.7 - 4.0 K/uL Final  . Monocytes Relative 09/26/2019 7  % Final  . Monocytes Absolute 09/26/2019 0.9  0.1 - 1.0 K/uL Final  . Eosinophils Relative 09/26/2019 2  % Final  . Eosinophils Absolute 09/26/2019 0.3  0.0 - 0.5 K/uL Final  . Basophils Relative 09/26/2019 1  % Final  . Basophils Absolute 09/26/2019 0.1  0.0 - 0.1 K/uL Final  . Immature Granulocytes 09/26/2019 1  % Final  . Abs Immature Granulocytes 09/26/2019 0.09* 0.00 - 0.07 K/uL Final   Performed at [facility], [address] [place], [place] [address]    Assessment:  Tina Delgado is a 63 y.o. female with a history of stage IIB right breast cancers/p lumpectomy on 05/2013. Pathology revealed a grade III 1.3 cm lesion with 2 of 16 lymph nodes positive. Pathologic stage was T1cN1M0lesion. Tumor was ER+/PR+and Her2/neu- .  She enrolled on NSABP B47clinical trial. She received 4 cycles of TC(shortened course due to adverse events) from 07/28/2013 to 10/27/2013. She received breast radiationfrom 12/18/2013 to 02/06/2014. In 2015, she took Femara(letrozole) x 1 month. She was switched to Arimidexsecondary  to dysgeusia (metallic taste). She completed 5 years of Arimidex in 08/2019.  BCI testing on 10/07/2018 revealed a 13.1% (95% CI: 7.6%-18.3%) risk of late recurrence (years 5-10) and a low likelihood of benefit.  Diagnostic right mammogramon 07/27/2018 revealed the development of probably benign calcifications along the surgical scar anterior to the lumpectomy site.  Diagnostic right mammogram on 01/26/2019 revealed probably benign calcifications in the anterior third of the medial right breast.  Bilateral mammogram on 07/14/2019 revealed probably benign right breast calcifications, likely evolving fat necrosis. There were no other suspicious findings in either breast. Follow-up mammogram in 1 year was recommended  CA27.29has been followed: 14.7 on 02/21/2014, 12.4 on 05/09/2015, 10.6 on 10/07/2015, 18.1 on 02/10/2016, 15.0 on 08/12/2016, 18.5 on 02/08/2017, 17.4 on 08/12/2017, 11.2 on 02/24/2018, 9.4 on 09/01/2018, 9.8 on 03/24/2019, and 12.8 on 09/26/2019.  Bone density study on 11/15/2014 revealed osteopeniawith a T-score of -1.2 in right femur. Bone density on 03/16/2017 revealed osteopenia with a T-score of -1.4 in the right femur. Bone density on 04/03/2019 revealed osteopenia with a T-score of -1.4 at the right neck femur.  She is on calcium and vitamin D. She underwent right knee arthroplastyfor osteoarthritis on 01/19/2017.  Colonoscopy on 05/26/2019 revealed five 8 to 12 mm polyps in the transverse colon. There were three 6 to 7 mm polyps in the transverse colon, in the ascending colon and in the cecum. There was one 4 mm polyp in the ascending colon.  Pathology revealed tubular adenoma(s) without high-grade dysplasia or malignancy.  Repeat colonoscopy is planned in  2022.  Symptomatically, she is doing well.  She denies any breast concerns.  Plan:  1.   Review labs from 09/26/2019.  2.   Stage IIB right breast cancer Clinically, she continues to do well. She denies any breast  concerns. Breast exam by Dr Hampton Abbot on 07/18/2019 revealed no concerns. CA27.29 is normal.  Bilateral mammogram on 07/14/2019 revealed likely benign right breast calcifications, likely evolving fat necrosis.    Discuss plan for follow-up imaging. Schedule mammogram on 07/15/2020. BCI testing revealed low likelihood of benefit of extended adjuvant endocrine therapy.             She completed 5 years of Arimidex < 1 month ago. Discuss annual surveillance.      3.   Osteopenia   Bone density on 04/03/2019 revealed osteopenia with a T-score of -1.4 at the right neck femur.    Continue calcium and vitamin D.  4.   RTC in 1 year for MD assessment and labs (CBC with diff, CMP, CA27.29), and review of mammogram.  I discussed the assessment and treatment plan with the patient.  The patient was provided an opportunity to ask questions and all were answered.  The patient agreed with the plan and demonstrated an understanding of the instructions.  The patient was advised to call back or seek an in person evaluation if the symptoms worsen or if the condition fails to improve as anticipated.  I provided 12 minutes (4:00 PM - 4:11 PM) of face-to-face video visit time during this this encounter and > 50% was spent counseling as documented under my assessment and plan.  I provided these services from the [facility] office.   Nolon Stalls, MD, PhD  09/27/2019, 4:00 PM  I, Selena Batten, am acting as scribe for [employer]. Mike Gip, MD, PhD.  I, Melissa C. Mike Gip, MD, have reviewed the above documentation for accuracy and completeness, and I agree with the above.

## 2019-09-27 ENCOUNTER — Other Ambulatory Visit: Payer: BLUE CROSS/BLUE SHIELD

## 2019-09-27 ENCOUNTER — Inpatient Hospital Stay (HOSPITAL_BASED_OUTPATIENT_CLINIC_OR_DEPARTMENT_OTHER): Payer: BC Managed Care – PPO | Admitting: Hematology and Oncology

## 2019-09-27 ENCOUNTER — Encounter: Payer: Self-pay | Admitting: Hematology and Oncology

## 2019-09-27 DIAGNOSIS — M85851 Other specified disorders of bone density and structure, right thigh: Secondary | ICD-10-CM

## 2019-09-27 DIAGNOSIS — Z170 Estrogen receptor positive status [ER+]: Secondary | ICD-10-CM

## 2019-09-27 DIAGNOSIS — C50311 Malignant neoplasm of lower-inner quadrant of right female breast: Secondary | ICD-10-CM

## 2019-09-27 LAB — CANCER ANTIGEN 27.29: CA 27.29: 12.8 U/mL (ref 0.0–38.6)

## 2019-09-27 NOTE — Progress Notes (Signed)
No new changes noted today. The patient name and DOB has been verified by phone today. 

## 2019-12-31 ENCOUNTER — Ambulatory Visit: Payer: Medicare Other | Attending: Internal Medicine

## 2019-12-31 DIAGNOSIS — Z23 Encounter for immunization: Secondary | ICD-10-CM | POA: Insufficient documentation

## 2019-12-31 NOTE — Progress Notes (Signed)
   Covid-19 Vaccination Clinic  Name:  Tina Delgado    MRN: [id number] DOB: [birth year]  12/31/2019  [name] was observed post Covid-19 immunization for 15 minutes without incident. She was provided with Vaccine Information Sheet and instruction to access the V-Safe system.   [name] was instructed to call 911 with any severe reactions post vaccine: [name] Difficulty breathing  . Swelling of face and throat  . A fast heartbeat  . A bad rash all over body  . Dizziness and weakness   Immunizations Administered    Name Date Dose VIS Date Route   Pfizer COVID-19 Vaccine 12/31/2019 10:51 AM 0.3 mL 10/06/2019 Intramuscular   Manufacturer: [place]   Lot: [id number]   [place]: [id number]

## 2020-01-24 ENCOUNTER — Ambulatory Visit: Payer: Medicare Other | Attending: Internal Medicine

## 2020-01-24 DIAGNOSIS — Z23 Encounter for immunization: Secondary | ICD-10-CM

## 2020-01-24 NOTE — Progress Notes (Signed)
   Covid-19 Vaccination Clinic  Name:  Tina Delgado    MRN: [id number] DOB: 07/12/1956  01/24/2020  [name] was observed post Covid-19 immunization for 15 minutes without incident. She was provided with Vaccine Information Sheet and instruction to access the V-Safe system.   [name] was instructed to call 911 with any severe reactions post vaccine: [name] Difficulty breathing  . Swelling of face and throat  . A fast heartbeat  . A bad rash all over body  . Dizziness and weakness   Immunizations Administered    Name Date Dose VIS Date Route   Pfizer COVID-19 Vaccine 01/24/2020  4:00 PM 0.3 mL 10/06/2019 Intramuscular   Manufacturer: [place]   Lot: [id number]   [place]: [id number]

## 2020-02-20 ENCOUNTER — Other Ambulatory Visit: Payer: Self-pay | Admitting: Hematology and Oncology

## 2020-02-20 DIAGNOSIS — R921 Mammographic calcification found on diagnostic imaging of breast: Secondary | ICD-10-CM

## 2020-02-23 ENCOUNTER — Encounter: Payer: Self-pay | Admitting: Family Medicine

## 2020-03-04 ENCOUNTER — Ambulatory Visit (INDEPENDENT_AMBULATORY_CARE_PROVIDER_SITE_OTHER): Payer: Medicare Other | Admitting: Family Medicine

## 2020-03-04 ENCOUNTER — Other Ambulatory Visit: Payer: Self-pay

## 2020-03-04 ENCOUNTER — Encounter: Payer: Self-pay | Admitting: Family Medicine

## 2020-03-04 VITALS — BP 122/80 | HR 71 | Temp 96.6°F | Ht 68.0 in | Wt 291.6 lb

## 2020-03-04 DIAGNOSIS — E119 Type 2 diabetes mellitus without complications: Secondary | ICD-10-CM | POA: Diagnosis not present

## 2020-03-04 LAB — POCT GLYCOSYLATED HEMOGLOBIN (HGB A1C): Hemoglobin A1C: 6.4 % — AB (ref 4.0–5.6)

## 2020-03-04 NOTE — Progress Notes (Signed)
This visit occurred during the SARS-CoV-2 public health emergency.  Safety protocols were in place, including screening questions prior to the visit, additional usage of staff PPE, and extensive cleaning of exam room while observing appropriate contact time as indicated for disinfecting solutions.  Diabetes:  No meds.   Hypoglycemic episodes: no sx  Hyperglycemic episodes: no sx Feet problems: no Blood Sugars averaging: not checked often eye exam within last year: done fall 2021.  She'll go back this fall.   A1c d/w pt at OV.  Slightly better.    She is still caring for her father who has memory changes.    Meds, vitals, and allergies reviewed.  ROS: Per HPI unless specifically indicated in ROS section   GEN: nad, alert and oriented HEENT: ncat NECK: supple w/o LA CV: rrr. PULM: ctab, no inc wob ABD: soft, +bs EXT: no edema SKIN: no acute rash

## 2020-03-04 NOTE — Patient Instructions (Addendum)
Check with Rosaria Ferries on the way out today about your dad's referral.   Take care.  Glad to see you. Don't change your meds for now.  If you have significant weight change in the meantime the let me know.

## 2020-03-07 NOTE — Assessment & Plan Note (Signed)
A1c d/w pt at OV.  Slightly better.  No change in meds.  Continue work on diet and exercise and recheck periodically.  She agrees.  See after visit summary.

## 2020-07-15 ENCOUNTER — Ambulatory Visit
Admission: RE | Admit: 2020-07-15 | Discharge: 2020-07-15 | Disposition: A | Discharge status: Home / Self Care | Payer: Medicare Other | Source: Ambulatory Visit | Attending: Hematology and Oncology | Admitting: Hematology and Oncology

## 2020-07-15 ENCOUNTER — Other Ambulatory Visit: Payer: Self-pay

## 2020-07-15 DIAGNOSIS — R921 Mammographic calcification found on diagnostic imaging of breast: Secondary | ICD-10-CM

## 2020-07-17 ENCOUNTER — Ambulatory Visit: Payer: Self-pay | Admitting: Surgery

## 2020-08-21 ENCOUNTER — Encounter: Payer: Self-pay | Admitting: Family Medicine

## 2020-08-21 ENCOUNTER — Other Ambulatory Visit: Payer: Self-pay | Admitting: Family Medicine

## 2020-08-21 DIAGNOSIS — E119 Type 2 diabetes mellitus without complications: Secondary | ICD-10-CM

## 2020-08-21 DIAGNOSIS — R739 Hyperglycemia, unspecified: Secondary | ICD-10-CM

## 2020-08-21 DIAGNOSIS — R7989 Other specified abnormal findings of blood chemistry: Secondary | ICD-10-CM

## 2020-08-27 ENCOUNTER — Other Ambulatory Visit: Payer: Self-pay | Admitting: Family Medicine

## 2020-09-02 ENCOUNTER — Other Ambulatory Visit (INDEPENDENT_AMBULATORY_CARE_PROVIDER_SITE_OTHER): Payer: Medicare Other

## 2020-09-02 ENCOUNTER — Other Ambulatory Visit: Payer: Self-pay

## 2020-09-02 DIAGNOSIS — R7989 Other specified abnormal findings of blood chemistry: Secondary | ICD-10-CM | POA: Diagnosis not present

## 2020-09-02 DIAGNOSIS — R739 Hyperglycemia, unspecified: Secondary | ICD-10-CM | POA: Diagnosis not present

## 2020-09-02 DIAGNOSIS — E119 Type 2 diabetes mellitus without complications: Secondary | ICD-10-CM | POA: Diagnosis not present

## 2020-09-02 LAB — COMPREHENSIVE METABOLIC PANEL
ALT: 22 U/L (ref 0–35)
AST: 22 U/L (ref 0–37)
Albumin: 3.7 g/dL (ref 3.5–5.2)
Alkaline Phosphatase: 72 U/L (ref 39–117)
BUN: 10 mg/dL (ref 6–23)
CO2: 35 mEq/L — ABNORMAL HIGH (ref 19–32)
Calcium: 9.2 mg/dL (ref 8.4–10.5)
Chloride: 102 mEq/L (ref 96–112)
Creatinine, Ser: 0.93 mg/dL (ref 0.40–1.20)
GFR: 64.21 mL/min (ref >60.00)
Glucose, Bld: 121 mg/dL — ABNORMAL HIGH (ref 70–99)
Potassium: 5 mEq/L (ref 3.5–5.1)
Sodium: 140 mEq/L (ref 135–145)
Total Bilirubin: 0.5 mg/dL (ref 0.2–1.2)
Total Protein: 6.5 g/dL (ref 6.0–8.3)

## 2020-09-02 LAB — LIPID PANEL
Cholesterol: 185 mg/dL (ref 0–200)
HDL: 33.4 mg/dL — ABNORMAL LOW (ref >39.00)
LDL Cholesterol: 126 mg/dL — ABNORMAL HIGH (ref 0–99)
NonHDL: 151.55
Total CHOL/HDL Ratio: 6
Triglycerides: 129 mg/dL (ref 0.0–149.0)
VLDL: 25.8 mg/dL (ref 0.0–40.0)

## 2020-09-02 LAB — MICROALBUMIN / CREATININE URINE RATIO
Creatinine,U: 171.4 mg/dL
Microalb Creat Ratio: 0.5 mg/g (ref 0.0–30.0)
Microalb, Ur: 0.9 mg/dL (ref 0.0–1.9)

## 2020-09-02 LAB — HEMOGLOBIN A1C: Hgb A1c MFr Bld: 6.7 % — ABNORMAL HIGH (ref 4.6–6.5)

## 2020-09-02 LAB — VITAMIN D 25 HYDROXY (VIT D DEFICIENCY, FRACTURES): VITD: 42.43 ng/mL (ref 30.00–100.00)

## 2020-09-03 ENCOUNTER — Other Ambulatory Visit: Payer: PRIVATE HEALTH INSURANCE

## 2020-09-10 ENCOUNTER — Encounter: Payer: Self-pay | Admitting: Family Medicine

## 2020-09-10 ENCOUNTER — Other Ambulatory Visit: Payer: Self-pay

## 2020-09-10 ENCOUNTER — Ambulatory Visit (INDEPENDENT_AMBULATORY_CARE_PROVIDER_SITE_OTHER): Payer: Medicare Other | Admitting: Family Medicine

## 2020-09-10 ENCOUNTER — Other Ambulatory Visit: Payer: Self-pay | Admitting: Family Medicine

## 2020-09-10 VITALS — BP 128/80 | HR 80 | Temp 98.4°F | Ht 65.0 in | Wt 288.4 lb

## 2020-09-10 DIAGNOSIS — K219 Gastro-esophageal reflux disease without esophagitis: Secondary | ICD-10-CM

## 2020-09-10 DIAGNOSIS — Z23 Encounter for immunization: Secondary | ICD-10-CM

## 2020-09-10 DIAGNOSIS — Z0000 Encounter for general adult medical examination without abnormal findings: Secondary | ICD-10-CM

## 2020-09-10 DIAGNOSIS — E119 Type 2 diabetes mellitus without complications: Secondary | ICD-10-CM

## 2020-09-10 DIAGNOSIS — M6740 Ganglion, unspecified site: Secondary | ICD-10-CM | POA: Diagnosis not present

## 2020-09-10 DIAGNOSIS — E785 Hyperlipidemia, unspecified: Secondary | ICD-10-CM

## 2020-09-10 DIAGNOSIS — Z7189 Other specified counseling: Secondary | ICD-10-CM

## 2020-09-10 DIAGNOSIS — I10 Essential (primary) hypertension: Secondary | ICD-10-CM

## 2020-09-10 MED ORDER — METOPROLOL TARTRATE 100 MG PO TABS
100.0000 mg | ORAL_TABLET | Freq: Two times a day (BID) | ORAL | 3 refills | Status: DC
Start: 2020-09-10 — End: 2021-09-11

## 2020-09-10 MED ORDER — OMEPRAZOLE 20 MG PO CPDR
DELAYED_RELEASE_CAPSULE | ORAL | 3 refills | Status: DC
Start: 2020-09-10 — End: 2021-03-10

## 2020-09-10 MED ORDER — OMEPRAZOLE 20 MG PO CPDR
DELAYED_RELEASE_CAPSULE | ORAL | 3 refills | Status: DC
Start: 2020-09-10 — End: 2020-09-10

## 2020-09-10 MED ORDER — PRAVASTATIN SODIUM 20 MG PO TABS
20.0000 mg | ORAL_TABLET | Freq: Every day | ORAL | 3 refills | Status: DC
Start: 2020-09-10 — End: 2021-10-07

## 2020-09-10 NOTE — Patient Instructions (Addendum)
You can try tapering prilosec by 1 pill per week.   Take care.  Glad to see you. Ask to sign a release for the eye report from [place].   Plan on recheck in about 6 months.  A1c at the visit. You don't have to fast.

## 2020-09-10 NOTE — Telephone Encounter (Signed)
Pharmacy requests refill on: Omeprazole DR 20 mg   LAST REFILL: 06/08/2020 LAST OV: 09/10/2020 NEXT OV: Not Scheduled PHARMACY: [id number] [place], [place]

## 2020-09-10 NOTE — Progress Notes (Signed)
This visit occurred during the SARS-CoV-2 public health emergency.  Safety protocols were in place, including screening questions prior to the visit, additional usage of staff PPE, and extensive cleaning of exam room while observing appropriate contact time as indicated for disinfecting solutions.  Her father passed and condolences offered.  Discussed.  Tetanus 2013 PNA 2020 Shingles shot d/w pt.  Flu ASTM1962 covid vaccine 2021 Colonoscopy 2020 Mammogram comes through cancer and surgery clinic.  Pap not due at 63 DXA prev done 2020.  Declined prolia after talking to her dentist.   Diet and exercise d/w pt.   Living will d/w pt. Would have daughter Elsworth Soho if patient were incapacitated. HCV and HIV screening d/w pt. [facility], would have screening done then, ~1990s.  Pt stated that she has a cyst on her Lt wrist and it does get bigger at times and then smaller.  Not ttp.  See exam below.  Elevated Cholesterol: Using medications without problems: yes Muscle aches: no Diet compliance: d/w pt Exercise: d/w pt.    Hypertension:    Using medication without problems or lightheadedness: yes Chest pain with exertion:no Edema:no Short of breath:no  GERD.  Controlled with PPI.  D/w pt about trial of taper.  She will update me as needed.  She has yearly breast follow up, d/w pt. I will defer.  She agrees.  Diabetes:  No meds Hypoglycemic episodes: no Hyperglycemic episodes: no Feet problems: no Blood Sugars averaging: not checked.   eye exam within last year: done a few weeks ago.  [place].    PMH and SH reviewed  Meds, vitals, and allergies reviewed.   ROS: Per HPI unless specifically indicated in ROS section   GEN: nad, alert and oriented HEENT: ncat NECK: supple w/o LA CV: rrr. PULM: ctab, no inc wob ABD: soft, +bs EXT: no edema SKIN: no acute rash, small ganglion cyst on L wrist, not ttp.  No redness.  It transilluminates  normally.  Diabetic foot exam: Normal inspection No skin breakdown No calluses  Normal DP pulses Normal sensation to light touch and monofilament Nails normal

## 2020-09-11 DIAGNOSIS — M6740 Ganglion, unspecified site: Secondary | ICD-10-CM | POA: Insufficient documentation

## 2020-09-11 NOTE — Assessment & Plan Note (Signed)
Tetanus 2013 PNA 2020 Shingles shot d/w pt.  Flu [id number] covid vaccine 2021 Colonoscopy 2020 Mammogram comes through cancer and surgery clinic.  Pap not due at 63 DXA prev done 2020.  Declined prolia after talking to her dentist.   Diet and exercise d/w pt.   Living will d/w pt. Would have daughter Tina Delgado if patient were incapacitated. HCV and HIV screening d/w pt. She had [facility] blood [facility], would have screening done then, ~1990s.

## 2020-09-11 NOTE — Assessment & Plan Note (Signed)
No meds.  Continue work on diet and exercise.  She had eye exam done recently.  Recheck periodically.  She agrees with plan.

## 2020-09-11 NOTE — Assessment & Plan Note (Signed)
Continue pravastatin.  Continue work on diet and exercise.  She agrees.  Labs discussed with patient.

## 2020-09-11 NOTE — Assessment & Plan Note (Signed)
Living will d/w pt. Would have daughter Tina Delgado if patient were incapacitated.

## 2020-09-11 NOTE — Assessment & Plan Note (Signed)
Nonbothersome, would not aspirate.  If much more bothersome we can refer to the hand clinic for excision.  Discussed.

## 2020-09-11 NOTE — Assessment & Plan Note (Signed)
Controlled with PPI.  D/w pt about trial of taper.  She will update me as needed.

## 2020-09-11 NOTE — Assessment & Plan Note (Signed)
Continue metoprolol.  Continue work on diet and exercise.  She agrees.  Labs discussed with patient.

## 2020-09-25 ENCOUNTER — Other Ambulatory Visit: Payer: Self-pay

## 2020-09-25 DIAGNOSIS — M85851 Other specified disorders of bone density and structure, right thigh: Secondary | ICD-10-CM

## 2020-09-25 DIAGNOSIS — Z170 Estrogen receptor positive status [ER+]: Secondary | ICD-10-CM

## 2020-09-25 DIAGNOSIS — C50311 Malignant neoplasm of lower-inner quadrant of right female breast: Secondary | ICD-10-CM

## 2020-09-25 NOTE — Progress Notes (Signed)
[facility]  [address] [place], [place] [address] Phone: [phone]  Fax: [phone]   Clinic Day:  09/26/2020  Referring physician: Tonia Ghent, MD  Chief Complaint: Tina Delgado is a 64 y.o. female with a history of stage IIB left breast cancer and osteopeniawho is seen for 1 year assessment.  HPI: The patient was last seen in the medical oncology clinic on 09/27/2019 via telemedicine. At that time, she was doing well.  She denied any breast concerns. Hematocrit was 44.3, hemoglobin 14.7, platelets 247,000, WBC 12,700 (ANC 8,200). CMP was normal. CA27.29 was 12.8. We discussed yearly surveillance. She continued calcium and vitamin D. She had completed 5 years of anastrozole.  Diagnostic bilateral mammogram on 07/15/2020 revealed no evidence of malignancy.  During the interim, she has been "good." Her knee and shoulder pains have resolved.  The patient performs monthly breast self exams and denies any breast concerns. She is no longer taking Arimidex.  She is still taking calcium and vitamin D.    Past Medical History:  Diagnosis Date  . Allergy   . Arthritis    s/p knee injection per ortho  . Breast cancer ([place]) 2014   RT LUMPECTOMY  . Depression   . Diabetes mellitus without complication ([place])   . Dyspnea   . Elevated glucose 05/2003   106  . GERD (gastroesophageal reflux disease)   . Hyperlipidemia   . Hypertension   . Malignant neoplasm of upper-outer quadrant of female breast [facility]) July 20, 2013   histologic grade 3 invasive mammary carcinoma, 13 mm,  2/16 nodes positive, ER-positive, PR positive, HER-2/neu not amplified.T1c,N1a  . Mammographic microcalcification   . Morbid obesity ([place])   . Personal history of chemotherapy 2014   BREAST CA  . Personal history of radiation therapy 2014   BREAST CA  . Torn rotator cuff 12/2015   left    Past Surgical History:  Procedure Laterality Date  . BREAST  EXCISIONAL BIOPSY Right 06/2013   [facility], clear margins, LN positive  . BREAST SURGERY Right 07-20-13   Wide excision, SLN biopsy, axillary dissection.  . COLONOSCOPY  [id number]  . JOINT REPLACEMENT  07/30/2007   LTKR Dr Derrel Nip  . KNEE ARTHROSCOPY  03/04/2007   left Dr Derrel Nip  . PORTACATH PLACEMENT  2014  . s/p chemotherapy Right    right breast cancer   . s/p radiation therapy Right    Whole breast radiation, right.  [name] TOTAL KNEE ARTHROPLASTY Right 01/19/2017   Procedure: TOTAL KNEE ARTHROPLASTY;  Surgeon: Corky Mull, MD;  Location: [facility] ORS;  Service: Orthopedics;  Laterality: Right;  . TOTAL SHOULDER ARTHROPLASTY Left 2019  . TUBAL LIGATION  ~1986   BTL    Family History  Problem Relation Age of Onset  . Heart disease Mother        MI  . Depression Mother        paranoia  . Hypertension Mother   . Stroke Mother   . Parkinsonism Mother   . Heart disease Brother        MI PTCA at 42  . Heart disease Father   . Stomach cancer Maternal Grandfather 9  . Diabetes Maternal Grandfather   . Breast cancer Cousin   . Colon cancer Paternal Uncle   . Diabetes Maternal Aunt   . Diabetes Maternal Uncle   . Diabetes Maternal Uncle   . Esophageal cancer Neg Hx   . Rectal cancer Neg Hx  Social History:  reports that she has never smoked. She has never used smokeless tobacco. She reports current alcohol use. She reports that she does not use drugs. She lives in [place]. Her father passed away in 09-14-20. She is still working. The patient is alone today.  Allergies:  Allergies  Allergen Reactions  . Simvastatin Other (See Comments)    Myalgias.    . Latex Rash and Other (See Comments)    More adhesive allergy than latex.  Has never been RAST tested. Thinks paper tape is okay.  . Lisinopril Cough  . Sulfonamide Derivatives Hives and Other (See Comments)    REACTION: itching years ago    Current Medications: Current Outpatient Medications  Medication Sig Dispense Refill   . acetaminophen (TYLENOL) 500 MG tablet Take 1,000 mg every 6 (six) hours as needed by mouth for moderate pain or headache.     . Calcium Carb-Cholecalciferol (CALCIUM 600 + D PO) Take 1 tablet by mouth 2 (two) times daily.    [name] docusate sodium (COLACE) 100 MG capsule Take 100 mg daily as needed by mouth for mild constipation.    . fluticasone (FLONASE) 50 MCG/ACT nasal spray Place 1 spray into both nostrils daily as needed for allergies or rhinitis.     . Loratadine (CLARITIN PO) Take 1 tablet by mouth daily as needed.    . metoprolol tartrate (LOPRESSOR) 100 MG tablet Take 1 tablet (100 mg total) by mouth 2 (two) times daily. 180 tablet 3  . omeprazole (PRILOSEC) 20 MG capsule TAKE 1 CAPSULE BY MOUTH EVERY DAY 90 capsule 3  . pravastatin (PRAVACHOL) 20 MG tablet Take 1 tablet (20 mg total) by mouth daily. 90 tablet 3  . amoxicillin (AMOXIL) 500 MG capsule Take 2,000 mg See admin instructions by mouth. Take 2000 mg by mouth 1 hour prior to dental procdures (Patient not taking: Reported on 09/26/2020)     No current facility-administered medications for this visit.   Review of Systems  Constitutional: Positive for weight loss (4 lbs since 02/2020). Negative for chills, diaphoresis, fever and malaise/fatigue.       Feels "good".  HENT: Negative.  Negative for congestion, ear discharge, ear pain, hearing loss, nosebleeds, sinus pain, sore throat and tinnitus.   Eyes: Negative.  Negative for blurred vision.  Respiratory: Negative.  Negative for cough, hemoptysis, sputum production and shortness of breath.   Cardiovascular: Negative.  Negative for chest pain, palpitations, orthopnea, leg swelling and PND.  Gastrointestinal: Negative.  Negative for abdominal pain, blood in stool, constipation, diarrhea, heartburn, melena, nausea and vomiting.  Genitourinary: Negative.  Negative for dysuria, frequency, hematuria and urgency.  Musculoskeletal: Negative for back pain, joint pain, myalgias and neck  pain.  Skin: Negative.  Negative for itching and rash.  Neurological: Negative.  Negative for dizziness, sensory change, weakness and headaches.  Endo/Heme/Allergies: Negative.  Does not bruise/bleed easily.  Psychiatric/Behavioral: Negative.  Negative for depression and memory loss. The patient is not nervous/anxious and does not have insomnia.   All other systems reviewed and are negative.   Performance status (ECOG): 0  Physical Exam Vitals and nursing note reviewed.  Constitutional:      General: She is not in acute distress.    Appearance: She is well-developed. She is not diaphoretic.  HENT:     Head: Normocephalic and atraumatic.     Mouth/Throat:     Mouth: Mucous membranes are moist.     Pharynx: Oropharynx is clear.  Eyes:  General: No scleral icterus.    Extraocular Movements: Extraocular movements intact.     Conjunctiva/sclera: Conjunctivae normal.     Pupils: Pupils are equal, round, and reactive to light.     Comments: Glasses.  Cardiovascular:     Rate and Rhythm: Normal rate and regular rhythm.     Heart sounds: Normal heart sounds. No murmur heard.   Pulmonary:     Effort: Pulmonary effort is normal. No respiratory distress.     Breath sounds: Normal breath sounds. No wheezing or rales.  Chest:     Chest wall: No tenderness.     Breasts:        Right: Swelling (inferiorly, chronic) and skin change (scarring at 3 o clock, stable) present. No bleeding, mass, nipple discharge or tenderness.        Left: No swelling, bleeding, mass, nipple discharge, skin change or tenderness.  Abdominal:     General: Bowel sounds are normal. There is no distension.     Palpations: Abdomen is soft. There is no mass.     Tenderness: There is no abdominal tenderness. There is no guarding or rebound.  Musculoskeletal:        General: No swelling or tenderness. Normal range of motion.     Cervical back: Normal range of motion and neck supple.  Lymphadenopathy:     Head:      Right side of head: No preauricular, posterior auricular or occipital adenopathy.     Left side of head: No preauricular, posterior auricular or occipital adenopathy.     Cervical: No cervical adenopathy.     Upper Body:     Right upper body: No supraclavicular or axillary adenopathy.     Left upper body: No supraclavicular or axillary adenopathy.     Lower Body: No right inguinal adenopathy. No left inguinal adenopathy.  Skin:    General: Skin is warm and dry.  Neurological:     Mental Status: She is alert and oriented to person, place, and time.  Psychiatric:        Behavior: Behavior normal.        Thought Content: Thought content normal.        Judgment: Judgment normal.    Appointment on 09/26/2020  Component Date Value Ref Range Status  . Sodium 09/26/2020 138  135 - 145 mmol/L Final  . Potassium 09/26/2020 4.2  3.5 - 5.1 mmol/L Final  . Chloride 09/26/2020 97* 98 - 111 mmol/L Final  . CO2 09/26/2020 33* 22 - 32 mmol/L Final  . Glucose, Bld 09/26/2020 138* 70 - 99 mg/dL Final   Glucose reference range applies only to samples taken after fasting for at least 8 hours.  . BUN 09/26/2020 15  8 - 23 mg/dL Final  . Creatinine, Ser 09/26/2020 0.90  0.44 - 1.00 mg/dL Final  . Calcium 09/26/2020 9.7  8.9 - 10.3 mg/dL Final  . Total Protein 09/26/2020 7.2  6.5 - 8.1 g/dL Final  . Albumin 09/26/2020 3.6  3.5 - 5.0 g/dL Final  . AST 09/26/2020 20  15 - 41 U/L Final  . ALT 09/26/2020 25  0 - 44 U/L Final  . Alkaline Phosphatase 09/26/2020 73  38 - 126 U/L Final  . Total Bilirubin 09/26/2020 0.6  0.3 - 1.2 mg/dL Final  . GFR, Estimated 09/26/2020 >60  >60 mL/min Final   Comment: (NOTE) Calculated using the CKD-EPI Creatinine Equation (2021)   . Anion gap 09/26/2020 8  5 - 15 Final  Performed at [facility], [address] [place], [place] [address]  . WBC 09/26/2020 9.0  4.0 - 10.5 K/uL Final  . RBC 09/26/2020 5.19* 3.87 - 5.11 MIL/uL Final  . Hemoglobin 09/26/2020  14.9  12.0 - 15.0 g/dL Final  . HCT 09/26/2020 45.6  36 - 46 % Final  . MCV 09/26/2020 87.9  80.0 - 100.0 fL Final  . MCH 09/26/2020 28.7  26.0 - 34.0 pg Final  . MCHC 09/26/2020 32.7  30.0 - 36.0 g/dL Final  . RDW 09/26/2020 13.3  11.5 - 15.5 % Final  . Platelets 09/26/2020 260  150 - 400 K/uL Final  . nRBC 09/26/2020 0.0  0.0 - 0.2 % Final  . Neutrophils Relative % 09/26/2020 64  % Final  . Neutro Abs 09/26/2020 5.8  1.7 - 7.7 K/uL Final  . Lymphocytes Relative 09/26/2020 26  % Final  . Lymphs Abs 09/26/2020 2.3  0.7 - 4.0 K/uL Final  . Monocytes Relative 09/26/2020 6  % Final  . Monocytes Absolute 09/26/2020 0.6  0.1 - 1.0 K/uL Final  . Eosinophils Relative 09/26/2020 2  % Final  . Eosinophils Absolute 09/26/2020 0.2  0.0 - 0.5 K/uL Final  . Basophils Relative 09/26/2020 1  % Final  . Basophils Absolute 09/26/2020 0.1  0.0 - 0.1 K/uL Final  . Immature Granulocytes 09/26/2020 1  % Final  . Abs Immature Granulocytes 09/26/2020 0.07  0.00 - 0.07 K/uL Final   Performed at [facility], [address] [place], [place] [address]    Assessment:  Tina Delgado is a 64 y.o. female with a history of stage IIB right breast cancers/p lumpectomy on 05/2013. Pathology revealed a grade III 1.3 cm lesion with 2 of 16 lymph nodes positive. Pathologic stage was T1cN1M0lesion. Tumor was ER+/PR+and Her2/neu- .  She enrolled on NSABP B47clinical trial. She received 4 cycles of TC(shortened course due to adverse events) from 07/28/2013 to 10/27/2013. She received breast radiationfrom 12/18/2013 to 02/06/2014. In 2015, she took Femara(letrozole) x 1 month. She was switched to Arimidexsecondary to dysgeusia (metallic taste). She completed 5 years of Arimidex in 08/2019.  BCI testing on 10/07/2018 revealed a 13.1% (95% CI: 7.6%-18.3%) risk of late recurrence (years 5-10) and a low likelihood of benefit.  Diagnostic right mammogramon 07/27/2018 revealed the development  of probably benign calcifications along the surgical scar anterior to the lumpectomy site.  Diagnostic right mammogram on 01/26/2019 revealed probably benign calcifications in the anterior third of the medial right breast.  Bilateral mammogram on 07/14/2019 revealed probably benign right breast calcifications, likely evolving fat necrosis. There were no other suspicious findings in either breast.  Diagnostic bilateral mammogram on 07/15/2020 revealed no evidence of malignancy.  CA27.29has been followed: 14.7 on 02/21/2014, 12.4 on 05/09/2015, 10.6 on 10/07/2015, 18.1 on 02/10/2016, 15.0 on 08/12/2016, 18.5 on 02/08/2017, 17.4 on 08/12/2017, 11.2 on 02/24/2018, 9.4 on 09/01/2018, 9.8 on 03/24/2019, and 12.8 on 09/26/2019.  Bone density study on 11/15/2014 revealed osteopeniawith a T-score of -1.2 in right femur. Bone density on 03/16/2017 revealed osteopenia with a T-score of -1.4 in the right femur. Bone density on 04/03/2019 revealed osteopenia with a T-score of -1.4 at the right neck femur.  She is on calcium and vitamin D. She underwent right knee arthroplastyfor osteoarthritis on 01/19/2017.  Colonoscopy on 05/26/2019 revealed five 8 to 12 mm polyps in the transverse colon. There were three 6 to 7 mm polyps in the transverse colon, in the ascending colon and  in the cecum. There was one 4 mm polyp in the ascending colon.  Pathology revealed tubular adenoma(s) without high-grade dysplasia or malignancy.  Repeat colonoscopy is planned in 2022.  She received the [place] COVID-19 vaccination on 12/31/2019 and 01/24/2020.  Symptomatically, she feels "good."  She denies any breast concerns. She is no longer taking Arimidex.  Exam is stable.  Plan:  1.   Labs today: CBC with diff, CMP, CA27.29. 2.   Stage IIB right breast cancer Clinically, she is doing well Exam is stable. CA 27. 29 is 9.7 (normal). Bilateral diagnostic mammogram on 07/15/2020 revealed no evidence of malignancy. BCI testing  revealed low likelihood of benefit of extended adjuvant endocrine therapy.             She completed 5 years of Arimidex. Continue annual surveillance.      3.   Osteopenia   Bone density on 04/03/2019 revealed osteopenia with a T-score of -1.4 at the right neck femur.    Continue calcium and vitamin D.  4.   Bone density on 04/02/2021. 5.   Bilateral screening mammogram on 07/15/2021. 6.   RTC in 1 year for MD assessment, labs (CBC with diff, CMP, CA27.29), and review of bone density and mammogram.  I discussed the assessment and treatment plan with the patient.  The patient was provided an opportunity to ask questions and all were answered.  The patient agreed with the plan and demonstrated an understanding of the instructions.  The patient was advised to call back or seek an in person evaluation if the symptoms worsen or if the condition fails to improve as anticipated.   Nolon Stalls, MD, PhD  09/26/2020, 10:42 AM  I, Mirian Mo Tufford, am acting as Education administrator for [employer]. Mike Gip, MD, PhD.  I, Melissa C. Mike Gip, MD, have reviewed the above documentation for accuracy and completeness, and I agree with the above.

## 2020-09-26 ENCOUNTER — Inpatient Hospital Stay: Payer: Medicare Other

## 2020-09-26 ENCOUNTER — Other Ambulatory Visit: Payer: Self-pay

## 2020-09-26 ENCOUNTER — Inpatient Hospital Stay: Payer: Medicare Other | Attending: Hematology and Oncology | Admitting: Hematology and Oncology

## 2020-09-26 ENCOUNTER — Encounter: Payer: Self-pay | Admitting: Hematology and Oncology

## 2020-09-26 VITALS — BP 152/86 | HR 54 | Temp 98.5°F | Resp 18 | Wt 287.4 lb

## 2020-09-26 DIAGNOSIS — Z923 Personal history of irradiation: Secondary | ICD-10-CM | POA: Insufficient documentation

## 2020-09-26 DIAGNOSIS — Z96651 Presence of right artificial knee joint: Secondary | ICD-10-CM | POA: Insufficient documentation

## 2020-09-26 DIAGNOSIS — Z9221 Personal history of antineoplastic chemotherapy: Secondary | ICD-10-CM | POA: Diagnosis not present

## 2020-09-26 DIAGNOSIS — C50311 Malignant neoplasm of lower-inner quadrant of right female breast: Secondary | ICD-10-CM

## 2020-09-26 DIAGNOSIS — M8580 Other specified disorders of bone density and structure, unspecified site: Secondary | ICD-10-CM | POA: Diagnosis not present

## 2020-09-26 DIAGNOSIS — Z803 Family history of malignant neoplasm of breast: Secondary | ICD-10-CM | POA: Insufficient documentation

## 2020-09-26 DIAGNOSIS — Z170 Estrogen receptor positive status [ER+]: Secondary | ICD-10-CM | POA: Diagnosis not present

## 2020-09-26 DIAGNOSIS — Z800 Family history of malignant neoplasm of digestive organs: Secondary | ICD-10-CM | POA: Diagnosis not present

## 2020-09-26 DIAGNOSIS — M85851 Other specified disorders of bone density and structure, right thigh: Secondary | ICD-10-CM | POA: Diagnosis not present

## 2020-09-26 DIAGNOSIS — Z853 Personal history of malignant neoplasm of breast: Secondary | ICD-10-CM | POA: Insufficient documentation

## 2020-09-26 DIAGNOSIS — Z96612 Presence of left artificial shoulder joint: Secondary | ICD-10-CM | POA: Insufficient documentation

## 2020-09-26 LAB — COMPREHENSIVE METABOLIC PANEL
ALT: 25 U/L (ref 0–44)
AST: 20 U/L (ref 15–41)
Albumin: 3.6 g/dL (ref 3.5–5.0)
Alkaline Phosphatase: 73 U/L (ref 38–126)
Anion gap: 8 (ref 5–15)
BUN: 15 mg/dL (ref 8–23)
CO2: 33 mmol/L — ABNORMAL HIGH (ref 22–32)
Calcium: 9.7 mg/dL (ref 8.9–10.3)
Chloride: 97 mmol/L — ABNORMAL LOW (ref 98–111)
Creatinine, Ser: 0.9 mg/dL (ref 0.44–1.00)
GFR, Estimated: >60 mL/min (ref >60)
Glucose, Bld: 138 mg/dL — ABNORMAL HIGH (ref 70–99)
Potassium: 4.2 mmol/L (ref 3.5–5.1)
Sodium: 138 mmol/L (ref 135–145)
Total Bilirubin: 0.6 mg/dL (ref 0.3–1.2)
Total Protein: 7.2 g/dL (ref 6.5–8.1)

## 2020-09-26 LAB — CBC WITH DIFFERENTIAL/PLATELET
Abs Immature Granulocytes: 0.07 10*3/uL (ref 0.00–0.07)
Basophils Absolute: 0.1 10*3/uL (ref 0.0–0.1)
Basophils Relative: 1 %
Eosinophils Absolute: 0.2 10*3/uL (ref 0.0–0.5)
Eosinophils Relative: 2 %
HCT: 45.6 % (ref 36.0–46.0)
Hemoglobin: 14.9 g/dL (ref 12.0–15.0)
Immature Granulocytes: 1 %
Lymphocytes Relative: 26 %
Lymphs Abs: 2.3 10*3/uL (ref 0.7–4.0)
MCH: 28.7 pg (ref 26.0–34.0)
MCHC: 32.7 g/dL (ref 30.0–36.0)
MCV: 87.9 fL (ref 80.0–100.0)
Monocytes Absolute: 0.6 10*3/uL (ref 0.1–1.0)
Monocytes Relative: 6 %
Neutro Abs: 5.8 10*3/uL (ref 1.7–7.7)
Neutrophils Relative %: 64 %
Platelets: 260 10*3/uL (ref 150–400)
RBC: 5.19 MIL/uL — ABNORMAL HIGH (ref 3.87–5.11)
RDW: 13.3 % (ref 11.5–15.5)
WBC: 9 10*3/uL (ref 4.0–10.5)
nRBC: 0 % (ref 0.0–0.2)

## 2020-09-26 NOTE — Progress Notes (Signed)
Pt in for yearly follow up, denies any concerns today.

## 2020-09-27 LAB — CANCER ANTIGEN 27.29: CA 27.29: 9.7 U/mL (ref 0.0–38.6)

## 2020-09-27 LAB — CARBON MONOXIDE, BLOOD (PERFORMED AT REF LAB): Carbon Monoxide, Blood: 2.9 % (ref 0.0–3.6)

## 2020-11-04 ENCOUNTER — Encounter: Payer: Self-pay | Admitting: Family Medicine

## 2020-11-06 NOTE — Telephone Encounter (Signed)
Late entry.  Called patient today.  Talked with her about her situation.  She had a temperature of around 99 degrees a few days ago but no fevers in the meantime.  She has less than 1 week of symptoms.  Her symptoms have leveled off in the meantime.  She is not short of breath.  She is not in distress.  We talked about options.  She has a COVID test that is pending.  I think it makes sense to presume that she could at least possibly have COVID in the meantime.  Routine cautions regarding masking, etc. discussed with patient.  She will update [place] with her test result when that is available.  If she is doing worse in the meantime then she will update [place].  Given that she has mild symptoms at this point I think that supportive care is reasonable for now.  She agrees with plan and will update me as needed.  I thanked her for taking the call.

## 2021-03-10 ENCOUNTER — Ambulatory Visit (INDEPENDENT_AMBULATORY_CARE_PROVIDER_SITE_OTHER): Payer: Medicare Other | Admitting: Family Medicine

## 2021-03-10 ENCOUNTER — Other Ambulatory Visit: Payer: Self-pay

## 2021-03-10 ENCOUNTER — Encounter: Payer: Self-pay | Admitting: Family Medicine

## 2021-03-10 VITALS — BP 124/76 | HR 71 | Temp 97.2°F | Ht 65.0 in | Wt 285.0 lb

## 2021-03-10 DIAGNOSIS — E119 Type 2 diabetes mellitus without complications: Secondary | ICD-10-CM | POA: Diagnosis not present

## 2021-03-10 DIAGNOSIS — K219 Gastro-esophageal reflux disease without esophagitis: Secondary | ICD-10-CM | POA: Diagnosis not present

## 2021-03-10 LAB — POCT GLYCOSYLATED HEMOGLOBIN (HGB A1C): Hemoglobin A1C: 6.2 % — AB (ref 4.0–5.6)

## 2021-03-10 MED ORDER — OMEPRAZOLE 20 MG PO CPDR
DELAYED_RELEASE_CAPSULE | ORAL | 3 refills | Status: DC
Start: 2021-03-10 — End: 2021-10-23

## 2021-03-10 NOTE — Patient Instructions (Signed)
Don't change your meds for now.  Thanks for your effort.  Plan on recheck in about 6 months at a yearly visit with labs ahead of time.  Take care.  Glad to see you.

## 2021-03-10 NOTE — Progress Notes (Signed)
This visit occurred during the SARS-CoV-2 public health emergency.  Safety protocols were in place, including screening questions prior to the visit, additional usage of staff PPE, and extensive cleaning of exam room while observing appropriate contact time as indicated for disinfecting solutions.  Diabetes:  No meds.   Hypoglycemic episodes: no Hyperglycemic episodes:no Feet problems:no Blood Sugars averaging: not checked.  eye exam within last year: yes  She failed full taper of PPI but got some relief with PRN/intermittent use, d/w pt about trying to taper/use prn.    Meds, vitals, and allergies reviewed.   ROS: Per HPI unless specifically indicated in ROS section   GEN: nad, alert and oriented HEENT: ncat NECK: supple w/o LA CV: rrr. PULM: ctab, no inc wob ABD: soft, +bs EXT: no edema SKIN: well perfused.   Diabetic foot exam: Normal inspection No skin breakdown Small L 1st MT callus w/o ulceration.   Normal DP pulses Normal sensation to light touch and monofilament Nails normal except for L 1st nail slightly thick

## 2021-03-12 NOTE — Assessment & Plan Note (Signed)
Continue with intermittent PPI use and update me as needed.

## 2021-03-12 NOTE — Assessment & Plan Note (Signed)
History of, A1c improved.  I thanked her for her effort.  Continue as is and plan on recheck later this year.  She agrees with plan.  See after visit summary.

## 2021-04-03 ENCOUNTER — Ambulatory Visit
Admission: RE | Admit: 2021-04-03 | Discharge: 2021-04-03 | Disposition: A | Discharge status: Home / Self Care | Payer: Medicare Other | Source: Ambulatory Visit | Attending: Hematology and Oncology | Admitting: Hematology and Oncology

## 2021-04-03 ENCOUNTER — Other Ambulatory Visit: Payer: Self-pay

## 2021-04-03 DIAGNOSIS — M85851 Other specified disorders of bone density and structure, right thigh: Secondary | ICD-10-CM | POA: Diagnosis not present

## 2021-07-18 ENCOUNTER — Ambulatory Visit
Admission: RE | Admit: 2021-07-18 | Discharge: 2021-07-18 | Disposition: A | Discharge status: Home / Self Care | Payer: Medicare Other | Source: Ambulatory Visit | Attending: Hematology and Oncology | Admitting: Hematology and Oncology

## 2021-07-18 ENCOUNTER — Other Ambulatory Visit: Payer: Self-pay | Admitting: Student

## 2021-07-18 ENCOUNTER — Ambulatory Visit
Admission: RE | Admit: 2021-07-18 | Discharge: 2021-07-18 | Disposition: A | Discharge status: Home / Self Care | Payer: Medicare Other | Source: Ambulatory Visit | Attending: Student | Admitting: Student

## 2021-07-18 ENCOUNTER — Other Ambulatory Visit: Payer: Self-pay

## 2021-07-18 ENCOUNTER — Encounter: Payer: Self-pay | Admitting: Family Medicine

## 2021-07-18 DIAGNOSIS — R221 Localized swelling, mass and lump, neck: Secondary | ICD-10-CM

## 2021-07-18 DIAGNOSIS — R222 Localized swelling, mass and lump, trunk: Secondary | ICD-10-CM | POA: Insufficient documentation

## 2021-07-18 DIAGNOSIS — C50311 Malignant neoplasm of lower-inner quadrant of right female breast: Secondary | ICD-10-CM | POA: Diagnosis present

## 2021-07-18 DIAGNOSIS — Z853 Personal history of malignant neoplasm of breast: Secondary | ICD-10-CM

## 2021-07-18 DIAGNOSIS — J189 Pneumonia, unspecified organism: Secondary | ICD-10-CM

## 2021-07-18 DIAGNOSIS — Z170 Estrogen receptor positive status [ER+]: Secondary | ICD-10-CM | POA: Diagnosis present

## 2021-07-21 NOTE — Telephone Encounter (Signed)
[facility] note-  3:49 PM: received x-ray report - RLL infiltrate. Continue abx treatment. Repeat x-ray in 6-8 weeks. Soft tissue US neck results returned - 1x.7x.7cm heterogenous primarily hyperechoic subcutaneous focus with irregular margins. Could be a complex fluid collect/abscess, necrotic mass, or abnormal necrotic lymph node, recommend contrast CT soft tissue neck. Discussed results with patient. Discussed obtaining CT neck through our clinic vs PCP vs oncology. At this time, patient would like to proceed with PCP office for further imaging. In meantime, continue antibiotics and warm compresses. Return if any new/worsening sxs. Patient voiced understanding.   ==================== Please call pt.  Will need f/u CXR in 6 weeks.  I put in the order for that.  If not feeling better soon, then needs recheck.    CT ordered re: lesion seen on u/s.    Make sure that when she goes for the CT, they shouldn't do the CXR.  The CXR needs to be done late October or early November.  Thanks.

## 2021-07-21 NOTE — Telephone Encounter (Signed)
Patient called stating that she was expecting a call today. Patient was advised that a referral has been placed and she should receive  a call from the referral coordinators to get the test scheduled.

## 2021-07-22 NOTE — Telephone Encounter (Signed)
Patient was called and given information needed to call and schedule her CT scan.

## 2021-07-27 ENCOUNTER — Encounter: Payer: Self-pay | Admitting: Gastroenterology

## 2021-07-29 ENCOUNTER — Encounter: Payer: Self-pay | Admitting: Family Medicine

## 2021-07-30 ENCOUNTER — Other Ambulatory Visit: Payer: Self-pay | Admitting: Family Medicine

## 2021-07-30 MED ORDER — FLUCONAZOLE 150 MG PO TABS: ORAL_TABLET | ORAL | 0 refills | Status: DC

## 2021-08-07 ENCOUNTER — Other Ambulatory Visit: Payer: Self-pay

## 2021-08-07 ENCOUNTER — Ambulatory Visit
Admission: RE | Admit: 2021-08-07 | Discharge: 2021-08-07 | Disposition: A | Discharge status: Home / Self Care | Payer: Medicare Other | Source: Ambulatory Visit | Attending: Family Medicine | Admitting: Family Medicine

## 2021-08-07 DIAGNOSIS — R221 Localized swelling, mass and lump, neck: Secondary | ICD-10-CM | POA: Insufficient documentation

## 2021-08-07 LAB — POCT I-STAT CREATININE: Creatinine, Ser: 0.8 mg/dL (ref 0.44–1.00)

## 2021-08-07 MED ORDER — IOHEXOL 300 MG/ML  SOLN
75.0000 mL | Freq: Once | INTRAMUSCULAR | Status: AC | PRN
  Administered 2021-08-07: 75 mL via INTRAVENOUS

## 2021-08-08 ENCOUNTER — Other Ambulatory Visit: Payer: Self-pay | Admitting: Family Medicine

## 2021-08-08 DIAGNOSIS — R9389 Abnormal findings on diagnostic imaging of other specified body structures: Secondary | ICD-10-CM

## 2021-08-08 MED ORDER — CEPHALEXIN 500 MG PO CAPS: 500.0000 mg | ORAL_CAPSULE | Freq: Three times a day (TID) | ORAL | 0 refills | Status: DC

## 2021-08-11 LAB — HM DIABETES EYE EXAM

## 2021-09-05 ENCOUNTER — Other Ambulatory Visit: Payer: PRIVATE HEALTH INSURANCE

## 2021-09-11 ENCOUNTER — Other Ambulatory Visit: Payer: Self-pay | Admitting: Family Medicine

## 2021-09-12 ENCOUNTER — Encounter: Payer: PRIVATE HEALTH INSURANCE | Admitting: Family Medicine

## 2021-09-25 ENCOUNTER — Other Ambulatory Visit: Payer: Self-pay | Admitting: *Deleted

## 2021-09-25 DIAGNOSIS — C50311 Malignant neoplasm of lower-inner quadrant of right female breast: Secondary | ICD-10-CM

## 2021-09-30 ENCOUNTER — Other Ambulatory Visit: Payer: Self-pay

## 2021-09-30 ENCOUNTER — Encounter: Payer: Self-pay | Admitting: Family Medicine

## 2021-09-30 ENCOUNTER — Encounter: Payer: Self-pay | Admitting: Oncology

## 2021-09-30 ENCOUNTER — Inpatient Hospital Stay (HOSPITAL_BASED_OUTPATIENT_CLINIC_OR_DEPARTMENT_OTHER): Payer: Medicare Other | Admitting: Oncology

## 2021-09-30 ENCOUNTER — Ambulatory Visit: Payer: PRIVATE HEALTH INSURANCE | Admitting: Oncology

## 2021-09-30 ENCOUNTER — Other Ambulatory Visit: Payer: PRIVATE HEALTH INSURANCE

## 2021-09-30 ENCOUNTER — Inpatient Hospital Stay: Payer: Medicare Other | Attending: Oncology

## 2021-09-30 VITALS — BP 161/84 | HR 58 | Temp 97.1°F | Resp 18 | Wt 281.1 lb

## 2021-09-30 DIAGNOSIS — E559 Vitamin D deficiency, unspecified: Secondary | ICD-10-CM | POA: Insufficient documentation

## 2021-09-30 DIAGNOSIS — D751 Secondary polycythemia: Secondary | ICD-10-CM | POA: Insufficient documentation

## 2021-09-30 DIAGNOSIS — Z86010 Personal history of colonic polyps: Secondary | ICD-10-CM | POA: Insufficient documentation

## 2021-09-30 DIAGNOSIS — M85851 Other specified disorders of bone density and structure, right thigh: Secondary | ICD-10-CM | POA: Diagnosis not present

## 2021-09-30 DIAGNOSIS — Z1231 Encounter for screening mammogram for malignant neoplasm of breast: Secondary | ICD-10-CM

## 2021-09-30 DIAGNOSIS — C50311 Malignant neoplasm of lower-inner quadrant of right female breast: Secondary | ICD-10-CM | POA: Insufficient documentation

## 2021-09-30 DIAGNOSIS — Z800 Family history of malignant neoplasm of digestive organs: Secondary | ICD-10-CM | POA: Insufficient documentation

## 2021-09-30 DIAGNOSIS — M85859 Other specified disorders of bone density and structure, unspecified thigh: Secondary | ICD-10-CM

## 2021-09-30 DIAGNOSIS — Z170 Estrogen receptor positive status [ER+]: Secondary | ICD-10-CM | POA: Diagnosis not present

## 2021-09-30 DIAGNOSIS — Z79811 Long term (current) use of aromatase inhibitors: Secondary | ICD-10-CM | POA: Insufficient documentation

## 2021-09-30 LAB — CBC WITH DIFFERENTIAL/PLATELET
Abs Immature Granulocytes: 0.06 10*3/uL (ref 0.00–0.07)
Basophils Absolute: 0.1 10*3/uL (ref 0.0–0.1)
Basophils Relative: 1 %
Eosinophils Absolute: 0.3 10*3/uL (ref 0.0–0.5)
Eosinophils Relative: 3 %
HCT: 46.3 % — ABNORMAL HIGH (ref 36.0–46.0)
Hemoglobin: 15.3 g/dL — ABNORMAL HIGH (ref 12.0–15.0)
Immature Granulocytes: 1 %
Lymphocytes Relative: 22 %
Lymphs Abs: 1.9 10*3/uL (ref 0.7–4.0)
MCH: 29.9 pg (ref 26.0–34.0)
MCHC: 33 g/dL (ref 30.0–36.0)
MCV: 90.6 fL (ref 80.0–100.0)
Monocytes Absolute: 0.6 10*3/uL (ref 0.1–1.0)
Monocytes Relative: 8 %
Neutro Abs: 5.6 10*3/uL (ref 1.7–7.7)
Neutrophils Relative %: 65 %
Platelets: 235 10*3/uL (ref 150–400)
RBC: 5.11 MIL/uL (ref 3.87–5.11)
RDW: 12.9 % (ref 11.5–15.5)
WBC: 8.6 10*3/uL (ref 4.0–10.5)
nRBC: 0 % (ref 0.0–0.2)

## 2021-09-30 LAB — COMPREHENSIVE METABOLIC PANEL
ALT: 24 U/L (ref 0–44)
AST: 26 U/L (ref 15–41)
Albumin: 3.8 g/dL (ref 3.5–5.0)
Alkaline Phosphatase: 69 U/L (ref 38–126)
Anion gap: 10 (ref 5–15)
BUN: 12 mg/dL (ref 8–23)
CO2: 33 mmol/L — ABNORMAL HIGH (ref 22–32)
Calcium: 9.4 mg/dL (ref 8.9–10.3)
Chloride: 98 mmol/L (ref 98–111)
Creatinine, Ser: 0.92 mg/dL (ref 0.44–1.00)
GFR, Estimated: >60 mL/min (ref >60)
Glucose, Bld: 132 mg/dL — ABNORMAL HIGH (ref 70–99)
Potassium: 4.6 mmol/L (ref 3.5–5.1)
Sodium: 141 mmol/L (ref 135–145)
Total Bilirubin: 0.6 mg/dL (ref 0.3–1.2)
Total Protein: 7.2 g/dL (ref 6.5–8.1)

## 2021-09-30 NOTE — Progress Notes (Signed)
Hematology oncology progress note   Clinic Day:  09/30/2021  Referring physician: Tonia Ghent, MD  Chief Complaint: Tina Delgado is a 65 y.o. female with a history of stage IIB left breast cancer and osteopenia who is seen for 1 year assessment.  PERTINENT ONCOLOGY HISTORY Tina Delgado is a 65 y.o.afemale who has above oncology history reviewed by me today presented for follow up visit for history of stage IIB left breast cancer and osteopenia Patient previously followed up by Dr.Corcoran, patient switched care to me on 09/30/21 Extensive medical record review was performed by me  #History of stage IIB right breast cancer s/p lumpectomy on 05/2013.  Pathology revealed a grade III 1.3 cm lesion with 2 of 16 lymph nodes positive.  Pathologic stage was T1cN1M0 lesion.  Tumor was ER+/PR+ and Her2/neu- . Patient was enrolled on NSABP B 47 clinical trial.  Patient had adjuvant chemotherapy with TC x4. She also received adjuvant radiation. Started on letrozole in 2015 for about a month, switched to Arimidex due to dysgeusia (metallic taste) Patient completed 5 years of Arimidex November 2020. Patient had BCI testing on 10/07/2018 which revealed a 13.1% risk of late recurrence during year 5. 10.  Low likelihood of benefit of extended endocrine therapy.  Patient has annual mammogram done for surveillance.  #Osteopenia 11/15/2014 bone density study showed osteopenia  03/17/2017 reviewed osteopenia  04/03/2019 bone density revealed osteopenia  04/09/2021, bone density reviewed osteopenia  10-year probability of fracture 12.4%. Patient opted not to proceed with bone strengthening agents due to chronic dental problems.  Colonoscopy on 05/26/2019 revealed five 8 to 12 mm polyps in the transverse colon. There were three 6 to 7 mm polyps in the transverse colon, in the ascending colon and in the cecum. There was one 4 mm polyp in the ascending colon.  Pathology revealed tubular  adenoma(s) without high-grade dysplasia or malignancy.  Repeat colonoscopy is planned in 2022   Today patient reports feeling well.  Patient takes Arimidex 1 mg daily.  Manageable side effects.  Past Medical History:  Diagnosis Date   Allergy    Arthritis    s/p knee injection per ortho   Breast cancer ([place]) 2014   RT LUMPECTOMY   Depression    Diabetes mellitus without complication ([place])    Dyspnea    Elevated glucose 05/2003   106   GERD (gastroesophageal reflux disease)    Hyperlipidemia    Hypertension    Malignant neoplasm of upper-outer quadrant of female breast ([place]) July 20, 2013   histologic grade 3 invasive mammary carcinoma, 13 mm,  2/16 nodes positive, ER-positive, PR positive, HER-2/neu not amplified.T1c,N1a   Mammographic microcalcification    Morbid obesity ([place])    Personal history of chemotherapy 2014   BREAST CA   Personal history of radiation therapy 2014   BREAST CA   Torn rotator cuff 12/2015   left    Past Surgical History:  Procedure Laterality Date   BREAST EXCISIONAL BIOPSY Right 06/2013   [facility], clear margins, LN positive   BREAST SURGERY Right 07-20-13   Wide excision, SLN biopsy, axillary dissection.   COLONOSCOPY  [id number]   JOINT REPLACEMENT  07/30/2007   LTKR Dr Derrel Nip   KNEE ARTHROSCOPY  03/04/2007   left Dr Derrel Nip   [facility] PLACEMENT  2014   s/p chemotherapy Right    right breast cancer    s/p radiation therapy Right    Whole breast radiation, right.   TOTAL KNEE ARTHROPLASTY Right  01/19/2017   Procedure: TOTAL KNEE ARTHROPLASTY;  Surgeon: Corky Mull, MD;  Location: [facility] ORS;  Service: Orthopedics;  Laterality: Right;   TOTAL SHOULDER ARTHROPLASTY Left 2019   TUBAL LIGATION  ~1986   BTL    Family History  Problem Relation Age of Onset   Heart disease Mother        MI   Depression Mother        paranoia   Hypertension Mother    Stroke Mother    Parkinsonism Mother    Heart disease Brother        MI PTCA at 61   Heart  disease Father    Stomach cancer Maternal Grandfather 72   Diabetes Maternal Grandfather    Breast cancer Cousin    Colon cancer Paternal Uncle    Diabetes Maternal Aunt    Diabetes Maternal Uncle    Diabetes Maternal Uncle    Esophageal cancer Neg Hx    Rectal cancer Neg Hx     Social History:  reports that she has never smoked. She has never used smokeless tobacco. She reports current alcohol use. She reports that she does not use drugs.  Allergies:  Allergies  Allergen Reactions   Simvastatin Other (See Comments)    Myalgias.     Latex Rash and Other (See Comments)    More adhesive allergy than latex.  Has never been RAST tested. Thinks paper tape is okay.   Lisinopril Cough   Sulfonamide Derivatives Hives and Other (See Comments)    REACTION: itching years ago    Current Medications: Current Outpatient Medications  Medication Sig Dispense Refill   acetaminophen (TYLENOL) 500 MG tablet Take 1,000 mg every 6 (six) hours as needed by mouth for moderate pain or headache.      Calcium Carb-Cholecalciferol (CALCIUM 600 + D PO) Take 1 tablet by mouth 2 (two) times daily.     docusate sodium (COLACE) 100 MG capsule Take 100 mg daily as needed by mouth for mild constipation.     Fiber Adult Gummies 2 g CHEW Chew by mouth.     fluticasone (FLONASE) 50 MCG/ACT nasal spray Place 1 spray into both nostrils daily as needed for allergies or rhinitis.      Loratadine (CLARITIN PO) Take 1 tablet by mouth daily as needed.     metoprolol tartrate (LOPRESSOR) 100 MG tablet TAKE 1 TABLET BY MOUTH TWICE A DAY 180 tablet 1   omeprazole (PRILOSEC) 20 MG capsule TAKE 1 CAPSULE BY MOUTH as needed 90 capsule 3   pravastatin (PRAVACHOL) 20 MG tablet Take 1 tablet (20 mg total) by mouth daily. 90 tablet 3   Probiotic Product (PROBIOTIC-10 PO) Take by mouth.     amoxicillin (AMOXIL) 500 MG capsule Take 2,000 mg by mouth See admin instructions. Take 2000 mg by mouth 1 hour prior to dental procdures  (Patient not taking: Reported on 09/30/2021)     No current facility-administered medications for this visit.   Review of Systems  Constitutional:  Positive for weight loss. Negative for chills, diaphoresis, fever and malaise/fatigue.       Patient reports feeling well.  She has lost 4 pounds since last visit  HENT: Negative.  Negative for congestion, ear discharge, ear pain, hearing loss, nosebleeds, sinus pain, sore throat and tinnitus.   Eyes: Negative.  Negative for blurred vision.  Respiratory: Negative.  Negative for cough, hemoptysis, sputum production and shortness of breath.   Cardiovascular: Negative.  Negative for chest pain,  palpitations, orthopnea, leg swelling and PND.  Gastrointestinal: Negative.  Negative for abdominal pain, blood in stool, constipation, diarrhea, heartburn, melena, nausea and vomiting.  Genitourinary: Negative.  Negative for dysuria, frequency, hematuria and urgency.  Musculoskeletal:  Negative for back pain, joint pain, myalgias and neck pain.  Skin: Negative.  Negative for itching and rash.  Neurological: Negative.  Negative for dizziness, sensory change, weakness and headaches.  Endo/Heme/Allergies: Negative.  Does not bruise/bleed easily.  Psychiatric/Behavioral: Negative.  Negative for depression and memory loss. The patient is not nervous/anxious and does not have insomnia.   All other systems reviewed and are negative.  Performance status (ECOG): 0  Physical Exam Vitals and nursing note reviewed.  Constitutional:      General: She is not in acute distress.    Appearance: She is well-developed. She is obese. She is not diaphoretic.  HENT:     Head: Normocephalic and atraumatic.     Mouth/Throat:     Mouth: Mucous membranes are moist.     Pharynx: Oropharynx is clear.  Eyes:     General: No scleral icterus.    Pupils: Pupils are equal, round, and reactive to light.  Cardiovascular:     Rate and Rhythm: Normal rate and regular rhythm.     Heart  sounds: Normal heart sounds. No murmur heard. Pulmonary:     Effort: Pulmonary effort is normal. No respiratory distress.     Breath sounds: Normal breath sounds. No wheezing or rales.  Chest:     Chest wall: No tenderness.  Breasts:    Right: Skin change (scarring at 3 o clock, stable) present. No swelling, bleeding, mass, nipple discharge or tenderness.     Left: No swelling, bleeding, mass, nipple discharge, skin change or tenderness.  Abdominal:     General: Bowel sounds are normal. There is no distension.     Palpations: Abdomen is soft. There is no mass.     Tenderness: There is no abdominal tenderness. There is no guarding or rebound.  Musculoskeletal:        General: No swelling or tenderness. Normal range of motion.     Cervical back: Normal range of motion and neck supple.  Lymphadenopathy:     Head:     Right side of head: No preauricular, posterior auricular or occipital adenopathy.     Left side of head: No preauricular, posterior auricular or occipital adenopathy.     Cervical: No cervical adenopathy.     Upper Body:     Right upper body: No supraclavicular or axillary adenopathy.     Left upper body: No supraclavicular or axillary adenopathy.     Lower Body: No right inguinal adenopathy. No left inguinal adenopathy.  Skin:    General: Skin is warm and dry.  Neurological:     Mental Status: She is alert and oriented to person, place, and time.  Psychiatric:        Mood and Affect: Mood normal.   Clinical Support on 09/30/2021  Component Date Value Ref Range Status   Sodium 09/30/2021 141  135 - 145 mmol/L Final   Potassium 09/30/2021 4.6  3.5 - 5.1 mmol/L Final   Chloride 09/30/2021 98  98 - 111 mmol/L Final   CO2 09/30/2021 33 (H)  22 - 32 mmol/L Final   Glucose, Bld 09/30/2021 132 (H)  70 - 99 mg/dL Final   Glucose reference range applies only to samples taken after fasting for at least 8 hours.   BUN 09/30/2021  12  8 - 23 mg/dL Final   Creatinine, Ser  09/30/2021 0.92  0.44 - 1.00 mg/dL Final   Calcium 09/30/2021 9.4  8.9 - 10.3 mg/dL Final   Total Protein 09/30/2021 7.2  6.5 - 8.1 g/dL Final   Albumin 09/30/2021 3.8  3.5 - 5.0 g/dL Final   AST 09/30/2021 26  15 - 41 U/L Final   ALT 09/30/2021 24  0 - 44 U/L Final   Alkaline Phosphatase 09/30/2021 69  38 - 126 U/L Final   Total Bilirubin 09/30/2021 0.6  0.3 - 1.2 mg/dL Final   GFR, Estimated 09/30/2021 >60  >60 mL/min Final   Comment: (NOTE) Calculated using the CKD-EPI Creatinine Equation (2021)    Anion gap 09/30/2021 10  5 - 15 Final   Performed at [facility], [place], [place] [address]   WBC 09/30/2021 8.6  4.0 - 10.5 K/uL Final   RBC 09/30/2021 5.11  3.87 - 5.11 MIL/uL Final   Hemoglobin 09/30/2021 15.3 (H)  12.0 - 15.0 g/dL Final   HCT 09/30/2021 46.3 (H)  36.0 - 46.0 % Final   MCV 09/30/2021 90.6  80.0 - 100.0 fL Final   MCH 09/30/2021 29.9  26.0 - 34.0 pg Final   MCHC 09/30/2021 33.0  30.0 - 36.0 g/dL Final   RDW 09/30/2021 12.9  11.5 - 15.5 % Final   Platelets 09/30/2021 235  150 - 400 K/uL Final   nRBC 09/30/2021 0.0  0.0 - 0.2 % Final   Neutrophils Relative % 09/30/2021 65  % Final   Neutro Abs 09/30/2021 5.6  1.7 - 7.7 K/uL Final   Lymphocytes Relative 09/30/2021 22  % Final   Lymphs Abs 09/30/2021 1.9  0.7 - 4.0 K/uL Final   Monocytes Relative 09/30/2021 8  % Final   Monocytes Absolute 09/30/2021 0.6  0.1 - 1.0 K/uL Final   Eosinophils Relative 09/30/2021 3  % Final   Eosinophils Absolute 09/30/2021 0.3  0.0 - 0.5 K/uL Final   Basophils Relative 09/30/2021 1  % Final   Basophils Absolute 09/30/2021 0.1  0.0 - 0.1 K/uL Final   Immature Granulocytes 09/30/2021 1  % Final   Abs Immature Granulocytes 09/30/2021 0.06  0.00 - 0.07 K/uL Final   Performed at [facility], [address] [place], [place] [address]    Assessment and plan,  1. Encounter for screening for malignant neoplasm of breast, unspecified screening modality   2.  Malignant neoplasm of lower-inner quadrant of right breast of female, estrogen receptor positive ([place])   3. Osteopenia of neck of right femur   4. Screening mammogram, encounter for   5. Vitamin D deficiency     #History of stage IIb right breast cancer, ER/PR positive, HER2 negativestatus post lumpectomy and sentinel lymph node biopsy, adjuvant chemotherapy and adjuvant radiation.  Currently on adjuvant endocrine therapy with Arimidex.  Overall she tolerates well with manageable side effects. Recommend patient to continue annual screening mammogram. Most recent mammogram results were reviewed and discussed with patient.No mammographic evidence of malignancy. Repeat mammogram in 1 year.-Patient prefers to continue follow-up with oncology clinic for surveillance.  #Osteopenia, chronic Recommended patient to continue oral calcium and vitamin D supplementation. Patient declined bisphosphonate treatments.  #History of vitamin D deficiency.  We will check level at next visit. #Mild erythrocytosis, monitor.  Likely reactive.  If persistent, will trigger erythrocytosis work-up  RTC in 1 year for MD assessment, labs (CBC with diff, CMP, CA27.29), and review of  bone density and mammogram.  I discussed the assessment and treatment plan with the patient.  The patient was provided an opportunity to ask questions and all were answered.  The patient agreed with the plan and demonstrated an understanding of the instructions.  The patient was advised to call back or seek an in person evaluation if the symptoms worsen or if the condition fails to improve as anticipated.  Earlie Server, MD, PhD [facility] Hematology Oncology 09/30/2021

## 2021-09-30 NOTE — Progress Notes (Signed)
Pt here for follow up. No new concerns voiced.   

## 2021-10-01 ENCOUNTER — Other Ambulatory Visit: Payer: Self-pay | Admitting: Family Medicine

## 2021-10-01 DIAGNOSIS — R739 Hyperglycemia, unspecified: Secondary | ICD-10-CM

## 2021-10-01 DIAGNOSIS — E785 Hyperlipidemia, unspecified: Secondary | ICD-10-CM

## 2021-10-01 DIAGNOSIS — R7989 Other specified abnormal findings of blood chemistry: Secondary | ICD-10-CM

## 2021-10-01 LAB — CANCER ANTIGEN 27.29: CA 27.29: 10.2 U/mL (ref 0.0–38.6)

## 2021-10-07 ENCOUNTER — Other Ambulatory Visit: Payer: Self-pay | Admitting: Family Medicine

## 2021-10-13 ENCOUNTER — Other Ambulatory Visit: Payer: Self-pay

## 2021-10-13 ENCOUNTER — Other Ambulatory Visit: Payer: PRIVATE HEALTH INSURANCE

## 2021-10-13 ENCOUNTER — Other Ambulatory Visit (INDEPENDENT_AMBULATORY_CARE_PROVIDER_SITE_OTHER): Payer: Medicare Other

## 2021-10-13 DIAGNOSIS — R7989 Other specified abnormal findings of blood chemistry: Secondary | ICD-10-CM | POA: Diagnosis not present

## 2021-10-13 DIAGNOSIS — E785 Hyperlipidemia, unspecified: Secondary | ICD-10-CM

## 2021-10-13 DIAGNOSIS — R739 Hyperglycemia, unspecified: Secondary | ICD-10-CM

## 2021-10-14 LAB — LIPID PANEL
Cholesterol: 187 mg/dL (ref 0–200)
HDL: 38.8 mg/dL — ABNORMAL LOW (ref >39.00)
LDL Cholesterol: 118 mg/dL — ABNORMAL HIGH (ref 0–99)
NonHDL: 148.67
Total CHOL/HDL Ratio: 5
Triglycerides: 154 mg/dL — ABNORMAL HIGH (ref 0.0–149.0)
VLDL: 30.8 mg/dL (ref 0.0–40.0)

## 2021-10-14 LAB — MICROALBUMIN / CREATININE URINE RATIO
Creatinine,U: 203.3 mg/dL
Microalb Creat Ratio: 0.8 mg/g (ref 0.0–30.0)
Microalb, Ur: 1.6 mg/dL (ref 0.0–1.9)

## 2021-10-14 LAB — HEMOGLOBIN A1C: Hgb A1c MFr Bld: 6.3 % (ref 4.6–6.5)

## 2021-10-14 LAB — VITAMIN D 25 HYDROXY (VIT D DEFICIENCY, FRACTURES): VITD: 39.73 ng/mL (ref 30.00–100.00)

## 2021-10-23 ENCOUNTER — Other Ambulatory Visit: Payer: Self-pay

## 2021-10-23 ENCOUNTER — Ambulatory Visit (INDEPENDENT_AMBULATORY_CARE_PROVIDER_SITE_OTHER)
Admission: RE | Admit: 2021-10-23 | Discharge: 2021-10-23 | Disposition: A | Discharge status: Home / Self Care | Payer: Medicare Other | Source: Ambulatory Visit | Attending: Family Medicine | Admitting: Family Medicine

## 2021-10-23 ENCOUNTER — Encounter: Payer: Self-pay | Admitting: Family Medicine

## 2021-10-23 ENCOUNTER — Ambulatory Visit (INDEPENDENT_AMBULATORY_CARE_PROVIDER_SITE_OTHER): Payer: Medicare Other | Admitting: Family Medicine

## 2021-10-23 VITALS — BP 122/82 | HR 76 | Temp 97.1°F | Ht 65.0 in | Wt 279.0 lb

## 2021-10-23 DIAGNOSIS — K219 Gastro-esophageal reflux disease without esophagitis: Secondary | ICD-10-CM

## 2021-10-23 DIAGNOSIS — R739 Hyperglycemia, unspecified: Secondary | ICD-10-CM | POA: Diagnosis not present

## 2021-10-23 DIAGNOSIS — Z7189 Other specified counseling: Secondary | ICD-10-CM

## 2021-10-23 DIAGNOSIS — Z23 Encounter for immunization: Secondary | ICD-10-CM | POA: Diagnosis not present

## 2021-10-23 DIAGNOSIS — I10 Essential (primary) hypertension: Secondary | ICD-10-CM | POA: Diagnosis not present

## 2021-10-23 DIAGNOSIS — J189 Pneumonia, unspecified organism: Secondary | ICD-10-CM

## 2021-10-23 DIAGNOSIS — Z0000 Encounter for general adult medical examination without abnormal findings: Secondary | ICD-10-CM

## 2021-10-23 DIAGNOSIS — E785 Hyperlipidemia, unspecified: Secondary | ICD-10-CM | POA: Diagnosis not present

## 2021-10-23 MED ORDER — PRAVASTATIN SODIUM 20 MG PO TABS: 20.0000 mg | ORAL_TABLET | Freq: Every day | ORAL | 3 refills | Status: DC

## 2021-10-23 MED ORDER — METOPROLOL TARTRATE 100 MG PO TABS: 100.0000 mg | ORAL_TABLET | Freq: Two times a day (BID) | ORAL | 3 refills | Status: DC

## 2021-10-23 MED ORDER — OMEPRAZOLE 20 MG PO CPDR: DELAYED_RELEASE_CAPSULE | ORAL | 3 refills | Status: DC

## 2021-10-23 NOTE — Patient Instructions (Addendum)
PNA shot today.  Check with your insurance to see if they will cover the shingles shot. Please ask the front for a record release about your eye exam.   Please call the GI clinic when possible.    CXR on the way out.   Take care.  Glad to see you.

## 2021-10-23 NOTE — Progress Notes (Signed)
This visit occurred during the SARS-CoV-2 public health emergency.  Safety protocols were in place, including screening questions prior to the visit, additional usage of staff PPE, and extensive cleaning of exam room while observing appropriate contact time as indicated for disinfecting solutions.  I have personally reviewed the Medicare Annual Wellness questionnaire and have noted 1. The patient's medical and social history 2. Their use of alcohol, tobacco or illicit drugs 3. Their current medications and supplements 4. The patient's functional ability including ADL's, fall risks, home safety risks and hearing or visual             impairment. 5. Diet and physical activities 6. Evidence for depression or mood disorders  The patients weight, height, BMI have been recorded in the chart and visual acuity is per eye clinic.  I have made referrals, counseling and provided education to the patient based review of the above and I have provided the pt with a written personalized care plan for preventive services.  Provider list updated- see scanned forms.  Routine anticipatory guidance given to patient.  See health maintenance. The possibility exists that previously documented standard health maintenance information may have been brought forward from a previous encounter into this note.  If needed, that same information has been updated to reflect the current situation based on today's encounter.    Flu 2022 Shingles discussed with patient PNA 2022 Tetanus 2013 COVID-vaccine previously done Colonoscopy 2020, she will call about follow-up.  Discussed. Breast cancer screening 2022 Bone density test 2022 Advance directive-daughter Estill Bamberg designated if patient were incapacitated. Cognitive function addressed- see scanned forms- and if abnormal then additional documentation follows.   In addition to [facility] Wellness, follow up visit for the below conditions:  No diabetic by A1c.  Discussed with  patient.  Hypertension:    Using medication without problems or lightheadedness: yes Chest pain with exertion:no Edema:no Short of breath: only with sig exertion and this is stable.   Labs d/w pt.   Intentional weight loss.  We talked about trying to get on a regular meal routine.   Elevated Cholesterol: Using medications without problems:yes Muscle aches: no Diet compliance: d/w pt.   Exercise: d/w pt.    Skin changes on the upper chest wall resolved.    We talked about getting f/u CXR, ordered prior.    She has tapered PPI to every 3rd day.  She failed total cessation.  Controlled as is.    PMH and SH reviewed  Meds, vitals, and allergies reviewed.   ROS: Per HPI.  Unless specifically indicated otherwise in HPI, the patient denies:  General: fever. Eyes: acute vision changes ENT: sore throat Cardiovascular: chest pain Respiratory: SOB GI: vomiting GU: dysuria Musculoskeletal: acute back pain Derm: acute rash Neuro: acute motor dysfunction Psych: worsening mood Endocrine: polydipsia Heme: bleeding Allergy: hayfever  GEN: nad, alert and oriented HEENT: ncat NECK: supple w/o LA CV: rrr. PULM: ctab, no inc wob ABD: soft, +bs EXT: no edema SKIN: no acute rash

## 2021-10-27 DIAGNOSIS — Z0000 Encounter for general adult medical examination without abnormal findings: Secondary | ICD-10-CM | POA: Insufficient documentation

## 2021-10-27 NOTE — Assessment & Plan Note (Signed)
Continue taking PPI episodically.  She failed total cessation.  See above.

## 2021-10-27 NOTE — Assessment & Plan Note (Signed)
Flu 2022 Shingles discussed with patient PNA 2022 Tetanus 2013 COVID-vaccine previously done Colonoscopy 2020, she will call about follow-up.  Discussed. Breast cancer screening 2022 Bone density test 2022 Advance directive-daughter Estill Bamberg designated if patient were incapacitated. Cognitive function addressed- see scanned forms- and if abnormal then additional documentation follows.

## 2021-10-27 NOTE — Assessment & Plan Note (Signed)
Continue pravastatin.  Continue work on diet and exercise. 

## 2021-10-27 NOTE — Assessment & Plan Note (Signed)
Not diabetic based on A1c.  Continue work on diet and exercise.

## 2021-10-27 NOTE — Assessment & Plan Note (Signed)
Daughter Amanda designated in case patient were incapacitated. 

## 2021-10-27 NOTE — Assessment & Plan Note (Signed)
Continue metoprolol.  Continue work on diet and exercise. 

## 2021-10-28 ENCOUNTER — Other Ambulatory Visit: Payer: Self-pay | Admitting: Family Medicine

## 2021-10-28 DIAGNOSIS — R9389 Abnormal findings on diagnostic imaging of other specified body structures: Secondary | ICD-10-CM

## 2021-10-29 ENCOUNTER — Encounter: Payer: Self-pay | Admitting: *Deleted

## 2021-10-30 ENCOUNTER — Encounter: Payer: Self-pay | Admitting: Gastroenterology

## 2021-11-18 ENCOUNTER — Other Ambulatory Visit: Payer: Self-pay

## 2021-11-18 ENCOUNTER — Encounter: Payer: Self-pay | Admitting: Hematology and Oncology

## 2021-11-18 ENCOUNTER — Ambulatory Visit
Admission: RE | Admit: 2021-11-18 | Discharge: 2021-11-18 | Disposition: A | Discharge status: Home / Self Care | Payer: Medicare Other | Source: Ambulatory Visit | Attending: Family Medicine | Admitting: Family Medicine

## 2021-11-18 DIAGNOSIS — R9389 Abnormal findings on diagnostic imaging of other specified body structures: Secondary | ICD-10-CM | POA: Diagnosis present

## 2021-11-19 ENCOUNTER — Ambulatory Visit (AMBULATORY_SURGERY_CENTER): Payer: Medicare Other | Admitting: *Deleted

## 2021-11-19 VITALS — Ht 65.0 in | Wt 285.0 lb

## 2021-11-19 DIAGNOSIS — Z800 Family history of malignant neoplasm of digestive organs: Secondary | ICD-10-CM

## 2021-11-19 DIAGNOSIS — Z86010 Personal history of colonic polyps: Secondary | ICD-10-CM

## 2021-11-19 MED ORDER — PEG-3350/ELECTROLYTES 236 G PO SOLR: 236.0000 g | Freq: Once | ORAL | 0 refills | Status: AC

## 2021-11-19 NOTE — Progress Notes (Signed)
Virtual pre visit completed over telephone.  Instructions through [place].     No egg or soy allergy known to patient  No issues known to pt with past sedation with any surgeries or procedures Patient denies ever being told they had issues or difficulty with intubation  No FH of Malignant Hyperthermia Pt is not on diet pills Pt is not on  home 02  Pt is not on blood thinners   Pt has  issues with constipation , will take daily stool softener week prior which manages her constipation   No A fib or A flutter  Pt is fully vaccinated  for Covid   Discussed with pt there will be an out-of-pocket cost for prep and that varies from $0 to 70 +  dollars - pt verbalized understanding   Due to the COVID-19 pandemic we are asking patients to follow certain guidelines in PV and the [facility]   Pt aware of COVID protocols and [facility] guidelines   PV completed over the phone. Pt verified name, DOB, address and insurance during PV today.  Pt mailed instruction packet with copy of consent form to read and not return, and instructions.  Pt encouraged to call with questions or issues.  If pt has My chart, procedure instructions sent via My Chart

## 2021-11-26 ENCOUNTER — Ambulatory Visit (AMBULATORY_SURGERY_CENTER): Payer: Medicare Other | Admitting: Gastroenterology

## 2021-11-26 ENCOUNTER — Encounter: Payer: Self-pay | Admitting: Gastroenterology

## 2021-11-26 ENCOUNTER — Other Ambulatory Visit: Payer: Self-pay

## 2021-11-26 VITALS — BP 147/67 | HR 58 | Temp 95.9°F | Resp 14 | Ht 65.0 in | Wt 285.0 lb

## 2021-11-26 DIAGNOSIS — D122 Benign neoplasm of ascending colon: Secondary | ICD-10-CM | POA: Diagnosis not present

## 2021-11-26 DIAGNOSIS — D124 Benign neoplasm of descending colon: Secondary | ICD-10-CM

## 2021-11-26 DIAGNOSIS — D120 Benign neoplasm of cecum: Secondary | ICD-10-CM | POA: Diagnosis not present

## 2021-11-26 DIAGNOSIS — Z86010 Personal history of colonic polyps: Secondary | ICD-10-CM

## 2021-11-26 DIAGNOSIS — D123 Benign neoplasm of transverse colon: Secondary | ICD-10-CM

## 2021-11-26 DIAGNOSIS — Z800 Family history of malignant neoplasm of digestive organs: Secondary | ICD-10-CM

## 2021-11-26 DIAGNOSIS — Z8371 Family history of colonic polyps: Secondary | ICD-10-CM

## 2021-11-26 MED ORDER — SODIUM CHLORIDE 0.9 % IV SOLN: 500.0000 mL | Freq: Once | INTRAVENOUS | Status: DC

## 2021-11-26 NOTE — Op Note (Signed)
[facility] Patient Name: Tina Delgado Procedure Date: 11/26/2021 2:08 PM MRN: [id number] Endoscopist: Ladene Artist , MD Age: 66 Referring MD:  Date of Birth: Mar 13, 1956 Gender: Female Account #: [id number] Procedure:                Colonoscopy Indications:              Surveillance: Personal history of adenomatous                            polyps on last colonoscopy 3 years ago, Family                            history of colon polyps. Medicines:                Monitored Anesthesia Care Procedure:                Pre-Anesthesia Assessment:                           - Prior to the procedure, a History and Physical                            was performed, and patient medications and                            allergies were reviewed. The patient's tolerance of                            previous anesthesia was also reviewed. The risks                            and benefits of the procedure and the sedation                            options and risks were discussed with the patient.                            All questions were answered, and informed consent                            was obtained. Prior Anticoagulants: The patient has                            taken no previous anticoagulant or antiplatelet                            agents. ASA Grade Assessment: III - A patient with                            severe systemic disease. After reviewing the risks                            and benefits, the patient was deemed in  satisfactory condition to undergo the procedure.                           After obtaining informed consent, the colonoscope                            was passed under direct vision. Throughout the                            procedure, the patient's blood pressure, pulse, and                            oxygen saturations were monitored continuously. The                            Olympus CF-HQ190L [id number])  Colonoscope was                            introduced through the anus and advanced to the the                            cecum, identified by appendiceal orifice and                            ileocecal valve. The ileocecal valve, appendiceal                            orifice, and rectum were photographed. The quality                            of the bowel preparation was adequate after                            extensive lavage, suction. The colonoscopy was                            performed without difficulty. The patient tolerated                            the procedure well. Scope In: 2:14:41 PM Scope Out: 2:32:01 PM Scope Withdrawal Time: 0 hours 14 minutes 19 seconds  Total Procedure Duration: 0 hours 17 minutes 20 seconds  Findings:                 Skin tags were found on perianal exam.                           A 3 mm polyp was found in the cecum. The polyp was                            sessile. The polyp was removed with a cold biopsy                            forceps. Resection and retrieval were complete.  Four sessile polyps were found in the descending                            colon, transverse colon and ascending colon. The                            polyps were 6 to 8 mm in size. These polyps were                            removed with a cold snare. Resection and retrieval                            were complete.                           Internal hemorrhoids were found during                            retroflexion. The hemorrhoids were small and Grade                            I (internal hemorrhoids that do not prolapse).                           The exam was otherwise without abnormality on                            direct and retroflexion views. Complications:            No immediate complications. Estimated blood loss:                            None. Estimated Blood Loss:     Estimated blood loss: none. Impression:                - Perianal skin tags found on perianal exam.                           - One 3 mm polyp in the cecum, removed with a cold                            biopsy forceps. Resected and retrieved.                           - Four 6 to 8 mm polyps in the descending colon, in                            the transverse colon and in the ascending colon,                            removed with a cold snare. Resected and retrieved.                           - Internal hemorrhoids.                           -  The examination was otherwise normal on direct                            and retroflexion views. Recommendation:           - Repeat colonoscopy, likely 3 years, after studies                            are complete for surveillance based on pathology                            results with a more extensive bowel prep.                           - Patient has a contact number available for                            emergencies. The signs and symptoms of potential                            delayed complications were discussed with the                            patient. Return to normal activities tomorrow.                            Written discharge instructions were provided to the                            patient.                           - Resume previous diet.                           - Continue present medications.                           - Await pathology results. Ladene Artist, MD 11/26/2021 2:36:54 PM This report has been signed electronically.

## 2021-11-26 NOTE — Progress Notes (Signed)
Called to room to assist during endoscopic procedure.  Patient ID and intended procedure confirmed with present staff. Received instructions for my participation in the procedure from the performing physician.  

## 2021-11-26 NOTE — Progress Notes (Signed)
History & Physical  Primary Care Physician:  Tonia Ghent, MD Primary Gastroenterologist: Lucio Edward, MD  CHIEF COMPLAINT:  Personal history of colon polyps. Family history of colon polyps   HPI: Tina Delgado is a 66 y.o. female here for colonoscopy.    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 breast ([place]) July 20, 2013   histologic grade 3 invasive mammary carcinoma, 13 mm,  2/16 nodes positive, ER-positive, PR positive, HER-2/neu not amplified.T1c,N1a   Mammographic microcalcification    Morbid obesity ([place])    Personal history of chemotherapy 2014   BREAST CA   Personal history of radiation therapy 2014   BREAST CA   Torn rotator cuff 12/2015   left    Past Surgical History:  Procedure Laterality Date   BREAST EXCISIONAL BIOPSY Right 06/2013   [facility], clear margins, LN positive   BREAST SURGERY Right 07/20/2013   Wide excision, SLN biopsy, axillary dissection.   COLONOSCOPY  [id number]   JOINT REPLACEMENT  07/30/2007   LTKR Dr Derrel Nip   KNEE ARTHROSCOPY  03/04/2007   left Dr Derrel Nip   [facility]AC[facility]  2014   s/p chemotherapy Right    right breast cancer    s/p radiation therapy Right    Whole breast radiation, right.   TOTAL KNEE ARTHROPLASTY Right 01/19/2017   Procedure: TOTAL KNEE ARTHROPLASTY;  Surgeon: Corky Mull, MD;  Location: [facility] ORS;  Service: Orthopedics;  Laterality: Right;   TOTAL SHOULDER ARTHROPLASTY Left 2019   TUBAL LIGATION  ~1986   BTL   TUBAL LIGATION      Prior to Admission medications   Medication Sig Start Date End Date Taking? Authorizing Provider  Calcium Carb-Cholecalciferol  (CALCIUM 600 + D PO) Take 1 tablet by mouth 2 (two) times daily.   Yes [provider]  docusate sodium (COLACE) 100 MG capsule Take 100 mg daily as needed by mouth for mild constipation.   Yes [provider]  Fiber Adult Gummies 2 g CHEW Chew by mouth.   Yes [provider]  metoprolol tartrate (LOPRESSOR) 100 MG tablet Take 1 tablet (100 mg total) by mouth 2 (two) times daily. 10/23/21  Yes Tonia Ghent, MD  omeprazole (PRILOSEC) 20 MG capsule TAKE 1 CAPSULE BY MOUTH as needed 10/23/21  Yes Tonia Ghent, MD  pravastatin (PRAVACHOL) 20 MG tablet Take 1 tablet (20 mg total) by mouth daily. 10/23/21  Yes Tonia Ghent, MD  Probiotic Product (PROBIOTIC-10 PO) Take by mouth.   Yes [provider]  acetaminophen (TYLENOL) 500 MG tablet Take 1,000 mg every 6 (six) hours as needed by mouth for moderate pain or headache.     [provider]  amoxicillin (AMOXIL) 500 MG capsule Take 2,000 mg by mouth See admin instructions. Take 2000 mg by mouth 1 hour prior to dental procdures    [provider]  fluticasone (FLONASE) 50 MCG/ACT nasal spray Place 1 spray into both nostrils daily as needed for allergies or rhinitis.  11/19/16   [provider]  Loratadine (CLARITIN PO) Take 1  tablet by mouth daily as needed.    [provider]    Current Outpatient Medications  Medication Sig Dispense Refill   Calcium Carb-Cholecalciferol (CALCIUM 600 + D PO) Take 1 tablet by mouth 2 (two) times daily.     docusate sodium (COLACE) 100 MG capsule Take 100 mg daily as needed by mouth for mild constipation.     Fiber Adult Gummies 2 g CHEW Chew by mouth.     metoprolol tartrate (LOPRESSOR) 100 MG tablet Take 1 tablet (100 mg total) by mouth 2 (two) times daily. 180 tablet 3   omeprazole (PRILOSEC) 20 MG capsule TAKE 1 CAPSULE BY MOUTH as needed 90 capsule 3   pravastatin (PRAVACHOL) 20 MG tablet Take 1 tablet (20 mg total) by mouth daily. 90  tablet 3   Probiotic Product (PROBIOTIC-10 PO) Take by mouth.     acetaminophen (TYLENOL) 500 MG tablet Take 1,000 mg every 6 (six) hours as needed by mouth for moderate pain or headache.      amoxicillin (AMOXIL) 500 MG capsule Take 2,000 mg by mouth See admin instructions. Take 2000 mg by mouth 1 hour prior to dental procdures     fluticasone (FLONASE) 50 MCG/ACT nasal spray Place 1 spray into both nostrils daily as needed for allergies or rhinitis.      Loratadine (CLARITIN PO) Take 1 tablet by mouth daily as needed.     Current Facility-Administered Medications  Medication Dose Route Frequency Provider Last Rate Last Admin   0.9 %  sodium chloride infusion  500 mL Intravenous Once Ladene Artist, MD        Allergies as of 11/26/2021 - Review Complete 11/26/2021  Allergen Reaction Noted   Simvastatin Other (See Comments) 08/19/2017   Latex Rash and Other (See Comments) 03/01/2014   Lisinopril Cough 03/31/2011   Sulfonamide derivatives Hives and Other (See Comments) 05/21/2009    Family History  Problem Relation Age of Onset   Heart disease Mother        MI   Depression Mother        paranoia   Hypertension Mother    Stroke Mother    Parkinsonism Mother    Colon polyps Father    Heart disease Father    Heart disease Brother        MI PTCA at 36   Diabetes Maternal Aunt    Diabetes Maternal Uncle    Diabetes Maternal Uncle    Colon cancer Paternal Uncle    Diabetes Maternal Grandfather    Stomach cancer Paternal Grandfather    Breast cancer Cousin    Esophageal cancer Neg Hx    Rectal cancer Neg Hx     Social History   Socioeconomic History   Marital status: Legally Separated    Spouse name: Not on file   Number of children: Not on file   Years of education: Not on file   Highest education level: Not on file  Occupational History   Not on file  Tobacco Use   Smoking status: Never   Smokeless tobacco: Never   Tobacco comments:    smoked in high school - not  a full pack  Vaping Use   Vaping Use: Never used  Substance and Sexual Activity   Alcohol use: Yes    Alcohol/week: 0.0 standard drinks    Comment: rare   Drug use: No   Sexual activity: Not on file  Other Topics Concern   Not on file  Social History  Narrative   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  Gen: Denies any fever, chills, sweats, anorexia, fatigue, weakness, malaise, weight loss, and sleep disorder CV: Denies chest pain, angina, palpitations, syncope, orthopnea, PND, peripheral edema, and claudication. Resp: Denies dyspnea at rest, dyspnea with exercise, cough, sputum, wheezing, coughing up blood, and pleurisy. GI: Denies vomiting blood, jaundice, and fecal incontinence.   Denies dysphagia or odynophagia. GU : Denies urinary burning, blood in urine, urinary frequency, urinary hesitancy, nocturnal urination, and urinary incontinence. MS: Denies joint pain, limitation of movement, and swelling, stiffness, low back pain, extremity pain. Denies muscle weakness, cramps, atrophy.  Derm: Denies rash, itching, dry skin, hives, moles, warts, or unhealing ulcers.  Psych: Denies depression, anxiety, memory loss, suicidal ideation, hallucinations, paranoia, and confusion. Heme: Denies bruising, bleeding, and enlarged lymph nodes. Neuro:  Denies any headaches, dizziness, paresthesias. Endo:  Denies any problems with DM, thyroid, adrenal function.   Physical Exam: General:   Alert, well-developed, in NAD Head:  Normocephalic and atraumatic. Eyes:  Sclera clear, no icterus.   Conjunctiva pink. Ears:  Normal auditory acuity. Mouth:  No deformity or lesions.  Neck:  Supple; no masses . Lungs:  Clear throughout to auscultation.   No wheezes, crackles, or rhonchi. No acute distress. Heart:  Regular rate and rhythm; no murmurs. Abdomen:  Soft, nondistended, nontender. No masses, hepatomegaly. No obvious masses.  Normal bowel .    Rectal:  Deferred   Msk:  Symmetrical without gross deformities.. Pulses:  Normal pulses noted. Extremities:  Without edema. Neurologic:  Alert and  oriented x4;  grossly normal neurologically. Skin:  Intact without significant lesions or rashes. Cervical Nodes:  No significant cervical adenopathy. Psych:  Alert and cooperative. Normal mood and affect.   Impression / Plan:   Personal history of colon polyps and family history of colon polyps.    Pricilla Riffle. Fuller Plan  11/26/2021, 2:40 PM See Shea Evans, [facility], to contact our on call provider

## 2021-11-26 NOTE — Patient Instructions (Signed)
Information on polyps and hemorrhoids given to you today.  Await pathology results.  Resume previous diet and medications.   YOU HAD AN ENDOSCOPIC PROCEDURE TODAY AT [facility]:   Refer to the procedure report that was given to you for any specific questions about what was found during the examination.  If the procedure report does not answer your questions, please call your gastroenterologist to clarify.  If you requested that your care partner not be given the details of your procedure findings, then the procedure report has been included in a sealed envelope for you to review at your convenience later.  YOU SHOULD EXPECT: Some feelings of bloating in the abdomen. Passage of more gas than usual.  Walking can help get rid of the air that was put into your GI tract during the procedure and reduce the bloating. If you had a lower endoscopy (such as a colonoscopy or flexible sigmoidoscopy) you may notice spotting of blood in your stool or on the toilet paper. If you underwent a bowel prep for your procedure, you may not have a normal bowel movement for a few days.  Please Note:  You might notice some irritation and congestion in your nose or some drainage.  This is from the oxygen used during your procedure.  There is no need for concern and it should clear up in a day or so.  SYMPTOMS TO REPORT IMMEDIATELY:  Following lower endoscopy (colonoscopy or flexible sigmoidoscopy):  Excessive amounts of blood in the stool  Significant tenderness or worsening of abdominal pains  Swelling of the abdomen that is new, acute  Fever of 100F or higher   For urgent or emergent issues, a gastroenterologist can be reached at any hour by calling [phone]. Do not use MyChart messaging for urgent concerns.    DIET:  We do recommend a small meal at first, but then you may proceed to your regular diet.  Drink plenty of fluids but you should avoid alcoholic beverages for 24  hours.  ACTIVITY:  You should plan to take it easy for the rest of today and you should NOT DRIVE or use heavy machinery until tomorrow (because of the sedation medicines used during the test).    FOLLOW UP: Our staff will call the number listed on your records 48-72 hours following your procedure to check on you and address any questions or concerns that you may have regarding the information given to you following your procedure. If we do not reach you, we will leave a message.  We will attempt to reach you two times.  During this call, we will ask if you have developed any symptoms of COVID 19. If you develop any symptoms (ie: fever, flu-like symptoms, shortness of breath, cough etc.) before then, please call [phone].  If you test positive for Covid 19 in the 2 weeks post procedure, please call and report this information to [place].    If any biopsies were taken you will be contacted by phone or by letter within the next 1-3 weeks.  Please call us at [phone] if you have not heard about the biopsies in 3 weeks.    SIGNATURES/CONFIDENTIALITY: You and/or your care partner have signed paperwork which will be entered into your electronic medical record.  These signatures attest to the fact that that the information above on your After Visit Summary has been reviewed and is understood.  Full responsibility of the confidentiality of this discharge information lies with you and/or  your care-partner.

## 2021-11-26 NOTE — Progress Notes (Signed)
VSS, transported to PACU °

## 2021-11-26 NOTE — Progress Notes (Signed)
VS  DT ? ?Pt's states no medical or surgical changes since previsit or office visit. ? ?

## 2021-11-28 ENCOUNTER — Telehealth: Payer: Self-pay

## 2021-11-28 ENCOUNTER — Telehealth: Payer: Self-pay | Admitting: *Deleted

## 2021-11-28 NOTE — Telephone Encounter (Signed)
No answer, left message to call back later today, B.[name] RN. 

## 2021-11-28 NOTE — Telephone Encounter (Signed)
°  Follow up Call-  Call back number 11/26/2021 05/26/2019  Post procedure Call Back phone  # [phone]  Permission to leave phone message Yes Yes  Some recent data might be hidden     Patient questions:  Do you have a fever, pain , or abdominal swelling? No. Pain Score  0 *  Have you tolerated food without any problems? Yes.    Have you been able to return to your normal activities? Yes.    Do you have any questions about your discharge instructions: Diet   No. Medications  No. Follow up visit  No.  Do you have questions or concerns about your Care? No.  Actions: * If pain score is 4 or above: No action needed, pain <4.

## 2021-12-10 ENCOUNTER — Encounter: Payer: Self-pay | Admitting: Gastroenterology

## 2022-02-01 ENCOUNTER — Encounter: Payer: Self-pay | Admitting: Family Medicine

## 2022-02-03 ENCOUNTER — Encounter: Payer: Self-pay | Admitting: Family

## 2022-02-03 ENCOUNTER — Ambulatory Visit (INDEPENDENT_AMBULATORY_CARE_PROVIDER_SITE_OTHER): Payer: Medicare Other | Admitting: Family

## 2022-02-03 VITALS — BP 142/82 | HR 73 | Temp 98.5°F | Resp 16 | Ht 65.0 in | Wt 280.4 lb

## 2022-02-03 DIAGNOSIS — S299XXA Unspecified injury of thorax, initial encounter: Secondary | ICD-10-CM | POA: Diagnosis not present

## 2022-02-03 DIAGNOSIS — R079 Chest pain, unspecified: Secondary | ICD-10-CM | POA: Insufficient documentation

## 2022-02-03 DIAGNOSIS — M25522 Pain in left elbow: Secondary | ICD-10-CM | POA: Insufficient documentation

## 2022-02-03 DIAGNOSIS — S59902A Unspecified injury of left elbow, initial encounter: Secondary | ICD-10-CM | POA: Insufficient documentation

## 2022-02-03 DIAGNOSIS — R0781 Pleurodynia: Secondary | ICD-10-CM | POA: Insufficient documentation

## 2022-02-03 DIAGNOSIS — L304 Erythema intertrigo: Secondary | ICD-10-CM | POA: Insufficient documentation

## 2022-02-03 MED ORDER — MICONAZOLE NITRATE 2 % EX CREA: 1.0000 "application " | TOPICAL_CREAM | Freq: Two times a day (BID) | CUTANEOUS | 0 refills | Status: AC

## 2022-02-03 NOTE — Assessment & Plan Note (Addendum)
Chest xray ordered pending results 

## 2022-02-03 NOTE — Assessment & Plan Note (Signed)
rx miconazole 2% ?

## 2022-02-03 NOTE — Assessment & Plan Note (Signed)
Left rib particularly  ?Recommended tylenol codeine, pt refuses ?Tylenol/ibuprofen prn ?Pillow for coughing/sneezing/ ?

## 2022-02-03 NOTE — Assessment & Plan Note (Signed)
Allow rest, ice/heat ?Naproxen,tylenol alternating ?Elbow compression sleeve ?Avoid overuse ?

## 2022-02-03 NOTE — Patient Instructions (Signed)
Complete xray(s) prior to leaving today. I will notify you of your results once received. ? ?Ibuprofen/tylenol as needed for pain.  ?Ice/heat ? ?Due to recent changes in healthcare laws, you may see results of your imaging and/or laboratory studies on MyChart before I have had a chance to review them.  I understand that in some cases there may be results that are confusing or concerning to you. Please understand that not all results are received at the same time and often I may need to interpret multiple results in order to provide you with the best plan of care or course of treatment. Therefore, I ask that you please give me 2 business days to thoroughly review all your results before contacting my office for clarification. Should we see a critical lab result, you will be contacted sooner.  ? ?It was a pleasure seeing you today! Please do not hesitate to reach out with any questions and or concerns. ? ?Regards,  ? ?Jaylee Freeze ?FNP-C ? ? ? ? ?

## 2022-02-03 NOTE — Assessment & Plan Note (Addendum)
Ordered Xray left elbow pending results  ? ?

## 2022-02-03 NOTE — Progress Notes (Signed)
? ?Established Patient Office Visit ? ?Subjective:  ?Patient ID: Tina Delgado, female    DOB: 02/16/1956  Age: 66 y.o. MRN: [id number] ? ?CC:  ?Chief Complaint  ?Patient presents with  ? Rib Injury  ?  Fell 1 week ago.   ? Elbow Injury  ? ? ?HPI ?Tina Delgado is here today with concerns.  ?One week ago fell by tripping over her shoe onto the concrete and when she went down, she placed her hands in front of her face falling first on her bil knees and then belly flopping landing on her left elbow and  She does have an abrasion but tenderness is on her lateral elbow, when pressing shooting pain to her lower arm. The day after falling she felt pain from elbow up, which have since improved. No neck pain. When working picking up bags off the floor to count she aggravated her left side, and her left rib and right rib was causing her some pain right under her breast. When she coughs, turns, or with breathing is aggravated. Not tender to the touch.  ? ?Otherwise no cough, only if incidentally due to allergies.  ?No blood in stool.  ? ?Past Medical History:  ?Diagnosis Date  ? Allergy   ? Arthritis   ? s/p knee injection per ortho  ? Breast cancer ([place]) 2014  ? RT LUMPECTOMY  ? Depression   ? Diabetes mellitus without complication ([place])   ? Dyspnea   ? Elevated glucose 05/2003  ? 106  ? GERD (gastroesophageal reflux disease)   ? Hyperlipidemia   ? Hypertension   ? Malignant neoplasm of upper-outer quadrant of female breast [facility]) July 20, 2013  ? histologic grade 3 invasive mammary carcinoma, 13 mm,  2/16 nodes positive, ER-positive, PR positive, HER-2/neu not amplified.T1c,N1a  ? Mammographic microcalcification   ? Morbid obesity ([place])   ? Personal history of chemotherapy 2014  ? BREAST CA  ? Personal history of radiation therapy 2014  ? BREAST CA  ? Torn rotator cuff 12/2015  ? left  ? ? ?Past Surgical History:  ?Procedure Laterality Date  ? BREAST EXCISIONAL BIOPSY Right 06/2013  ? IMC, clear margins, LN  positive  ? BREAST SURGERY Right 07/20/2013  ? Wide excision, SLN biopsy, axillary dissection.  ? COLONOSCOPY  [id number]  ? JOINT REPLACEMENT  07/30/2007  ? LTKR Dr Derrel Nip  ? KNEE ARTHROSCOPY  03/04/2007  ? left Dr Derrel Nip  ? PORTACATH PLACEMENT  2014  ? s/p chemotherapy Right   ? right breast cancer   ? s/p radiation therapy Right   ? Whole breast radiation, right.  ? TOTAL KNEE ARTHROPLASTY Right 01/19/2017  ? Procedure: TOTAL KNEE ARTHROPLASTY;  Surgeon: Corky Mull, MD;  Location: [facility] ORS;  Service: Orthopedics;  Laterality: Right;  ? TOTAL SHOULDER ARTHROPLASTY Left 2019  ? TUBAL LIGATION  ~1986  ? BTL  ? TUBAL LIGATION    ? ? ?Family History  ?Problem Relation Age of Onset  ? Heart disease Mother   ?     MI  ? Depression Mother   ?     paranoia  ? Hypertension Mother   ? Stroke Mother   ? Parkinsonism Mother   ? Colon polyps Father   ? Heart disease Father   ? Heart disease Brother   ?     MI PTCA at 39  ? Diabetes Maternal Aunt   ? Diabetes Maternal Uncle   ? Diabetes Maternal Uncle   ?  Colon cancer Paternal Uncle   ? Diabetes Maternal Grandfather   ? Stomach cancer Paternal Grandfather   ? Breast cancer Cousin   ? Esophageal cancer Neg Hx   ? Rectal cancer Neg Hx   ? ? ?Social History  ? ?Socioeconomic History  ? Marital status: Legally Separated  ?  Spouse name: Not on file  ? Number of children: Not on file  ? Years of education: Not on file  ? Highest education level: Not on file  ?Occupational History  ? Not on file  ?Tobacco Use  ? Smoking status: Never  ? Smokeless tobacco: Never  ? Tobacco comments:  ?  smoked in high school - not a full pack  ?Vaping Use  ? Vaping Use: Never used  ?Substance and Sexual Activity  ? Alcohol use: Yes  ?  Alcohol/week: 0.0 standard drinks  ?  Comment: rare  ? Drug use: No  ? Sexual activity: Not on file  ?Other Topics Concern  ? Not on file  ?Social History Narrative  ? 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 Part time work at [employer]  ? Separated as  of 2021 (had been for years)  ? 2 grown daughters (1 lives with patient)  ? Enjoys movies, yardwork  ? ?Social Determinants of Health  ? ?Financial Resource Strain: Not on file  ?Food Insecurity: Not on file  ?Transportation Needs: Not on file  ?Physical Activity: Not on file  ?Stress: Not on file  ?Social Connections: Not on file  ?Intimate Partner Violence: Not on file  ? ? ?Outpatient Medications Prior to Visit  ?Medication Sig Dispense Refill  ? acetaminophen (TYLENOL) 500 MG tablet Take 1,000 mg every 6 (six) hours as needed by mouth for moderate pain or headache.     ? amoxicillin (AMOXIL) 500 MG capsule Take 2,000 mg by mouth See admin instructions. Take 2000 mg by mouth 1 hour prior to dental procdures    ? Calcium Carb-Cholecalciferol (CALCIUM 600 + D PO) Take 1 tablet by mouth 2 (two) times daily.    ? docusate sodium (COLACE) 100 MG capsule Take 100 mg daily as needed by mouth for mild constipation.    ? Fiber Adult Gummies 2 g CHEW Chew by mouth.    ? fluticasone (FLONASE) 50 MCG/ACT nasal spray Place 1 spray into both nostrils daily as needed for allergies or rhinitis.     ? Loratadine (CLARITIN PO) Take 1 tablet by mouth daily as needed.    ? metoprolol tartrate (LOPRESSOR) 100 MG tablet Take 1 tablet (100 mg total) by mouth 2 (two) times daily. 180 tablet 3  ? omeprazole (PRILOSEC) 20 MG capsule TAKE 1 CAPSULE BY MOUTH as needed 90 capsule 3  ? pravastatin (PRAVACHOL) 20 MG tablet Take 1 tablet (20 mg total) by mouth daily. 90 tablet 3  ? Probiotic Product (PROBIOTIC-10 PO) Take by mouth.    ? ?No facility-administered medications prior to visit.  ? ? ?Allergies  ?Allergen Reactions  ? Simvastatin Other (See Comments)  ?  Myalgias.    ? Latex Rash and Other (See Comments)  ?  More adhesive allergy than latex.  Has never been RAST tested. Thinks paper tape is okay.  ? Lisinopril Cough  ? Sulfonamide Derivatives Hives and Other (See Comments)  ?  REACTION: itching years ago  ? ? ?ROS ?Review of Systems   ?Constitutional:  Negative for chills and fever.  ?HENT:  Negative for congestion, ear pain, sinus pressure  and sore throat.   ?Respiratory:  Negative for cough, shortness of breath and wheezing.   ?Cardiovascular:  Positive for chest pain (left rib pain, no brusing). Negative for palpitations.  ?Musculoskeletal:  Positive for arthralgias (left elbow pain with abrasion from fall).  ? ?  ?Objective:  ?  ?Physical Exam ?Constitutional:   ?   General: She is not in acute distress. ?   Appearance: She is normal weight. She is not ill-appearing, toxic-appearing or diaphoretic.  ?Cardiovascular:  ?   Rate and Rhythm: Normal rate and regular rhythm.  ?Pulmonary:  ?   Effort: Pulmonary effort is normal.  ?   Breath sounds: Normal breath sounds. No wheezing, rhonchi or rales.  ?Chest:  ?   Chest wall: Swelling (left upper aspect below breast bone) and tenderness present.  ? ? ?Abdominal:  ?   General: Abdomen is flat.  ?   Tenderness: There is no abdominal tenderness.  ?Skin: ?   Comments: Bil below breast erythema with papular satellite lesions   ?Neurological:  ?   Mental Status: She is alert.  ? ? ?BP (!) 142/82   Pulse 73   Temp 98.5 ?F (36.9 ?C)   Resp 16   Ht 5' 5"  (1.651 m)   Wt 280 lb 6 oz (127.2 kg)   SpO2 96%   BMI 46.66 kg/m?  ?Wt Readings from Last 3 Encounters:  ?02/03/22 280 lb 6 oz (127.2 kg)  ?11/26/21 285 lb (129.3 kg)  ?11/19/21 285 lb (129.3 kg)  ? ? ? ?Health Maintenance Due  ?Topic Date Due  ? Zoster Vaccines- Shingrix (1 of 2) Never done  ? ? ?There are no preventive care reminders to display for this patient. ? ?Lab Results  ?Component Value Date  ? TSH 3.590 03/29/2018  ? ?Lab Results  ?Component Value Date  ? WBC 8.6 09/30/2021  ? HGB 15.3 (H) 09/30/2021  ? HCT 46.3 (H) 09/30/2021  ? MCV 90.6 09/30/2021  ? PLT 235 09/30/2021  ? ?Lab Results  ?Component Value Date  ? NA 141 09/30/2021  ? K 4.6 09/30/2021  ? CO2 33 (H) 09/30/2021  ? GLUCOSE 132 (H) 09/30/2021  ? BUN 12 09/30/2021  ? CREATININE  0.92 09/30/2021  ? BILITOT 0.6 09/30/2021  ? ALKPHOS 69 09/30/2021  ? AST 26 09/30/2021  ? ALT 24 09/30/2021  ? PROT 7.2 09/30/2021  ? ALBUMIN 3.8 09/30/2021  ? CALCIUM 9.4 09/30/2021  ? ANIONGAP 10 09/30/2021

## 2022-02-03 NOTE — Assessment & Plan Note (Signed)
Left rib xray pending results ?If any new sob or doe go to er and or all 911 ?

## 2022-02-04 ENCOUNTER — Ambulatory Visit (INDEPENDENT_AMBULATORY_CARE_PROVIDER_SITE_OTHER)
Admission: RE | Admit: 2022-02-04 | Discharge: 2022-02-04 | Disposition: A | Discharge status: Home / Self Care | Payer: Medicare Other | Source: Ambulatory Visit | Attending: Family | Admitting: Family

## 2022-02-04 DIAGNOSIS — S59902A Unspecified injury of left elbow, initial encounter: Secondary | ICD-10-CM | POA: Diagnosis not present

## 2022-02-04 DIAGNOSIS — S299XXA Unspecified injury of thorax, initial encounter: Secondary | ICD-10-CM | POA: Diagnosis not present

## 2022-02-04 DIAGNOSIS — M25522 Pain in left elbow: Secondary | ICD-10-CM | POA: Diagnosis not present

## 2022-02-04 DIAGNOSIS — R079 Chest pain, unspecified: Secondary | ICD-10-CM | POA: Diagnosis not present

## 2022-02-04 DIAGNOSIS — R0781 Pleurodynia: Secondary | ICD-10-CM | POA: Diagnosis not present

## 2022-03-15 ENCOUNTER — Telehealth: Payer: Self-pay | Admitting: Family Medicine

## 2022-03-15 DIAGNOSIS — R918 Other nonspecific abnormal finding of lung field: Secondary | ICD-10-CM

## 2022-03-15 NOTE — Telephone Encounter (Signed)
Please let patient know I put in the order for the follow-up chest CT and she should get a call about scheduling that.  Thanks.

## 2022-03-16 NOTE — Telephone Encounter (Signed)
Left message to return call to our office.  

## 2022-03-20 NOTE — Telephone Encounter (Signed)
Patient is fine with doing f/u chest CT and will await call to schedule.

## 2022-03-23 NOTE — Telephone Encounter (Signed)
Noted. Thanks.

## 2022-04-08 ENCOUNTER — Encounter: Payer: Self-pay | Admitting: *Deleted

## 2022-04-15 ENCOUNTER — Other Ambulatory Visit: Payer: Self-pay | Admitting: Family Medicine

## 2022-04-16 ENCOUNTER — Ambulatory Visit
Admission: RE | Admit: 2022-04-16 | Discharge: 2022-04-16 | Disposition: A | Discharge status: Home / Self Care | Payer: Medicare Other | Source: Ambulatory Visit | Attending: Family Medicine | Admitting: Family Medicine

## 2022-04-16 DIAGNOSIS — R918 Other nonspecific abnormal finding of lung field: Secondary | ICD-10-CM

## 2022-07-20 ENCOUNTER — Ambulatory Visit
Admission: RE | Admit: 2022-07-20 | Discharge: 2022-07-20 | Disposition: A | Discharge status: Home / Self Care | Payer: Medicare Other | Source: Ambulatory Visit | Attending: Oncology | Admitting: Oncology

## 2022-07-20 DIAGNOSIS — Z1231 Encounter for screening mammogram for malignant neoplasm of breast: Secondary | ICD-10-CM | POA: Insufficient documentation

## 2022-07-21 ENCOUNTER — Other Ambulatory Visit: Payer: Self-pay | Admitting: Oncology

## 2022-07-21 DIAGNOSIS — N6489 Other specified disorders of breast: Secondary | ICD-10-CM

## 2022-07-21 DIAGNOSIS — R928 Other abnormal and inconclusive findings on diagnostic imaging of breast: Secondary | ICD-10-CM

## 2022-07-23 ENCOUNTER — Telehealth: Payer: Self-pay

## 2022-07-23 NOTE — Telephone Encounter (Signed)
-----   Message from Earlie Server, MD sent at 07/22/2022 10:52 PM EDT ----- She needs Diagnostic mammogram and possibly ultrasound of the left breast

## 2022-07-23 NOTE — Telephone Encounter (Signed)
Pt will be scheduled and contacted by [place] with appts.

## 2022-08-04 ENCOUNTER — Ambulatory Visit
Admission: RE | Admit: 2022-08-04 | Discharge: 2022-08-04 | Disposition: A | Discharge status: Home / Self Care | Payer: Medicare Other | Source: Ambulatory Visit | Attending: Oncology | Admitting: Oncology

## 2022-08-04 DIAGNOSIS — N6489 Other specified disorders of breast: Secondary | ICD-10-CM

## 2022-08-04 DIAGNOSIS — R928 Other abnormal and inconclusive findings on diagnostic imaging of breast: Secondary | ICD-10-CM | POA: Diagnosis present

## 2022-09-11 ENCOUNTER — Encounter: Payer: Self-pay | Admitting: Family Medicine

## 2022-09-11 ENCOUNTER — Ambulatory Visit (INDEPENDENT_AMBULATORY_CARE_PROVIDER_SITE_OTHER): Payer: Medicare Other | Admitting: Family Medicine

## 2022-09-11 VITALS — BP 122/80 | HR 60 | Temp 97.3°F | Ht 65.0 in | Wt 265.0 lb

## 2022-09-11 DIAGNOSIS — R300 Dysuria: Secondary | ICD-10-CM | POA: Diagnosis not present

## 2022-09-11 DIAGNOSIS — R202 Paresthesia of skin: Secondary | ICD-10-CM | POA: Diagnosis not present

## 2022-09-11 DIAGNOSIS — Z23 Encounter for immunization: Secondary | ICD-10-CM | POA: Diagnosis not present

## 2022-09-11 LAB — POC URINALSYSI DIPSTICK (AUTOMATED)
Bilirubin, UA: NEGATIVE
Glucose, UA: NEGATIVE
Ketones, UA: NEGATIVE
Nitrite, UA: NEGATIVE
Protein, UA: POSITIVE — AB
Spec Grav, UA: 1.015 (ref 1.010–1.025)
Urobilinogen, UA: 0.2 E.U./dL
pH, UA: 6.5 (ref 5.0–8.0)

## 2022-09-11 MED ORDER — FLUTICASONE PROPIONATE 50 MCG/ACT NA SUSP: 1.0000 | Freq: Every day | NASAL | 3 refills | Status: AC | PRN

## 2022-09-11 MED ORDER — CEPHALEXIN 500 MG PO CAPS: 500.0000 mg | ORAL_CAPSULE | Freq: Three times a day (TID) | ORAL | 0 refills | Status: DC

## 2022-09-11 NOTE — Progress Notes (Unsigned)
dysuria: yes, urgency and less UOP per void, pressure to void.   duration of symptoms: one day last week, then returned this AM abdominal pain:no fevers:no back pain: at baseline.   vomiting: no  B hand tingling. No tingling in the feet unless she is in a specific compressive position, ie with sitting.    Left elbow, sx. Prev felt something in the area, with pain down the L forearm, ulnar side.  No sx today.    Meds, vitals, and allergies reviewed.   Per HPI unless specifically indicated in ROS section   GEN: nad, alert and oriented HEENT: ncat NECK: supple CV: rrr.  PULM: ctab, no inc wob ABD: soft, +bs, suprapubic area not tender EXT: no edema SKIN: no acute rash  Normal S/S B hands. Normal monofilament and vibration on the hands.  CN 2-12 wnl B, S/S/DTR wnl B Tinel neg at the wrists.   Normal elbow range of motion bilaterally.  No elbow pain on palpation.

## 2022-09-11 NOTE — Patient Instructions (Addendum)
Try a carpal tunnel brace on either or both hands.  Okay to use at night.  Likely had left ulnar nerve irritation.  Drink plenty of water and start the antibiotics today.  We'll contact you with your lab report.  Take care.

## 2022-09-12 LAB — URINE CULTURE
MICRO NUMBER:: 14205289
SPECIMEN QUALITY:: ADEQUATE

## 2022-09-13 DIAGNOSIS — R300 Dysuria: Secondary | ICD-10-CM | POA: Insufficient documentation

## 2022-09-13 DIAGNOSIS — R202 Paresthesia of skin: Secondary | ICD-10-CM | POA: Insufficient documentation

## 2022-09-13 NOTE — Assessment & Plan Note (Signed)
Presumed cystitis.  Start Keflex.  Drink plenty fluids.  See notes on urine culture.  Okay for outpatient follow-up.  She agrees to plan.

## 2022-09-13 NOTE — Assessment & Plan Note (Signed)
No weakness.  Likely carpal tunnel symptoms bilaterally, likely mild, also likely with previous left ulnar nerve irritation that has improved in the meantime. Discussed that she can try a carpal tunnel brace on either or both hands.  Okay to use at night.  She can use a soft pad on her elbow if needed.  She will update me as needed.

## 2022-10-01 ENCOUNTER — Inpatient Hospital Stay (HOSPITAL_BASED_OUTPATIENT_CLINIC_OR_DEPARTMENT_OTHER): Payer: Medicare Other | Admitting: Oncology

## 2022-10-01 ENCOUNTER — Encounter: Payer: Self-pay | Admitting: Oncology

## 2022-10-01 ENCOUNTER — Inpatient Hospital Stay: Payer: Medicare Other | Attending: Oncology

## 2022-10-01 VITALS — BP 166/84 | HR 78 | Temp 96.0°F | Wt 267.5 lb

## 2022-10-01 DIAGNOSIS — E559 Vitamin D deficiency, unspecified: Secondary | ICD-10-CM | POA: Diagnosis not present

## 2022-10-01 DIAGNOSIS — C50311 Malignant neoplasm of lower-inner quadrant of right female breast: Secondary | ICD-10-CM

## 2022-10-01 DIAGNOSIS — M8589 Other specified disorders of bone density and structure, multiple sites: Secondary | ICD-10-CM | POA: Diagnosis not present

## 2022-10-01 DIAGNOSIS — R7989 Other specified abnormal findings of blood chemistry: Secondary | ICD-10-CM

## 2022-10-01 DIAGNOSIS — Z1231 Encounter for screening mammogram for malignant neoplasm of breast: Secondary | ICD-10-CM | POA: Diagnosis not present

## 2022-10-01 DIAGNOSIS — Z9221 Personal history of antineoplastic chemotherapy: Secondary | ICD-10-CM | POA: Diagnosis not present

## 2022-10-01 DIAGNOSIS — M85859 Other specified disorders of bone density and structure, unspecified thigh: Secondary | ICD-10-CM | POA: Diagnosis not present

## 2022-10-01 DIAGNOSIS — D751 Secondary polycythemia: Secondary | ICD-10-CM | POA: Insufficient documentation

## 2022-10-01 DIAGNOSIS — Z170 Estrogen receptor positive status [ER+]: Secondary | ICD-10-CM | POA: Diagnosis not present

## 2022-10-01 DIAGNOSIS — Z853 Personal history of malignant neoplasm of breast: Secondary | ICD-10-CM | POA: Insufficient documentation

## 2022-10-01 DIAGNOSIS — Z79811 Long term (current) use of aromatase inhibitors: Secondary | ICD-10-CM | POA: Diagnosis not present

## 2022-10-01 DIAGNOSIS — Z923 Personal history of irradiation: Secondary | ICD-10-CM | POA: Diagnosis not present

## 2022-10-01 LAB — CBC WITH DIFFERENTIAL/PLATELET
Abs Immature Granulocytes: 0.04 10*3/uL (ref 0.00–0.07)
Basophils Absolute: 0 10*3/uL (ref 0.0–0.1)
Basophils Relative: 1 %
Eosinophils Absolute: 0.2 10*3/uL (ref 0.0–0.5)
Eosinophils Relative: 2 %
HCT: 45.6 % (ref 36.0–46.0)
Hemoglobin: 15.4 g/dL — ABNORMAL HIGH (ref 12.0–15.0)
Immature Granulocytes: 1 %
Lymphocytes Relative: 25 %
Lymphs Abs: 1.9 10*3/uL (ref 0.7–4.0)
MCH: 30.1 pg (ref 26.0–34.0)
MCHC: 33.8 g/dL (ref 30.0–36.0)
MCV: 89.1 fL (ref 80.0–100.0)
Monocytes Absolute: 0.5 10*3/uL (ref 0.1–1.0)
Monocytes Relative: 7 %
Neutro Abs: 5 10*3/uL (ref 1.7–7.7)
Neutrophils Relative %: 64 %
Platelets: 217 10*3/uL (ref 150–400)
RBC: 5.12 MIL/uL — ABNORMAL HIGH (ref 3.87–5.11)
RDW: 12.5 % (ref 11.5–15.5)
WBC: 7.6 10*3/uL (ref 4.0–10.5)
nRBC: 0 % (ref 0.0–0.2)

## 2022-10-01 LAB — COMPREHENSIVE METABOLIC PANEL
ALT: 19 U/L (ref 0–44)
AST: 20 U/L (ref 15–41)
Albumin: 3.7 g/dL (ref 3.5–5.0)
Alkaline Phosphatase: 70 U/L (ref 38–126)
Anion gap: 9 (ref 5–15)
BUN: 15 mg/dL (ref 8–23)
CO2: 27 mmol/L (ref 22–32)
Calcium: 8.7 mg/dL — ABNORMAL LOW (ref 8.9–10.3)
Chloride: 103 mmol/L (ref 98–111)
Creatinine, Ser: 0.86 mg/dL (ref 0.44–1.00)
GFR, Estimated: >60 mL/min (ref >60)
Glucose, Bld: 124 mg/dL — ABNORMAL HIGH (ref 70–99)
Potassium: 3.8 mmol/L (ref 3.5–5.1)
Sodium: 139 mmol/L (ref 135–145)
Total Bilirubin: 0.7 mg/dL (ref 0.3–1.2)
Total Protein: 7.2 g/dL (ref 6.5–8.1)

## 2022-10-01 LAB — VITAMIN D 25 HYDROXY (VIT D DEFICIENCY, FRACTURES): Vit D, 25-Hydroxy: 49.03 ng/mL (ref 30–100)

## 2022-10-01 NOTE — Assessment & Plan Note (Signed)
Recommended patient to continue oral calcium and vitamin D supplementation. Patient declined bisphosphonate treatments

## 2022-10-01 NOTE — Assessment & Plan Note (Addendum)
#  History of stage IIB right breast cancer s/p lumpectomy  #History of stage IIb right breast cancer, ER/PR positive, HER2 negative S/p lumpectomy and sentinel lymph node biopsy [05/2013], adjuvant chemotherapy and adjuvant radiation.   Finished 5 years of adjuvant endocrine therapy with Arimidex in November 2020. BCI testing on 10/07/2018 which revealed a 13.1% risk of late recurrence during year 5. 10.  Low likelihood of benefit of extended endocrine therapy  # Left breast asymmetry, likely benign.  Repeat diagnostic unilateral breast mammogram/ultrasound in 6 months.-April 2024 Recommend patient to continue annual screening mammogram-September 2024

## 2022-10-01 NOTE — Assessment & Plan Note (Signed)
continue oral calcium and vitamin D supplementation.

## 2022-10-01 NOTE — Progress Notes (Signed)
Hematology oncology progress note   ASSESSMENT & PLAN:   Breast cancer of lower-inner quadrant of right female breast [facility], [place]) #History of stage IIB right breast cancer s/p lumpectomy  #History of stage IIb right breast cancer, ER/PR positive, HER2 negative S/p lumpectomy and sentinel lymph node biopsy [05/2013], adjuvant chemotherapy and adjuvant radiation.   Finished 5 years of adjuvant endocrine therapy with Arimidex November 2020. Patient had BCI testing on 10/07/2018 which revealed a 13.1% risk of late recurrence during year 5. 10.  Low likelihood of benefit of extended endocrine therapy Recommend patient to continue annual screening mammogram.  Osteopenia of neck of femur Recommended patient to continue oral calcium and vitamin D supplementation. Patient declined bisphosphonate treatments  Orders Placed This Encounter  Procedures   MM 3D SCREEN BREAST BILATERAL    Standing Status:   Future    Standing Expiration Date:   10/02/2023    Order Specific Question:   Reason for Exam (SYMPTOM  OR DIAGNOSIS REQUIRED)    Answer:   Breast Cancer    Order Specific Question:   Preferred imaging location?    Answer:   [facility]   US BREAST LTD UNI LEFT INC AXILLA    Standing Status:   Future    Standing Expiration Date:   10/02/2023    Order Specific Question:   Reason for Exam (SYMPTOM  OR DIAGNOSIS REQUIRED)    Answer:   Breast cancer    Order Specific Question:   Preferred imaging location?    Answer:   [facility]   MM DIAG BREAST TOMO UNI LEFT    Standing Status:   Future    Standing Expiration Date:   10/02/2023    Order Specific Question:   Reason for Exam (SYMPTOM  OR DIAGNOSIS REQUIRED)    Answer:   Breast cancer    Order Specific Question:   Preferred imaging location?    Answer:   [facility]   CBC with Differential/Platelet    Standing Status:   Future    Number of Occurrences:   1    Standing Expiration Date:   10/02/2023   Comprehensive metabolic  panel    Standing Status:   Future    Number of Occurrences:   1    Standing Expiration Date:   10/01/2023   Cancer antigen 27.29    Standing Status:   Future    Number of Occurrences:   1    Standing Expiration Date:   10/02/2023   VITAMIN D 25 Hydroxy (Vit-D Deficiency, Fractures)    Standing Status:   Future    Number of Occurrences:   1    Standing Expiration Date:   10/02/2023   CBC with Differential/Platelet    Standing Status:   Future    Standing Expiration Date:   10/01/2023   Comprehensive metabolic panel    Standing Status:   Future    Standing Expiration Date:   10/01/2023   Cancer antigen 27.29    Standing Status:   Future    Standing Expiration Date:   10/02/2023   VITAMIN D 25 Hydroxy (Vit-D Deficiency, Fractures)    Standing Status:   Future    Standing Expiration Date:   10/02/2023   Follow-up in a year All questions were answered. The patient knows to call the clinic with any problems, questions or concerns.  Earlie Server, MD, PhD [facility] Hematology Oncology 10/01/2022    Chief Complaint: Tina Delgado is a 66 y.o. female  with a history of stage IIB left breast cancer and osteopenia who is seen for 1 year assessment.  PERTINENT ONCOLOGY HISTORY Tina Delgado is a 66 y.o.afemale who has above oncology history reviewed by me today presented for follow up visit for history of stage IIB left breast cancer and osteopenia Patient previously followed up by Dr.Corcoran, patient switched care to me on 09/30/21 Extensive medical record review was performed by me  #History of stage IIB right breast cancer s/p lumpectomy on 05/2013.  Pathology revealed a grade III 1.3 cm lesion with 2 of 16 lymph nodes positive.  Pathologic stage was T1cN1M0 lesion.  Tumor was ER+/PR+ and Her2/neu- . Patient was enrolled on NSABP B 47 clinical trial.  Patient had adjuvant chemotherapy with TC x4. She also received adjuvant radiation. Started on letrozole in 2015 for about a month,  switched to Arimidex due to dysgeusia (metallic taste) Patient completed 5 years of Arimidex November 2020. Patient had BCI testing on 10/07/2018 which revealed a 13.1% risk of late recurrence during year 5. 10.  Low likelihood of benefit of extended endocrine therapy.  Patient has annual mammogram done for surveillance.  #Osteopenia 11/15/2014 bone density study showed osteopenia  03/17/2017 reviewed osteopenia  04/03/2019 bone density revealed osteopenia  04/09/2021, bone density reviewed osteopenia  10-year probability of fracture 12.4%. Patient opted not to proceed with bone strengthening agents due to chronic dental problems.  Colonoscopy on 05/26/2019 revealed five 8 to 12 mm polyps in the transverse colon. There were three 6 to 7 mm polyps in the transverse colon, in the ascending colon and in the cecum. There was one 4 mm polyp in the ascending colon.  Pathology revealed tubular adenoma(s) without high-grade dysplasia or malignancy.  Repeat colonoscopy is planned in 2022   Today patient reports feeling well.  Patient takes Arimidex 1 mg daily.  Manageable side effects.  She denies any new breast concerns. 07/21/2022, screening bilateral mammogram showed left breast possible asymmetry warrants further evaluation.  Right breast mammogram is suspicious for malignancy. 08/04/2022 unilateral diagnostic mammogram showed left breast probably benign masses, short-term imaging follow-up is recommended.   Past Medical History:  Diagnosis Date   Allergy    Arthritis    s/p knee injection per ortho   Breast cancer ([place]) 2014   RT LUMPECTOMY   Depression    Diabetes mellitus without complication ([place])    Dyspnea    Elevated glucose 05/2003   106   GERD (gastroesophageal reflux disease)    Hyperlipidemia    Hypertension    Malignant neoplasm of upper-outer quadrant of female breast ([place]) July 20, 2013   histologic grade 3 invasive mammary carcinoma, 13 mm,  2/16 nodes positive,  ER-positive, PR positive, HER-2/neu not amplified.T1c,N1a   Mammographic microcalcification    Morbid obesity ([place])    Personal history of chemotherapy 2014   BREAST CA   Personal history of radiation therapy 2014   BREAST CA   Torn rotator cuff 12/2015   left    Past Surgical History:  Procedure Laterality Date   BREAST EXCISIONAL BIOPSY Right 06/2013   [facility], clear margins, LN positive   BREAST SURGERY Right 07/20/2013   Wide excision, SLN biopsy, axillary dissection.   COLONOSCOPY  [id number]   JOINT REPLACEMENT  07/30/2007   LTKR Dr Derrel Nip   KNEE ARTHROSCOPY  03/04/2007   left Dr Derrel Nip   [facility] PLACEMENT  2014   s/p chemotherapy Right    right breast cancer    s/p  radiation therapy Right    Whole breast radiation, right.   TOTAL KNEE ARTHROPLASTY Right 01/19/2017   Procedure: TOTAL KNEE ARTHROPLASTY;  Surgeon: Corky Mull, MD;  Location: [facility] ORS;  Service: Orthopedics;  Laterality: Right;   TOTAL SHOULDER ARTHROPLASTY Left 2019   TUBAL LIGATION  ~1986   BTL   TUBAL LIGATION      Family History  Problem Relation Age of Onset   Heart disease Mother        MI   Depression Mother        paranoia   Hypertension Mother    Stroke Mother    Parkinsonism Mother    Colon polyps Father    Heart disease Father    Heart disease Brother        MI PTCA at 64   Diabetes Maternal Aunt    Diabetes Maternal Uncle    Diabetes Maternal Uncle    Colon cancer Paternal Uncle    Diabetes Maternal Grandfather    Stomach cancer Paternal Grandfather    Breast cancer Cousin    Esophageal cancer Neg Hx    Rectal cancer Neg Hx     Social History:  reports that she has never smoked. She has never used smokeless tobacco. She reports current alcohol use. She reports that she does not use drugs.  Allergies:  Allergies  Allergen Reactions   Simvastatin Other (See Comments)    Myalgias.     Latex Rash and Other (See Comments)    More adhesive allergy than latex.  Has never been RAST  tested. Thinks paper tape is okay.   Lisinopril Cough   Sulfonamide Derivatives Hives and Other (See Comments)    REACTION: itching years ago    Current Medications: Current Outpatient Medications  Medication Sig Dispense Refill   acetaminophen (TYLENOL) 500 MG tablet Take 1,000 mg every 6 (six) hours as needed by mouth for moderate pain or headache.      Calcium Carb-Cholecalciferol (CALCIUM 600 + D PO) Take 1 tablet by mouth 2 (two) times daily.     cetirizine-pseudoephedrine (ZYRTEC-D) 5-120 MG tablet Take 1 tablet by mouth 2 (two) times daily.     docusate sodium (COLACE) 100 MG capsule Take 100 mg daily as needed by mouth for mild constipation.     fluticasone (FLONASE) 50 MCG/ACT nasal spray Place 1 spray into both nostrils daily as needed for allergies or rhinitis. 48 g 3   metoprolol tartrate (LOPRESSOR) 100 MG tablet Take 1 tablet (100 mg total) by mouth 2 (two) times daily. 180 tablet 3   omeprazole (PRILOSEC) 20 MG capsule TAKE 1 CAPSULE BY MOUTH as needed 90 capsule 3   pravastatin (PRAVACHOL) 20 MG tablet TAKE 1 TABLET BY MOUTH EVERY DAY 90 tablet 1   amoxicillin (AMOXIL) 500 MG capsule Take 2,000 mg by mouth See admin instructions. Take 2000 mg by mouth 1 hour prior to dental procdures (Patient not taking: Reported on 09/11/2022)     No current facility-administered medications for this visit.   Review of Systems  Constitutional:  Negative for appetite change, chills, fatigue and fever.  HENT:   Negative for hearing loss and voice change.   Eyes:  Negative for eye problems.  Respiratory:  Negative for chest tightness and cough.   Cardiovascular:  Negative for chest pain.  Gastrointestinal:  Negative for abdominal distention, abdominal pain and blood in stool.  Endocrine: Negative for hot flashes.  Genitourinary:  Negative for difficulty urinating and frequency.  Musculoskeletal:  Negative for arthralgias.  Skin:  Negative for itching and rash.  Neurological:  Negative  for extremity weakness.  Hematological:  Negative for adenopathy.  Psychiatric/Behavioral:  Negative for confusion.      Performance status (ECOG): 0  Physical Exam Vitals and nursing note reviewed.  Constitutional:      General: She is not in acute distress.    Appearance: She is well-developed. She is obese. She is not diaphoretic.  HENT:     Head: Normocephalic and atraumatic.     Mouth/Throat:     Mouth: Mucous membranes are moist.     Pharynx: Oropharynx is clear.  Eyes:     General: No scleral icterus.    Pupils: Pupils are equal, round, and reactive to light.  Cardiovascular:     Rate and Rhythm: Normal rate and regular rhythm.     Heart sounds: Normal heart sounds. No murmur heard. Pulmonary:     Effort: Pulmonary effort is normal. No respiratory distress.     Breath sounds: Normal breath sounds. No wheezing or rales.  Chest:     Chest wall: No tenderness.  Breasts:    Right: Skin change (scarring at 3 o clock, stable) present. No swelling, bleeding, mass, nipple discharge or tenderness.     Left: No swelling, bleeding, mass, nipple discharge, skin change or tenderness.  Abdominal:     General: Bowel sounds are normal. There is no distension.     Palpations: Abdomen is soft. There is no mass.     Tenderness: There is no abdominal tenderness. There is no guarding or rebound.  Musculoskeletal:        General: No swelling or tenderness. Normal range of motion.     Cervical back: Normal range of motion and neck supple.  Lymphadenopathy:     Head:     Right side of head: No preauricular, posterior auricular or occipital adenopathy.     Left side of head: No preauricular, posterior auricular or occipital adenopathy.     Cervical: No cervical adenopathy.     Upper Body:     Right upper body: No supraclavicular or axillary adenopathy.     Left upper body: No supraclavicular or axillary adenopathy.     Lower Body: No right inguinal adenopathy. No left inguinal adenopathy.   Skin:    General: Skin is warm and dry.  Neurological:     Mental Status: She is alert and oriented to person, place, and time.  Psychiatric:        Mood and Affect: Mood normal.    Lab    Latest Ref Rng & Units 10/01/2022    9:36 AM 09/30/2021    9:46 AM 09/26/2020    9:40 AM  CBC  WBC 4.0 - 10.5 K/uL 7.6  8.6  9.0   Hemoglobin 12.0 - 15.0 g/dL 15.4  15.3  14.9   Hematocrit 36.0 - 46.0 % 45.6  46.3  45.6   Platelets 150 - 400 K/uL 217  235  260       Latest Ref Rng & Units 10/01/2022    9:36 AM 09/30/2021    9:46 AM 08/07/2021    8:24 AM  CMP  Glucose 70 - 99 mg/dL 124  132    BUN 8 - 23 mg/dL 15  12    Creatinine 0.44 - 1.00 mg/dL 0.86  0.92  0.80   Sodium 135 - 145 mmol/L 139  141    Potassium 3.5 - 5.1 mmol/L  3.8  4.6    Chloride 98 - 111 mmol/L 103  98    CO2 22 - 32 mmol/L 27  33    Calcium 8.9 - 10.3 mg/dL 8.7  9.4    Total Protein 6.5 - 8.1 g/dL 7.2  7.2    Total Bilirubin 0.3 - 1.2 mg/dL 0.7  0.6    Alkaline Phos 38 - 126 U/L 70  69    AST 15 - 41 U/L 20  26    ALT 0 - 44 U/L 19  24

## 2022-10-01 NOTE — Assessment & Plan Note (Signed)
Persistent mild erythrocytosis.  No need for phlebotomy. Patient has risk factors for sleep apnea.  Recommend patient to further discuss with primary care provider and have sleep study done for workup.

## 2022-10-02 LAB — CANCER ANTIGEN 27.29: CA 27.29: <9 U/mL (ref 0.0–38.6)

## 2022-10-11 ENCOUNTER — Other Ambulatory Visit: Payer: Self-pay | Admitting: Family Medicine

## 2022-10-11 ENCOUNTER — Encounter: Payer: Self-pay | Admitting: Family Medicine

## 2022-10-11 DIAGNOSIS — R739 Hyperglycemia, unspecified: Secondary | ICD-10-CM

## 2022-10-11 DIAGNOSIS — E785 Hyperlipidemia, unspecified: Secondary | ICD-10-CM

## 2022-10-12 NOTE — Addendum Note (Signed)
Addended by: Francella Solian on: 10/12/2022 10:20 AM   Modules accepted: Orders

## 2022-10-20 ENCOUNTER — Other Ambulatory Visit (INDEPENDENT_AMBULATORY_CARE_PROVIDER_SITE_OTHER): Payer: Medicare Other

## 2022-10-20 DIAGNOSIS — R739 Hyperglycemia, unspecified: Secondary | ICD-10-CM

## 2022-10-20 DIAGNOSIS — E785 Hyperlipidemia, unspecified: Secondary | ICD-10-CM | POA: Diagnosis not present

## 2022-10-20 LAB — LIPID PANEL
Cholesterol: 195 mg/dL (ref 0–200)
HDL: 40.6 mg/dL (ref >39.00)
LDL Cholesterol: 132 mg/dL — ABNORMAL HIGH (ref 0–99)
NonHDL: 154.8
Total CHOL/HDL Ratio: 5
Triglycerides: 112 mg/dL (ref 0.0–149.0)
VLDL: 22.4 mg/dL (ref 0.0–40.0)

## 2022-10-20 LAB — HEMOGLOBIN A1C: Hgb A1c MFr Bld: 6.2 % (ref 4.6–6.5)

## 2022-10-27 ENCOUNTER — Encounter: Payer: Self-pay | Admitting: Family Medicine

## 2022-10-27 ENCOUNTER — Ambulatory Visit (INDEPENDENT_AMBULATORY_CARE_PROVIDER_SITE_OTHER): Payer: Medicare Other | Admitting: Family Medicine

## 2022-10-27 ENCOUNTER — Ambulatory Visit (INDEPENDENT_AMBULATORY_CARE_PROVIDER_SITE_OTHER): Payer: Medicare Other

## 2022-10-27 VITALS — Ht 65.0 in | Wt 267.0 lb

## 2022-10-27 VITALS — BP 120/84 | HR 66 | Temp 97.2°F | Ht 65.0 in | Wt 267.0 lb

## 2022-10-27 DIAGNOSIS — E785 Hyperlipidemia, unspecified: Secondary | ICD-10-CM | POA: Diagnosis not present

## 2022-10-27 DIAGNOSIS — Z0000 Encounter for general adult medical examination without abnormal findings: Secondary | ICD-10-CM | POA: Diagnosis not present

## 2022-10-27 DIAGNOSIS — Z7189 Other specified counseling: Secondary | ICD-10-CM

## 2022-10-27 DIAGNOSIS — I10 Essential (primary) hypertension: Secondary | ICD-10-CM | POA: Diagnosis not present

## 2022-10-27 DIAGNOSIS — R202 Paresthesia of skin: Secondary | ICD-10-CM | POA: Diagnosis not present

## 2022-10-27 MED ORDER — METOPROLOL SUCCINATE ER 100 MG PO TB24: 100.0000 mg | ORAL_TABLET | Freq: Every day | ORAL | 3 refills | Status: DC

## 2022-10-27 MED ORDER — PRAVASTATIN SODIUM 20 MG PO TABS: 20.0000 mg | ORAL_TABLET | Freq: Every day | ORAL | 3 refills | Status: DC

## 2022-10-27 MED ORDER — OMEPRAZOLE 20 MG PO CPDR: DELAYED_RELEASE_CAPSULE | ORAL | 3 refills | Status: DC

## 2022-10-27 NOTE — Patient Instructions (Signed)
Go to the lab on the way out.   If you have mychart we'll likely use that to update you.    Change to once daily metoprolol succinate.  Update me as needed.  Take care.  Glad to see you.

## 2022-10-27 NOTE — Progress Notes (Unsigned)
Elevated Cholesterol: Using medications without problems: yes Muscle aches: no  Diet compliance: d/w pt.   Exercise: d/w pt .   Hypertension:    Using medication without problems or lightheadedness: had been on metoprolol '100mg'$  QD.   Chest pain with exertion:no Edema:no Short of breath:no Labs d/w pt.    Tingling x4, more in the hands than in the feet.  Noted in the feet in the early AM.  Splinting didn't help much.  Altered but not absent sensation w/o weakness.   Flu 2023 Shingles discussed with patient PNA 2022 Tetanus 2013 COVID-vaccine previously done Colonoscopy 2023 Breast cancer screening 2023 Bone density test 2022 Advance directive-daughter Estill Bamberg designated if patient were incapacitated.  PMH and SH reviewed  Meds, vitals, and allergies reviewed.   ROS: Per HPI unless specifically indicated in ROS section   GEN: nad, alert and oriented HEENT: ncat NECK: supple w/o LA CV: rrr. PULM: ctab, no inc wob ABD: soft, +bs EXT: no edema SKIN: no acute rash Normal sensation and motor function in the ext x4

## 2022-10-27 NOTE — Patient Instructions (Signed)
Tina Delgado , Thank you for taking time to come for your Medicare Wellness Visit. I appreciate your ongoing commitment to your health goals. Please review the following plan we discussed and let me know if I can assist you in the future.   These are the goals we discussed:  Goals       Patient Stated     Weight (lb) < 200 lb (90.7 kg) (pt-stated)      Eat a healthy diet Cut back on soda Stay active        This is a list of the screening recommended for you and due dates:  Health Maintenance  Topic Date Due   DTaP/Tdap/Td vaccine (3 - Td or Tdap) 06/10/2022   COVID-19 Vaccine (5 - 2023-24 season) 11/12/2022*   Zoster (Shingles) Vaccine (1 of 2) 01/26/2023*   Medicare Annual Wellness Visit  10/28/2023   Mammogram  07/20/2024   Colon Cancer Screening  11/26/2024   Pneumonia Vaccine  Completed   Flu Shot  Completed   DEXA scan (bone density measurement)  Completed   Hepatitis C Screening: USPSTF Recommendation to screen - Ages 14-79 yo.  Completed   HPV Vaccine  Aged Out  *Topic was postponed. The date shown is not the original due date.    Advanced directives: End of life planning; Advance aging; Advanced directives discussed.  Copy of current HCPOA/Living Will requested.    Conditions/risks identified: none new  Next appointment: Follow up in one year for your annual wellness visit    Preventive Care 65 Years and Older, Female Preventive care refers to lifestyle choices and visits with your health care provider that can promote health and wellness. What does preventive care include? A yearly physical exam. This is also called an annual well check. Dental exams once or twice a year. Routine eye exams. Ask your health care provider how often you should have your eyes checked. Personal lifestyle choices, including: Daily care of your teeth and gums. Regular physical activity. Eating a healthy diet. Avoiding tobacco and drug use. Limiting alcohol use. Practicing safe  sex. Taking low-dose aspirin every day. Taking vitamin and mineral supplements as recommended by your health care provider. What happens during an annual well check? The services and screenings done by your health care provider during your annual well check will depend on your age, overall health, lifestyle risk factors, and family history of disease. Counseling  Your health care provider may ask you questions about your: Alcohol use. Tobacco use. Drug use. Emotional well-being. Home and relationship well-being. Sexual activity. Eating habits. History of falls. Memory and ability to understand (cognition). Work and work Statistician. Reproductive health. Screening  You may have the following tests or measurements: Height, weight, and BMI. Blood pressure. Lipid and cholesterol levels. These may be checked every 5 years, or more frequently if you are over 70 years old. Skin check. Lung cancer screening. You may have this screening every year starting at age 81 if you have a 30-pack-year history of smoking and currently smoke or have quit within the past 15 years. Fecal occult blood test (FOBT) of the stool. You may have this test every year starting at age 67. Flexible sigmoidoscopy or colonoscopy. You may have a sigmoidoscopy every 5 years or a colonoscopy every 10 years starting at age 82. Hepatitis C blood test. Hepatitis B blood test. Sexually transmitted disease (STD) testing. Diabetes screening. This is done by checking your blood sugar (glucose) after you have not eaten for a  while (fasting). You may have this done every 1-3 years. Bone density scan. This is done to screen for osteoporosis. You may have this done starting at age 52. Mammogram. This may be done every 1-2 years. Talk to your health care provider about how often you should have regular mammograms. Talk with your health care provider about your test results, treatment options, and if necessary, the need for more  tests. Vaccines  Your health care provider may recommend certain vaccines, such as: Influenza vaccine. This is recommended every year. Tetanus, diphtheria, and acellular pertussis (Tdap, Td) vaccine. You may need a Td booster every 10 years. Zoster vaccine. You may need this after age 72. Pneumococcal 13-valent conjugate (PCV13) vaccine. One dose is recommended after age 34. Pneumococcal polysaccharide (PPSV23) vaccine. One dose is recommended after age 12. Talk to your health care provider about which screenings and vaccines you need and how often you need them. This information is not intended to replace advice given to you by your health care provider. Make sure you discuss any questions you have with your health care provider. Document Released: 11/08/2015 Document Revised: 07/01/2016 Document Reviewed: 08/13/2015 Elsevier Interactive Patient Education  2017 [employer] Prevention in the Home Falls can cause injuries. They can happen to people of all ages. There are many things you can do to make your home safe and to help prevent falls. What can I do on the outside of my home? Regularly fix the edges of walkways and driveways and fix any cracks. Remove anything that might make you trip as you walk through a door, such as a raised step or threshold. Trim any bushes or trees on the path to your home. Use bright outdoor lighting. Clear any walking paths of anything that might make someone trip, such as rocks or tools. Regularly check to see if handrails are loose or broken. Make sure that both sides of any steps have handrails. Any raised decks and porches should have guardrails on the edges. Have any leaves, snow, or ice cleared regularly. Use sand or salt on walking paths during winter. Clean up any spills in your garage right away. This includes oil or grease spills. What can I do in the bathroom? Use night lights. Install grab bars by the toilet and in the tub and shower.  Do not use towel bars as grab bars. Use non-skid mats or decals in the tub or shower. If you need to sit down in the shower, use a plastic, non-slip stool. Keep the floor dry. Clean up any water that spills on the floor as soon as it happens. Remove soap buildup in the tub or shower regularly. Attach bath mats securely with double-sided non-slip rug tape. Do not have throw rugs and other things on the floor that can make you trip. What can I do in the bedroom? Use night lights. Make sure that you have a light by your bed that is easy to reach. Do not use any sheets or blankets that are too big for your bed. They should not hang down onto the floor. Have a firm chair that has side arms. You can use this for support while you get dressed. Do not have throw rugs and other things on the floor that can make you trip. What can I do in the kitchen? Clean up any spills right away. Avoid walking on wet floors. Keep items that you use a lot in easy-to-reach places. If you need to reach something above you,  use a strong step stool that has a grab bar. Keep electrical cords out of the way. Do not use floor polish or wax that makes floors slippery. If you must use wax, use non-skid floor wax. Do not have throw rugs and other things on the floor that can make you trip. What can I do with my stairs? Do not leave any items on the stairs. Make sure that there are handrails on both sides of the stairs and use them. Fix handrails that are broken or loose. Make sure that handrails are as long as the stairways. Check any carpeting to make sure that it is firmly attached to the stairs. Fix any carpet that is loose or worn. Avoid having throw rugs at the top or bottom of the stairs. If you do have throw rugs, attach them to the floor with carpet tape. Make sure that you have a light switch at the top of the stairs and the bottom of the stairs. If you do not have them, ask someone to add them for you. What else  can I do to help prevent falls? Wear shoes that: Do not have high heels. Have rubber bottoms. Are comfortable and fit you well. Are closed at the toe. Do not wear sandals. If you use a stepladder: Make sure that it is fully opened. Do not climb a closed stepladder. Make sure that both sides of the stepladder are locked into place. Ask someone to hold it for you, if possible. Clearly mark and make sure that you can see: Any grab bars or handrails. First and last steps. Where the edge of each step is. Use tools that help you move around (mobility aids) if they are needed. These include: Canes. Walkers. Scooters. Crutches. Turn on the lights when you go into a dark area. Replace any light bulbs as soon as they burn out. Set up your furniture so you have a clear path. Avoid moving your furniture around. If any of your floors are uneven, fix them. If there are any pets around you, be aware of where they are. Review your medicines with your doctor. Some medicines can make you feel dizzy. This can increase your chance of falling. Ask your doctor what other things that you can do to help prevent falls. This information is not intended to replace advice given to you by your health care provider. Make sure you discuss any questions you have with your health care provider. Document Released: 08/08/2009 Document Revised: 03/19/2016 Document Reviewed: 11/16/2014 Elsevier Interactive Patient Education  2017 [employer].

## 2022-10-27 NOTE — Progress Notes (Signed)
Subjective:   Tina Delgado is a 67 y.o. female who presents for Medicare Annual (Subsequent) preventive examination.  Review of Systems    No ROS.  Medicare Wellness Virtual Visit.  Visual/audio telehealth visit, UTA vital signs.   See social history for additional risk factors.   Cardiac Risk Factors include: advanced age (>53mn, >>32women);hypertension     Objective:    Today's Vitals   10/27/22 1002  Weight: 267 lb (121.1 kg)  Height: 5' 5" (1.651 m)   Body mass index is 44.43 kg/m.     10/27/2022   10:09 AM 10/01/2022    9:55 AM 09/30/2021   10:05 AM 09/26/2020   10:28 AM 09/27/2019    9:13 AM 03/27/2019   10:37 AM 09/20/2018    4:16 PM  Advanced Directives  Does Patient Have a Medical Advance Directive? Yes Yes Yes No No No No  Type of [place] Living will;Healthcare Power of Attorney      Does patient want to make changes to medical advance directive? No - Patient declined        Copy of [place] Chart? No - copy requested No - copy requested       Would patient like information on creating a medical advance directive?     No - Patient declined  No - Patient declined    Current Medications (verified) Outpatient Encounter Medications as of 10/27/2022  Medication Sig   acetaminophen (TYLENOL) 500 MG tablet Take 1,000 mg every 6 (six) hours as needed by mouth for moderate pain or headache.    amoxicillin (AMOXIL) 500 MG capsule Take 2,000 mg by mouth See admin instructions. Take 2000 mg by mouth 1 hour prior to dental procdures (Patient not taking: Reported on 09/11/2022)   Calcium Carb-Cholecalciferol (CALCIUM 600 + D PO) Take 1 tablet by mouth 2 (two) times daily.   cetirizine-pseudoephedrine (ZYRTEC-D) 5-120 MG tablet Take 1 tablet by mouth 2 (two) times daily.   docusate sodium (COLACE) 100 MG capsule Take 100 mg daily as needed by mouth for mild  constipation.   fluticasone (FLONASE) 50 MCG/ACT nasal spray Place 1 spray into both nostrils daily as needed for allergies or rhinitis.   metoprolol tartrate (LOPRESSOR) 100 MG tablet Take 1 tablet (100 mg total) by mouth 2 (two) times daily.   omeprazole (PRILOSEC) 20 MG capsule TAKE 1 CAPSULE BY MOUTH as needed   pravastatin (PRAVACHOL) 20 MG tablet TAKE 1 TABLET BY MOUTH EVERY DAY   No facility-administered encounter medications on file as of 10/27/2022.    Allergies (verified) Simvastatin, Latex, Lisinopril, and Sulfonamide derivatives   History: Past Medical History:  Diagnosis Date   Allergy    Arthritis    s/p knee injection per ortho   Breast cancer ([place] 2014   RT LUMPECTOMY   Depression    Diabetes mellitus without complication ([place]    Dyspnea    Elevated glucose 05/2003   106   GERD (gastroesophageal reflux disease)    Hyperlipidemia    Hypertension    Malignant neoplasm of upper-outer quadrant of female breast ([place] July 20, 2013   histologic grade 3 invasive mammary carcinoma, 13 mm,  2/16 nodes positive, ER-positive, PR positive, HER-2/neu not amplified.T1c,N1a   Mammographic microcalcification    Morbid obesity ([place]    Personal history of chemotherapy 2014   BREAST CA   Personal history of radiation therapy 2014  BREAST CA   Torn rotator cuff 12/2015   left   Past Surgical History:  Procedure Laterality Date   BREAST EXCISIONAL BIOPSY Right 06/2013   [facility], clear margins, LN positive   BREAST SURGERY Right 07/20/2013   Wide excision, SLN biopsy, axillary dissection.   COLONOSCOPY  [id number]   JOINT REPLACEMENT  07/30/2007   LTKR Dr Derrel Nip   KNEE ARTHROSCOPY  03/04/2007   left Dr Derrel Nip   [facility] PLACEMENT  2014   s/p chemotherapy Right    right breast cancer    s/p radiation therapy Right    Whole breast radiation, right.   TOTAL KNEE ARTHROPLASTY Right 01/19/2017   Procedure: TOTAL KNEE ARTHROPLASTY;  Surgeon: Corky Mull, MD;  Location:  [facility] ORS;  Service: Orthopedics;  Laterality: Right;   TOTAL SHOULDER ARTHROPLASTY Left 2019   TUBAL LIGATION  ~1986   BTL   TUBAL LIGATION     Family History  Problem Relation Age of Onset   Heart disease Mother        MI   Depression Mother        paranoia   Hypertension Mother    Stroke Mother    Parkinsonism Mother    Colon polyps Father    Heart disease Father    Heart disease Brother        MI PTCA at 15   Diabetes Maternal Aunt    Diabetes Maternal Uncle    Diabetes Maternal Uncle    Colon cancer Paternal Uncle    Diabetes Maternal Grandfather    Stomach cancer Paternal Grandfather    Breast cancer Cousin    Esophageal cancer Neg Hx    Rectal cancer Neg Hx    Social History   Socioeconomic History   Marital status: Legally Separated    Spouse name: Not on file   Number of children: Not on file   Years of education: Not on file   Highest education level: Not on file  Occupational History   Not on file  Tobacco Use   Smoking status: Never   Smokeless tobacco: Never   Tobacco comments:    smoked in high school - not a full pack  Vaping Use   Vaping Use: Never used  Substance and Sexual Activity   Alcohol use: Yes    Alcohol/week: 0.0 standard drinks of alcohol    Comment: rare   Drug use: No   Sexual activity: Not on file  Other Topics Concern   Not on file  Social History Narrative   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[employer], data management   Part time work at [employer] as of 2021 (had been for years)   2 grown daughters (1 lives with patient)   Enjoys movies, yardwork   Social Determinants of Health   Financial Resource Strain: Low Risk  (10/27/2022)   Overall Financial Resource Strain (CARDIA)    Difficulty of Paying Living Expenses: Not hard at all  Food Insecurity: No Food Insecurity (10/27/2022)   Hunger Vital Sign    Worried About Running Out of Food in the Last Year: Never true    [place] in the Last Year: Never true   Transportation Needs: No Transportation Needs (10/27/2022)   PRAPARE - Hydrologist (Medical): No    Lack of Transportation (Non-Medical): No  Physical Activity: Not on file  Stress: No Stress Concern Present (10/27/2022)   [place] -  Occupational Stress Questionnaire    Feeling of Stress : Not at all  Social Connections: Moderately Integrated (10/27/2022)   Social Connection and Isolation Panel [NHANES]    Frequency of Communication with Friends and Family: More than three times a week    Frequency of Social Gatherings with Friends and Family: More than three times a week    Attends Religious Services: More than 4 times per year    Active Member of [employer] or Organizations: Yes    Attends Archivist Meetings: Never    Marital Status: Separated    Tobacco Counseling Counseling given: Not Answered Tobacco comments: smoked in high school - not a full pack   Clinical Intake:  Pre-visit preparation completed: Yes        Diabetes: No  How often do you need to have someone help you when you read instructions, pamphlets, or other written materials from your doctor or pharmacy?: 1 - Never    Interpreter Needed?: No      Activities of Daily Living    10/27/2022   10:12 AM  In your present state of health, do you have any difficulty performing the following activities:  Hearing? 0  Vision? 0  Difficulty concentrating or making decisions? 0  Walking or climbing stairs? 0  Comment Paces self when walking  Dressing or bathing? 0  Doing errands, shopping? 0  Preparing Food and eating ? N  Using the Toilet? N  In the past six months, have you accidently leaked urine? Y  Comment Managed with daily pad as needed  Do you have problems with loss of bowel control? N  Managing your Medications? N  Managing your Finances? N  Housekeeping or managing your Housekeeping? N    Patient Care Team: Tonia Ghent, MD  as PCP - General (Family Medicine) Tonia Ghent, MD as Consulting Physician (Family Medicine) Bary Castilla Forest Gleason, MD as Consulting Physician (General Surgery) Earlie Server, MD as Consulting Physician (Oncology)  Indicate any recent [place] you may have received from other than [facility] providers in the past year (date may be approximate).     Assessment:   This is a routine wellness examination for Tina Delgado.  I connected with  Tina Delgado on 10/27/22 by a audio enabled telemedicine application and verified that I am speaking with the correct person using two identifiers.  Patient Location: Home  Provider Location: Office/Clinic  I discussed the limitations of evaluation and management by telemedicine. The patient expressed understanding and agreed to proceed.   Hearing/Vision screen Hearing Screening - Comments:: Patient is able to hear conversational tones without difficulty.  No issues reported.   Vision Screening - Comments:: Followed by [facility] Wears reader lenses They have seen their ophthalmologist in the last 12 months.    Dietary issues and exercise activities discussed: Current Exercise Habits: The patient does not participate in regular exercise at present    Goals Addressed               This Visit's Progress     Patient Stated     Weight (lb) < 200 lb (90.7 kg) (pt-stated)        Eat a healthy diet Cut back on soda Stay active       Depression Screen    10/27/2022   10:05 AM 10/23/2021    9:07 AM 09/10/2020    8:15 AM 09/20/2018    4:16 PM 08/22/2018    9:47 AM 08/19/2017  9:51 AM  PHQ 2/9 Scores  PHQ - 2 Score 0 0 0 0 0 0    Fall Risk    10/27/2022   10:11 AM 10/23/2021    9:07 AM 09/10/2020    8:15 AM 11/07/2018    6:42 PM 11/07/2018    6:29 PM  Fall Risk   Falls in the past year? 1 0 0    Comment    no falls since last visit no falls since last visit  Number falls in past yr: 0 0 0    Injury with Fall? 1 0  0    Comment Last fall 01/2022      Risk for fall due to :  No Fall Risks     Follow up Falls evaluation completed;Education provided;Falls prevention discussed Falls evaluation completed Falls evaluation completed      FALL RISK PREVENTION PERTAINING TO THE HOME: Home free of loose throw rugs in walkways, pet beds, electrical cords, etc? Yes  Adequate lighting in your home to reduce risk of falls? Yes   ASSISTIVE DEVICES UTILIZED TO PREVENT FALLS: Life alert? No  Use of a cane, walker or w/c? No   TIMED UP AND GO: Was the test performed? No .   Cognitive Function:        10/27/2022   10:34 AM  6CIT Screen  What Year? 0 points  What month? 0 points  What time? 0 points  Count back from 20 0 points  Months in reverse 0 points  Repeat phrase 0 points  Total Score 0 points    Immunizations Immunization History  Administered Date(s) Administered   Fluad Quad(high Dose 65+) 09/04/2019, 09/10/2020, 09/11/2022   Influenza Inj Mdck Quad Pf 08/17/2017   Influenza Inj Mdck Quad With Preservative 07/31/2018   Influenza Whole 08/23/2007   Influenza, High Dose Seasonal PF 08/06/2021   Influenza-Unspecified 08/17/2017, 07/26/2018   PFIZER Comirnaty(Gray Top)Covid-19 Tri-Sucrose Vaccine 12/26/2020   PFIZER(Purple Top)SARS-COV-2 Vaccination 12/31/2019, 01/24/2020   PNEUMOCOCCAL CONJUGATE-20 10/23/2021   Pfizer Covid-19 Vaccine Bivalent Booster 107yr & up 08/06/2021   Pneumococcal Polysaccharide-23 09/04/2019   Td 03/30/2002   Tdap 06/10/2012   TDAP status: Due, Education has been provided regarding the importance of this vaccine. Advised may receive this vaccine at local pharmacy or Health Dept. Aware to provide a copy of the vaccination record if obtained from local pharmacy or Health Dept. Verbalized acceptance and understanding.  Covid-19 vaccine status: Completed vaccines x4  Shingrix Completed?: No.    Education has been provided regarding the importance of this vaccine.  Patient has been advised to call insurance company to determine out of pocket expense if they have not yet received this vaccine. Advised may also receive vaccine at local pharmacy or Health Dept. Verbalized acceptance and understanding.  Screening Tests Health Maintenance  Topic Date Due   DTaP/Tdap/Td (3 - Td or Tdap) 06/10/2022   COVID-19 Vaccine (5 - 2023-24 season) 11/12/2022 (Originally 06/26/2022)   Zoster Vaccines- Shingrix (1 of 2) 01/26/2023 (Originally 07/10/1973)   Medicare Annual Wellness ([place]  10/28/2023   MAMMOGRAM  07/20/2024   COLONOSCOPY (Pts 45-483yrInsurance coverage will need to be confirmed)  11/26/2024   Pneumonia Vaccine 67Years old  Completed   INFLUENZA VACCINE  Completed   DEXA SCAN  Completed   Hepatitis C Screening  Completed   HPV VACCINES  Aged Out   Health Maintenance Health Maintenance Due  Topic Date Due   DTaP/Tdap/Td (3 - Td or Tdap) 06/10/2022  Lung Cancer Screening: (Low Dose CT Chest recommended if Age 95-80 years, 30 pack-year currently smoking OR have quit w/in 15years.) does not qualify.   Vision Screening: Recommended annual ophthalmology exams for early detection of glaucoma and other disorders of the eye.  Dental Screening: Recommended annual dental exams for proper oral hygiene  Community Resource Referral / Chronic Care Management: CRR required this visit?  No   CCM required this visit?  No      Plan:     I have personally reviewed and noted the following in the patient's chart:   Medical and social history Use of alcohol, tobacco or illicit drugs  Current medications and supplements including opioid prescriptions. Patient is not currently taking opioid prescriptions. Functional ability and status Nutritional status Physical activity Advanced directives List of other physicians Hospitalizations, surgeries, and ER visits in previous 12 months Vitals Screenings to include cognitive, depression, and falls Referrals and  appointments  In addition, I have reviewed and discussed with patient certain preventive protocols, quality metrics, and best practice recommendations. A written personalized care plan for preventive services as well as general preventive health recommendations were provided to patient.     Leta Jungling, LPN   11/29/5359

## 2022-10-28 ENCOUNTER — Encounter: Payer: Self-pay | Admitting: Family Medicine

## 2022-10-28 DIAGNOSIS — E538 Deficiency of other specified B group vitamins: Secondary | ICD-10-CM | POA: Insufficient documentation

## 2022-10-28 LAB — VITAMIN B12: Vitamin B-12: 179 pg/mL — ABNORMAL LOW (ref 211–911)

## 2022-10-28 LAB — TSH: TSH: 3.12 u[IU]/mL (ref 0.35–5.50)

## 2022-10-28 NOTE — Assessment & Plan Note (Signed)
Changed to metoprolol XL 100 mg a day.  Continue work on diet and exercise.  Labs discussed with patient.

## 2022-10-28 NOTE — Assessment & Plan Note (Signed)
See notes on labs, especially regarding B12.

## 2022-10-28 NOTE — Assessment & Plan Note (Signed)
Daughter Estill Bamberg designated in case patient were incapacitated.

## 2022-10-28 NOTE — Assessment & Plan Note (Signed)
Flu 2023 Shingles discussed with patient PNA 2022 Tetanus 2013 COVID-vaccine previously done Colonoscopy 2023 Breast cancer screening 2023 Bone density test 2022 Advance directive-daughter Tina Delgado designated if patient were incapacitated.

## 2022-10-28 NOTE — Assessment & Plan Note (Signed)
Continue pravastatin.  Continue work on diet and exercise.

## 2022-10-31 ENCOUNTER — Other Ambulatory Visit: Payer: Self-pay | Admitting: Family Medicine

## 2022-10-31 DIAGNOSIS — E538 Deficiency of other specified B group vitamins: Secondary | ICD-10-CM

## 2022-10-31 MED ORDER — "SYRINGE/NEEDLE (DISP) 25G X 1"" 3 ML MISC": 1 refills | Status: DC

## 2022-10-31 MED ORDER — CYANOCOBALAMIN 1000 MCG/ML IJ SOLN: INTRAMUSCULAR | 3 refills | Status: DC

## 2022-11-01 ENCOUNTER — Other Ambulatory Visit: Payer: Self-pay | Admitting: Family Medicine

## 2022-11-01 DIAGNOSIS — E538 Deficiency of other specified B group vitamins: Secondary | ICD-10-CM

## 2022-11-02 ENCOUNTER — Other Ambulatory Visit (HOSPITAL_COMMUNITY): Payer: Self-pay

## 2022-11-02 ENCOUNTER — Telehealth: Payer: Self-pay

## 2022-11-02 ENCOUNTER — Encounter: Payer: Self-pay | Admitting: Hematology and Oncology

## 2022-11-02 ENCOUNTER — Encounter: Payer: Self-pay | Admitting: Family Medicine

## 2022-11-02 NOTE — Telephone Encounter (Signed)
Pharmacy Patient Advocate Encounter   Received notification that prior authorization for Cyanocobalamin 1000MCG/ML solution is required/requested.   PA submitted on 11/02/22 to (ins) [employer] via CoverMyMeds Key [id number] Status is pending

## 2022-11-03 NOTE — Telephone Encounter (Signed)
Pharmacy Patient Advocate Encounter  Received notification from [place] [facility] that the request for prior authorization for Cyanocobalamin 1000MCG/ML solution has been denied due to Vitamins and minerals are excluded from coverage under the Medicare Part D drug plan.    How would you like to proceed?  Please be advised appeals may take up to 5 business days to be submitted as pharmacist prepares necessary documentation.  Thank you!  Key: [id number]

## 2022-11-03 NOTE — Telephone Encounter (Signed)
Is there any way to appeal this? The treatment is medically indicated.  Thanks.

## 2022-11-04 NOTE — Telephone Encounter (Signed)
There is an E-appeal option available (Key: [id number]) and our team can get that process started for you.  Please be advised appeals may take up to 5 business days to be submitted as our team prepares necessary documentation.  Thank you!

## 2022-11-06 ENCOUNTER — Other Ambulatory Visit (HOSPITAL_COMMUNITY): Payer: Self-pay

## 2022-11-06 NOTE — Telephone Encounter (Signed)
Expedited appeal has been submitted. Will update once we receive a response.

## 2022-11-06 NOTE — Telephone Encounter (Signed)
Pt picked up rx at [facility] 11/06/2022 and paid with discount card.

## 2022-11-10 NOTE — Telephone Encounter (Signed)
Appeal was denied 

## 2023-01-28 ENCOUNTER — Other Ambulatory Visit: Payer: Self-pay | Admitting: Physician Assistant

## 2023-02-01 ENCOUNTER — Other Ambulatory Visit (INDEPENDENT_AMBULATORY_CARE_PROVIDER_SITE_OTHER): Payer: Medicare Other

## 2023-02-01 DIAGNOSIS — E538 Deficiency of other specified B group vitamins: Secondary | ICD-10-CM

## 2023-02-01 LAB — VITAMIN B12: Vitamin B-12: 548 pg/mL (ref 211–911)

## 2023-02-04 ENCOUNTER — Ambulatory Visit
Admission: RE | Admit: 2023-02-04 | Discharge: 2023-02-04 | Disposition: A | Discharge status: Home / Self Care | Payer: Medicare Other | Source: Ambulatory Visit | Attending: Oncology | Admitting: Oncology

## 2023-02-04 ENCOUNTER — Other Ambulatory Visit: Payer: Self-pay | Admitting: Family Medicine

## 2023-02-04 DIAGNOSIS — C50311 Malignant neoplasm of lower-inner quadrant of right female breast: Secondary | ICD-10-CM | POA: Diagnosis present

## 2023-02-04 DIAGNOSIS — Z170 Estrogen receptor positive status [ER+]: Secondary | ICD-10-CM | POA: Diagnosis present

## 2023-02-04 DIAGNOSIS — E538 Deficiency of other specified B group vitamins: Secondary | ICD-10-CM

## 2023-02-04 MED ORDER — CYANOCOBALAMIN 1000 MCG/ML IJ SOLN: INTRAMUSCULAR | 3 refills | Status: DC

## 2023-02-05 ENCOUNTER — Other Ambulatory Visit: Payer: Self-pay | Admitting: Family Medicine

## 2023-02-05 DIAGNOSIS — R928 Other abnormal and inconclusive findings on diagnostic imaging of breast: Secondary | ICD-10-CM

## 2023-02-05 DIAGNOSIS — N63 Unspecified lump in unspecified breast: Secondary | ICD-10-CM

## 2023-02-07 ENCOUNTER — Telehealth: Payer: Self-pay | Admitting: Family Medicine

## 2023-02-07 NOTE — Telephone Encounter (Signed)
Please check on patient and make sure she is set up for follow-up breast biopsy.  Let me know if she has any trouble getting scheduled.  Thanks.

## 2023-02-08 ENCOUNTER — Encounter: Payer: Self-pay | Admitting: Internal Medicine

## 2023-02-08 ENCOUNTER — Ambulatory Visit (INDEPENDENT_AMBULATORY_CARE_PROVIDER_SITE_OTHER): Payer: Medicare Other | Admitting: Internal Medicine

## 2023-02-08 ENCOUNTER — Telehealth: Payer: Self-pay

## 2023-02-08 VITALS — BP 122/76 | HR 60 | Temp 97.3°F | Ht 65.0 in | Wt 271.0 lb

## 2023-02-08 DIAGNOSIS — T7840XA Allergy, unspecified, initial encounter: Secondary | ICD-10-CM | POA: Diagnosis not present

## 2023-02-08 NOTE — Assessment & Plan Note (Signed)
Likely plant oil or something from the ground--that she then touched to her eye on the gloves Eye is quiet Swelling around eye has improved Discussed using antihistamine like zyretc or claritin would be best option for the itching/swelling

## 2023-02-08 NOTE — Telephone Encounter (Signed)
-----   Message from Rickard Patience, MD sent at 02/08/2023  8:28 AM EDT ----- Please arrange her to get US guided biopsy of indeterminate 8 mm complicated cystic mass of left breast

## 2023-02-08 NOTE — Telephone Encounter (Signed)
Called and spoke with patient, she states her follow up breast biopsy is scheduled for Friday, 02/12/2023

## 2023-02-08 NOTE — Telephone Encounter (Signed)
Noted. Thanks.

## 2023-02-08 NOTE — Telephone Encounter (Signed)
Tina Delgado will contact pt to schedule biopsy.

## 2023-02-08 NOTE — Progress Notes (Signed)
Subjective:    Patient ID: Tina Delgado, female    DOB: Jan 13, 1956, 67 y.o.   MRN: [id number]  HPI Here due to eye swelling  Was out working in the yard 2 days ago Raking leaves and picking up sticks (used gloves) Thinks she got something in her eye--noted rubbing her eye that night Noticed itching Went out that night----next morning (yesterday) was close to swollen shut  Used cool compresses and used eye drops (lubricant)---some better  Some cough---relates to allergies Used flonase---last took zyrtec at least a week ago  Current Outpatient Medications on File Prior to Visit  Medication Sig Dispense Refill   acetaminophen (TYLENOL) 500 MG tablet Take 1,000 mg every 6 (six) hours as needed by mouth for moderate pain or headache.      amoxicillin (AMOXIL) 500 MG capsule Take 2,000 mg by mouth See admin instructions. Take 2000 mg by mouth 1 hour prior to dental procdures     Calcium Carb-Cholecalciferol (CALCIUM 600 + D PO) Take 1 tablet by mouth 2 (two) times daily.     cetirizine-pseudoephedrine (ZYRTEC-D) 5-120 MG tablet Take 1 tablet by mouth 2 (two) times daily.     cyanocobalamin (VITAMIN B12) 1000 MCG/ML injection Inject 1000 mcg IM monthly 4 mL 3   docusate sodium (COLACE) 100 MG capsule Take 100 mg daily as needed by mouth for mild constipation.     fluticasone (FLONASE) 50 MCG/ACT nasal spray Place 1 spray into both nostrils daily as needed for allergies or rhinitis. 48 g 3   metoprolol succinate (TOPROL-XL) 100 MG 24 hr tablet Take 1 tablet (100 mg total) by mouth daily. Take with or immediately following a meal. 90 tablet 3   omeprazole (PRILOSEC) 20 MG capsule TAKE 1 CAPSULE BY MOUTH as needed 90 capsule 3   pravastatin (PRAVACHOL) 20 MG tablet Take 1 tablet (20 mg total) by mouth daily. 90 tablet 3   SYRINGE-NEEDLE, DISP, 3 ML 25G X 1" 3 ML MISC Use as directed with B12 injections 10 each 1   No current facility-administered medications on file prior to visit.     Allergies  Allergen Reactions   Simvastatin Other (See Comments)    Myalgias.     Latex Rash and Other (See Comments)    More adhesive allergy than latex.  Has never been RAST tested. Thinks paper tape is okay.   Lisinopril Cough   Sulfonamide Derivatives Hives and Other (See Comments)    REACTION: itching years ago    Past Medical History:  Diagnosis Date   Allergy    Arthritis    s/p knee injection per ortho   Breast cancer 2014   RT LUMPECTOMY   Depression    Dyspnea    Elevated glucose 05/2003   106   GERD (gastroesophageal reflux disease)    Hyperlipidemia    Hypertension    Malignant neoplasm of upper-outer quadrant of female breast 07/20/2013   histologic grade 3 invasive mammary carcinoma, 13 mm,  2/16 nodes positive, ER-positive, PR positive, HER-2/neu not amplified.T1c,N1a   Mammographic microcalcification    Morbid obesity    Personal history of chemotherapy 2014   BREAST CA   Personal history of radiation therapy 2014   BREAST CA   Torn rotator cuff 12/2015   left    Past Surgical History:  Procedure Laterality Date   BREAST EXCISIONAL BIOPSY Right 06/2013   [facility], clear margins, LN positive   BREAST SURGERY Right 07/20/2013   Wide excision, SLN biopsy,  axillary dissection.   COLONOSCOPY  [id number]   JOINT REPLACEMENT  07/30/2007   LTKR Dr Kennith [facility]   KNEE ARTHROSCOPY  03/04/2007   left Dr [facility]ith [facility]   [facility] PLACEMENT  2014   s/p chemotherapy Right    right breast cancer    s/p radiation therapy Right    Whole breast radiation, right.   TOTAL KNEE ARTHROPLASTY Right 01/19/2017   Procedure: TOTAL KNEE ARTHROPLASTY;  Surgeon: Christena Flake, MD;  Location: [facility] ORS;  Service: Orthopedics;  Laterality: Right;   TOTAL SHOULDER ARTHROPLASTY Left 2019   TUBAL LIGATION  ~1986   BTL   TUBAL LIGATION      Family History  Problem Relation Age of Onset   Heart disease Mother        MI   Depression Mother        paranoia   Hypertension Mother    Stroke  Mother    Parkinsonism Mother    Colon polyps Father    Heart disease Father    Heart disease Brother        MI PTCA at 66   Diabetes Maternal Aunt    Diabetes Maternal Uncle    Diabetes Maternal Uncle    Colon cancer Paternal Uncle    Diabetes Maternal Grandfather    Stomach cancer Paternal Grandfather    Breast cancer Cousin    Esophageal cancer Neg Hx    Rectal cancer Neg Hx     Social History   Socioeconomic History   Marital status: Legally Separated    Spouse name: Not on file   Number of children: Not on file   Years of education: Not on file   Highest education level: Not on file  Occupational History   Not on file  Tobacco Use   Smoking status: Never   Smokeless tobacco: Never   Tobacco comments:    smoked in high school - not a full pack  Vaping Use   Vaping Use: Never used  Substance and Sexual Activity   Alcohol use: Yes    Alcohol/week: 0.0 standard drinks of alcohol    Comment: rare   Drug use: No   Sexual activity: Not on file  Other Topics Concern   Not on file  Social History Narrative   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[employer], data management   Part time work at [employer] as of 2021 (had been for years)   2 grown daughters (1 lives with patient)   Enjoys movies, yardwork   Social Determinants of Health   Financial Resource Strain: Low Risk  (10/27/2022)   Overall Financial Resource Strain (CARDIA)    Difficulty of Paying Living Expenses: Not hard at all  Food Insecurity: No Food Insecurity (10/27/2022)   Hunger Vital Sign    Worried About Running Out of Food in the Last Year: Never true    Ran Out of Food in the Last Year: Never true  Transportation Needs: No Transportation Needs (10/27/2022)   PRAPARE - Administrator, Civil Service (Medical): No    Lack of Transportation (Non-Medical): No  Physical Activity: Not on file  Stress: No Stress Concern Present (10/27/2022)   [employer] of Occupational Health -  Occupational Stress Questionnaire    Feeling of Stress : Not at all  Social Connections: Moderately Integrated (10/27/2022)   Social Connection and Isolation Panel [NHANES]    Frequency of Communication with Friends and Family: More than three times a week  Frequency of Social Gatherings with Friends and Family: More than three times a week    Attends Religious Services: More than 4 times per year    Active Member of [employer] or Organizations: Yes    Attends Banker Meetings: Never    Marital Status: Separated  Intimate Partner Violence: Not At Risk (10/27/2022)   Humiliation, Afraid, Rape, and Kick questionnaire    Fear of Current or Ex-Partner: No    Emotionally Abused: No    Physically Abused: No    Sexually Abused: No   Review of Systems Dental surgery on right this morning-took her antibiotic No loss of vision No cold symptoms    Objective:   Physical Exam Constitutional:      Appearance: Normal appearance.  Eyes:     Comments: Mild periorbital swelling--mostly inferolateral--on left No conjunctival injection No apparent foreign body  Neurological:     Mental Status: She is alert.            Assessment & Plan:

## 2023-02-12 ENCOUNTER — Ambulatory Visit
Admission: RE | Admit: 2023-02-12 | Discharge: 2023-02-12 | Disposition: A | Discharge status: Home / Self Care | Payer: Medicare Other | Source: Ambulatory Visit | Attending: Family Medicine | Admitting: Family Medicine

## 2023-02-12 DIAGNOSIS — R928 Other abnormal and inconclusive findings on diagnostic imaging of breast: Secondary | ICD-10-CM | POA: Diagnosis present

## 2023-02-12 DIAGNOSIS — N63 Unspecified lump in unspecified breast: Secondary | ICD-10-CM

## 2023-02-12 HISTORY — PX: BREAST BIOPSY: SHX20

## 2023-02-12 MED ORDER — LIDOCAINE HCL (PF) 1 % IJ SOLN
5.0000 mL | Freq: Once | INTRAMUSCULAR | Status: AC
  Administered 2023-02-12: 5 mL via INTRADERMAL

## 2023-02-12 MED ORDER — LIDOCAINE-EPINEPHRINE 1 %-1:100000 IJ SOLN
10.0000 mL | Freq: Once | INTRAMUSCULAR | Status: AC
  Administered 2023-02-12: 10 mL via INTRADERMAL

## 2023-02-15 LAB — SURGICAL PATHOLOGY

## 2023-02-26 ENCOUNTER — Telehealth: Payer: Self-pay

## 2023-02-26 NOTE — Patient Outreach (Signed)
  Care Coordination   Initial Visit Note   02/26/2023 Name: Tina Delgado MRN: [id number] DOB: 1956/07/12  Tina Delgado is a 67 y.o. year old female who sees Joaquim Nam, MD for primary care. I spoke with  Tina Delgado by phone today.  What matters to the patients health and wellness today?  Patient denies having any nursing and/ or community resource needs.     Goals Addressed             This Visit's Progress    COMPLETED: care coordination activities - no follow up needed.       Interventions Today    Flowsheet Row Most Recent Value  General Interventions   General Interventions Discussed/Reviewed General Interventions Discussed  [Care coordination services discussed and offered. SDOH survey completed. AWV discussed.  Confirmed patient had AWV scheduled.  Advised to contact PCP if care coordination services needed in the future.]              SDOH assessments and interventions completed:  Yes  SDOH Interventions Today    Flowsheet Row Most Recent Value  SDOH Interventions   Food Insecurity Interventions Intervention Not Indicated  Housing Interventions Intervention Not Indicated        Care Coordination Interventions:  Yes, provided   Follow up plan: No further intervention required.   Encounter Outcome:  Pt. Visit Completed   George Ina RN,BSN,CCM [facility] Care Coordination [phone] direct line

## 2023-03-15 ENCOUNTER — Other Ambulatory Visit: Payer: Self-pay | Admitting: Family Medicine

## 2023-03-15 DIAGNOSIS — E538 Deficiency of other specified B group vitamins: Secondary | ICD-10-CM

## 2023-03-15 NOTE — Telephone Encounter (Signed)
Refill request for CYANOCOBALAMIN 1,000 MCG/ML VL   LOV - 10/27/22 Next OV - not scheduled Last refill - 02/04/23 #4 ml/3 - in as no print

## 2023-03-28 ENCOUNTER — Telehealth: Payer: Self-pay | Admitting: Family Medicine

## 2023-03-28 DIAGNOSIS — R918 Other nonspecific abnormal finding of lung field: Secondary | ICD-10-CM

## 2023-03-28 NOTE — Telephone Encounter (Signed)
Please update patient.  Due for f/u CT re: pulmonary nodules.  I put in the order and she should get a call about that.  Thanks.

## 2023-03-29 NOTE — Telephone Encounter (Signed)
Pt called stating she received a voicemail regarding her scheduling a CT scan. Pt called to confirm if Para March send in a request for the scan. Told pt yes, its fine to schedule. Call back # [phone]

## 2023-03-29 NOTE — Telephone Encounter (Signed)
Patient is scheduled for CT on 04/02/23.

## 2023-04-02 ENCOUNTER — Ambulatory Visit
Admission: RE | Admit: 2023-04-02 | Discharge: 2023-04-02 | Disposition: A | Discharge status: Home / Self Care | Payer: Medicare Other | Source: Ambulatory Visit | Attending: Family Medicine | Admitting: Family Medicine

## 2023-04-02 DIAGNOSIS — R918 Other nonspecific abnormal finding of lung field: Secondary | ICD-10-CM

## 2023-06-24 ENCOUNTER — Ambulatory Visit (INDEPENDENT_AMBULATORY_CARE_PROVIDER_SITE_OTHER): Payer: Medicare Other | Admitting: Family Medicine

## 2023-06-24 ENCOUNTER — Encounter: Payer: Self-pay | Admitting: Family Medicine

## 2023-06-24 VITALS — BP 126/84 | HR 75 | Temp 98.2°F | Ht 65.0 in | Wt 278.0 lb

## 2023-06-24 DIAGNOSIS — E538 Deficiency of other specified B group vitamins: Secondary | ICD-10-CM

## 2023-06-24 DIAGNOSIS — R42 Dizziness and giddiness: Secondary | ICD-10-CM | POA: Diagnosis not present

## 2023-06-24 DIAGNOSIS — I10 Essential (primary) hypertension: Secondary | ICD-10-CM

## 2023-06-24 DIAGNOSIS — R739 Hyperglycemia, unspecified: Secondary | ICD-10-CM

## 2023-06-24 MED ORDER — METOPROLOL SUCCINATE ER 100 MG PO TB24: 50.0000 mg | ORAL_TABLET | Freq: Every day | ORAL | Status: DC

## 2023-06-24 NOTE — Progress Notes (Signed)
Today has been a good day.  Sugar 130 this AM, had been down to ~114 one AM.  No DM2 meds.   1st day with sx was about 10 days ago.  Noted upon getting up from bed, upon standing.  No sx with rolling over in bed.  Can last a few hours but episodic- not noted on sitting.  No syncope but possibly presyncope.  [place] SBP ~150s.  Sx improve on sitting.    She changed to once daily metoprolol recently.    H/o B12 def, on replacement, due for dose.  Labs pending.   Meds, vitals, and allergies reviewed.   ROS: Per HPI unless specifically indicated in ROS section   GEN: nad, alert and oriented HEENT: mucous membranes moist NECK: supple w/o LA CV: rrr PULM: ctab, no inc wob ABD: soft, +bs EXT: no edema SKIN: Well-perfused.

## 2023-06-24 NOTE — Patient Instructions (Signed)
Go to the lab on the way out.   If you have mychart we'll likely use that to update you.    Take care.  Glad to see you. Skip metoprolol on Friday.  Cut the metoprolol in half.  Start 1/2 dose daily on Saturday AM.    Update me about how you feel next week.

## 2023-06-25 LAB — COMPREHENSIVE METABOLIC PANEL
ALT: 21 U/L (ref 0–35)
AST: 24 U/L (ref 0–37)
Albumin: 3.8 g/dL (ref 3.5–5.2)
Alkaline Phosphatase: 88 U/L (ref 39–117)
BUN: 15 mg/dL (ref 6–23)
CO2: 28 mEq/L (ref 19–32)
Calcium: 9.3 mg/dL (ref 8.4–10.5)
Chloride: 101 mEq/L (ref 96–112)
Creatinine, Ser: 0.93 mg/dL (ref 0.40–1.20)
GFR: 62.96 mL/min (ref >60.00)
Glucose, Bld: 115 mg/dL — ABNORMAL HIGH (ref 70–99)
Potassium: 4.5 meq/L (ref 3.5–5.1)
Sodium: 140 meq/L (ref 135–145)
Total Bilirubin: 0.4 mg/dL (ref 0.2–1.2)
Total Protein: 6.7 g/dL (ref 6.0–8.3)

## 2023-06-25 LAB — CBC WITH DIFFERENTIAL/PLATELET
Basophils Absolute: 0.2 10*3/uL — ABNORMAL HIGH (ref 0.0–0.1)
Basophils Relative: 1.7 % (ref 0.0–3.0)
Eosinophils Absolute: 0.3 10*3/uL (ref 0.0–0.7)
Eosinophils Relative: 2.6 % (ref 0.0–5.0)
HCT: 45.8 % (ref 36.0–46.0)
Hemoglobin: 15.4 g/dL — ABNORMAL HIGH (ref 12.0–15.0)
Lymphocytes Relative: 25.8 % (ref 12.0–46.0)
Lymphs Abs: 2.8 10*3/uL (ref 0.7–4.0)
MCHC: 33.5 g/dL (ref 30.0–36.0)
MCV: 89.2 fl (ref 78.0–100.0)
Monocytes Absolute: 0.8 10*3/uL (ref 0.1–1.0)
Monocytes Relative: 7.3 % (ref 3.0–12.0)
Neutro Abs: 6.8 10*3/uL (ref 1.4–7.7)
Neutrophils Relative %: 62.6 % (ref 43.0–77.0)
Platelets: 251 10*3/uL (ref 150.0–400.0)
RBC: 5.13 Mil/uL — ABNORMAL HIGH (ref 3.87–5.11)
RDW: 13.8 % (ref 11.5–15.5)
WBC: 10.9 10*3/uL — ABNORMAL HIGH (ref 4.0–10.5)

## 2023-06-25 LAB — HEMOGLOBIN A1C: Hgb A1c MFr Bld: 6.4 % (ref 4.6–6.5)

## 2023-06-25 LAB — TSH: TSH: 2.55 u[IU]/mL (ref 0.35–5.50)

## 2023-06-25 LAB — VITAMIN B12: Vitamin B-12: 282 pg/mL (ref 211–911)

## 2023-06-28 DIAGNOSIS — R42 Dizziness and giddiness: Secondary | ICD-10-CM | POA: Insufficient documentation

## 2023-06-28 NOTE — Assessment & Plan Note (Signed)
See notes on labs. 

## 2023-06-28 NOTE — Assessment & Plan Note (Signed)
See notes on labs.  Likely blood pressure related and not sugar related. Skip metoprolol on Friday.  Cut the metoprolol in half.  Start 1/2 dose daily on Saturday AM.   Update me about status next week.

## 2023-07-24 ENCOUNTER — Other Ambulatory Visit: Payer: Self-pay | Admitting: Family Medicine

## 2023-08-09 LAB — HM DIABETES EYE EXAM

## 2023-10-04 ENCOUNTER — Inpatient Hospital Stay: Payer: Medicare Other | Attending: Oncology

## 2023-10-04 ENCOUNTER — Encounter: Payer: Self-pay | Admitting: Oncology

## 2023-10-04 ENCOUNTER — Inpatient Hospital Stay (HOSPITAL_BASED_OUTPATIENT_CLINIC_OR_DEPARTMENT_OTHER): Payer: Medicare Other | Admitting: Oncology

## 2023-10-04 VITALS — BP 157/85 | HR 74 | Temp 96.0°F | Resp 18 | Wt 281.0 lb

## 2023-10-04 DIAGNOSIS — D751 Secondary polycythemia: Secondary | ICD-10-CM

## 2023-10-04 DIAGNOSIS — Z170 Estrogen receptor positive status [ER+]: Secondary | ICD-10-CM | POA: Diagnosis not present

## 2023-10-04 DIAGNOSIS — C50311 Malignant neoplasm of lower-inner quadrant of right female breast: Secondary | ICD-10-CM

## 2023-10-04 DIAGNOSIS — M85851 Other specified disorders of bone density and structure, right thigh: Secondary | ICD-10-CM | POA: Insufficient documentation

## 2023-10-04 DIAGNOSIS — E559 Vitamin D deficiency, unspecified: Secondary | ICD-10-CM | POA: Diagnosis not present

## 2023-10-04 DIAGNOSIS — Z1231 Encounter for screening mammogram for malignant neoplasm of breast: Secondary | ICD-10-CM

## 2023-10-04 DIAGNOSIS — Z853 Personal history of malignant neoplasm of breast: Secondary | ICD-10-CM | POA: Diagnosis present

## 2023-10-04 DIAGNOSIS — Z803 Family history of malignant neoplasm of breast: Secondary | ICD-10-CM | POA: Diagnosis not present

## 2023-10-04 DIAGNOSIS — R7989 Other specified abnormal findings of blood chemistry: Secondary | ICD-10-CM

## 2023-10-04 DIAGNOSIS — Z800 Family history of malignant neoplasm of digestive organs: Secondary | ICD-10-CM | POA: Diagnosis not present

## 2023-10-04 LAB — CBC WITH DIFFERENTIAL/PLATELET
Abs Immature Granulocytes: 0.06 10*3/uL (ref 0.00–0.07)
Basophils Absolute: 0.1 10*3/uL (ref 0.0–0.1)
Basophils Relative: 1 %
Eosinophils Absolute: 0.2 10*3/uL (ref 0.0–0.5)
Eosinophils Relative: 2 %
HCT: 46.6 % — ABNORMAL HIGH (ref 36.0–46.0)
Hemoglobin: 15 g/dL (ref 12.0–15.0)
Immature Granulocytes: 1 %
Lymphocytes Relative: 26 %
Lymphs Abs: 2.3 10*3/uL (ref 0.7–4.0)
MCH: 28.8 pg (ref 26.0–34.0)
MCHC: 32.2 g/dL (ref 30.0–36.0)
MCV: 89.4 fL (ref 80.0–100.0)
Monocytes Absolute: 0.6 10*3/uL (ref 0.1–1.0)
Monocytes Relative: 6 %
Neutro Abs: 5.6 10*3/uL (ref 1.7–7.7)
Neutrophils Relative %: 64 %
Platelets: 236 10*3/uL (ref 150–400)
RBC: 5.21 MIL/uL — ABNORMAL HIGH (ref 3.87–5.11)
RDW: 12.9 % (ref 11.5–15.5)
WBC: 8.8 10*3/uL (ref 4.0–10.5)
nRBC: 0 % (ref 0.0–0.2)

## 2023-10-04 LAB — COMPREHENSIVE METABOLIC PANEL
ALT: 23 U/L (ref 0–44)
AST: 21 U/L (ref 15–41)
Albumin: 3.7 g/dL (ref 3.5–5.0)
Alkaline Phosphatase: 70 U/L (ref 38–126)
Anion gap: 10 (ref 5–15)
BUN: 14 mg/dL (ref 8–23)
CO2: 30 mmol/L (ref 22–32)
Calcium: 10 mg/dL (ref 8.9–10.3)
Chloride: 98 mmol/L (ref 98–111)
Creatinine, Ser: 0.85 mg/dL (ref 0.44–1.00)
GFR, Estimated: >60 mL/min (ref >60)
Glucose, Bld: 135 mg/dL — ABNORMAL HIGH (ref 70–99)
Potassium: 4.2 mmol/L (ref 3.5–5.1)
Sodium: 138 mmol/L (ref 135–145)
Total Bilirubin: 0.7 mg/dL (ref <1.2)
Total Protein: 7.2 g/dL (ref 6.5–8.1)

## 2023-10-04 LAB — VITAMIN D 25 HYDROXY (VIT D DEFICIENCY, FRACTURES): Vit D, 25-Hydroxy: 29.22 ng/mL — ABNORMAL LOW (ref 30–100)

## 2023-10-04 NOTE — Progress Notes (Addendum)
 Hematology oncology progress note   ASSESSMENT & PLAN:   Breast cancer of lower-inner quadrant of right female breast [facility]) #History of stage IIB right breast cancer s/p lumpectomy  #History of stage IIb right breast cancer, ER/PR positive, HER2 negative S/p lumpectomy and sentinel lymph node biopsy [05/2013], adjuvant chemotherapy and adjuvant radiation.   Finished 5 years of adjuvant endocrine therapy with Arimidex  in November 2020. BCI testing on 10/07/2018 which revealed a 13.1% risk of late recurrence during year 5-10, Low likelihood of benefit of extended endocrine therapy  # continue annual screening mammogram-obtain now.   Erythrocytosis Persistent mild erythrocytosis.  No need for phlebotomy. Patient has risk factors for sleep apnea.  Recommend patient to further discuss with primary care provider and have sleep study done for workup. Count has improved.   Osteopenia of right femoral neck Recommended patient to continue oral calcium  and vitamin D  supplementation. Patient declined bisphosphonate treatments  Low vitamin D  level Recommend patient to take oral calcium  and vitamin D  supplementation-patient does not take consistently. Check Vitamin D  level today.  Level came back low.  Will forward vitamin D  level to patient's primary care provider for further management.  Orders Placed This Encounter  Procedures   MM 3D SCREENING MAMMOGRAM BILATERAL BREAST    Standing Status:   Future    Standing Expiration Date:   10/03/2024    Order Specific Question:   Reason for Exam (SYMPTOM  OR DIAGNOSIS REQUIRED)    Answer:   Breast cancer    Order Specific Question:   Preferred imaging location?    Answer:   [facility]   DG Bone Density    Standing Status:   Future    Standing Expiration Date:   10/03/2024    Order Specific Question:   Reason for Exam (SYMPTOM  OR DIAGNOSIS REQUIRED)    Answer:   Breast cancer    Order Specific Question:   Preferred imaging location?     Answer:   [facility]   CBC with Differential (Cancer Center Only)    Standing Status:   Future    Standing Expiration Date:   10/03/2024   CMP (Cancer Center only)    Standing Status:   Future    Standing Expiration Date:   10/03/2024   Cancer antigen 27.29    Standing Status:   Future    Standing Expiration Date:   10/03/2024   Follow-up in a year All questions were answered. The patient knows to call the clinic with any problems, questions or concerns.  Zelphia Cap, MD, PhD [facility] Hematology Oncology 10/04/2023    Chief Complaint: Tina Delgado is a 67 y.o. female with a history of stage IIB left breast cancer and osteopenia who is seen for 1 year assessment.  PERTINENT ONCOLOGY HISTORY Tina Delgado is a 67 y.o.afemale who has above oncology history reviewed by me today presented for follow up visit for history of stage IIB left breast cancer and osteopenia Patient previously followed up by Dr.Corcoran, patient switched care to me on 09/30/21 Extensive medical record review was performed by me  #History of stage IIB right breast cancer s/p lumpectomy on 05/2013.  Pathology revealed a grade III 1.3 cm lesion with 2 of 16 lymph nodes positive.  Pathologic stage was T1cN1M0 lesion.  Tumor was ER+/PR+ and Her2/neu- . Patient was enrolled on NSABP B 47 clinical trial.  Patient had adjuvant chemotherapy with TC x4. She also received adjuvant radiation. Started on letrozole in 2015  for about a month, switched to Arimidex  due to dysgeusia (metallic taste) Patient completed 5 years of Arimidex  November 2020. Patient had BCI testing on 10/07/2018 which revealed a 13.1% risk of late recurrence during year 5. 10.  Low likelihood of benefit of extended endocrine therapy.  Patient has annual mammogram done for surveillance.  #Osteopenia 11/15/2014 bone density study showed osteopenia  03/17/2017 reviewed osteopenia  04/03/2019 bone density revealed osteopenia  04/09/2021,  bone density reviewed osteopenia  10-year probability of fracture 12.4%. Patient opted not to proceed with bone strengthening agents due to chronic dental problems.   Colonoscopy on 05/26/2019 revealed five 8 to 12 mm polyps in the transverse colon. There were three 6 to 7 mm polyps in the transverse colon, in the ascending colon and in the cecum. There was one 4 mm polyp in the ascending colon.  Pathology revealed tubular adenoma(s) without high-grade dysplasia or malignancy.  Repeat colonoscopy is planned in 2022    07/21/2022, screening bilateral mammogram showed left breast possible asymmetry warrants further evaluation.  Right breast mammogram is suspicious for malignancy. 08/04/2022 unilateral diagnostic mammogram showed left breast probably benign masses, short-term imaging follow-up is recommended.  02/04/23 Left diagnostic breast mamogram  1. There is an indeterminate 8 mm complicated cystic versus complex cystic mass in the LEFT breast at 5 o'clock 4 cm from the nipple. Recommend ultrasound-guided biopsy for definitive characterization. 2. Two additional masses have assumed a benign appearance and no further dedicated imaging follow-up is recommended.  02/12/23 left breast biopsy 5:00 4 cm from nipple  Benign mammary parenchyma with fibrocystic and papillary apocrine changes, with associated florid usual ductal hyperplasia. Negative for atypical proliferative breast disease.    INTERVAL HISTORY Tina Delgado is a 67 y.o. female who has above history reviewed by me today presents for follow up visit for history of breast cancer.   Today patient reports feeling well.   She denies any new breast concerns.  Patient was advised to take calcium  and vitamin D  supplementation.  She admits not taking consistently.   Past Medical History:  Diagnosis Date   Allergy    Arthritis    s/p knee injection per ortho   Breast cancer (HCC) 2014   RT LUMPECTOMY   Depression    Dyspnea     Elevated glucose 05/2003   106   GERD (gastroesophageal reflux disease)    Hyperlipidemia    Hypertension    Malignant neoplasm of upper-outer quadrant of female breast (HCC) 07/20/2013   histologic grade 3 invasive mammary carcinoma, 13 mm,  2/16 nodes positive, ER-positive, PR positive, HER-2/neu not amplified.T1c,N1a   Mammographic microcalcification    Morbid obesity (HCC)    Personal history of chemotherapy 2014   BREAST CA   Personal history of radiation therapy 2014   BREAST CA   Torn rotator cuff 12/2015   left    Past Surgical History:  Procedure Laterality Date   BREAST BIOPSY Left 02/12/2023   US  LT BREAST BX W LOC DEV 1ST LESION IMG BX SPEC US  GUIDE 02/12/2023 [facility]   BREAST EXCISIONAL BIOPSY Right 06/2013   IMC, clear margins, LN positive   BREAST SURGERY Right 07/20/2013   Wide excision, SLN biopsy, axillary dissection.   COLONOSCOPY  [id number]   JOINT REPLACEMENT  07/30/2007   LTKR Dr Dallie   KNEE ARTHROSCOPY  03/04/2007   left Dr Dallie   [facility] PLACEMENT  2014   s/p chemotherapy Right    right breast cancer  s/p radiation therapy Right    Whole breast radiation, right.   TOTAL KNEE ARTHROPLASTY Right 01/19/2017   Procedure: TOTAL KNEE ARTHROPLASTY;  Surgeon: Norleen JINNY Maltos, MD;  Location: [facility] ORS;  Service: Orthopedics;  Laterality: Right;   TOTAL SHOULDER ARTHROPLASTY Left 2019   TUBAL LIGATION  ~1986   BTL   TUBAL LIGATION      Family History  Problem Relation Age of Onset   Heart disease Mother        MI   Depression Mother        paranoia   Hypertension Mother    Stroke Mother    Parkinsonism Mother    Colon polyps Father    Heart disease Father    Heart disease Brother        MI PTCA at 9   Diabetes Maternal Aunt    Diabetes Maternal Uncle    Diabetes Maternal Uncle    Colon cancer Paternal Uncle    Diabetes Maternal Grandfather    Stomach cancer Paternal Grandfather    Breast cancer Cousin    Esophageal cancer Neg Hx     Rectal cancer Neg Hx     Social History:  reports that she has never smoked. She has never used smokeless tobacco. She reports current alcohol use. She reports that she does not use drugs.  Allergies:  Allergies  Allergen Reactions   Simvastatin  Other (See Comments)    Myalgias.     Latex Rash and Other (See Comments)    More adhesive allergy than latex.  Has never been RAST tested. Thinks paper tape is okay.   Lisinopril Cough   Sulfonamide Derivatives Hives and Other (See Comments)    REACTION: itching years ago    Current Medications: Current Outpatient Medications  Medication Sig Dispense Refill   acetaminophen  (TYLENOL ) 500 MG tablet Take 1,000 mg every 6 (six) hours as needed by mouth for moderate pain or headache.      amoxicillin (AMOXIL) 500 MG capsule Take 2,000 mg by mouth See admin instructions. Take 2000 mg by mouth 1 hour prior to dental procdures     Calcium  Carb-Cholecalciferol (CALCIUM  600 + D PO) Take 1 tablet by mouth 2 (two) times daily.     cetirizine-pseudoephedrine (ZYRTEC-D) 5-120 MG tablet Take 1 tablet by mouth 2 (two) times daily.     cyanocobalamin  (VITAMIN B12) 1000 MCG/ML injection INJECT 1000 MCG IM MONTHLY 3 mL 3   docusate sodium  (COLACE) 100 MG capsule Take 100 mg daily as needed by mouth for mild constipation.     fluticasone  (FLONASE ) 50 MCG/ACT nasal spray Place 1 spray into both nostrils daily as needed for allergies or rhinitis. 48 g 3   Lidocaine -Menthol  (NERVIVE ROLL-ON EX) Apply topically. Used daily     metoprolol  succinate (TOPROL -XL) 100 MG 24 hr tablet TAKE 1 TABLET BY MOUTH DAILY. TAKE WITH OR IMMEDIATELY FOLLOWING A MEAL. 90 tablet 3   omeprazole  (PRILOSEC) 20 MG capsule TAKE 1 CAPSULE BY MOUTH AS NEEDED 90 capsule 3   pravastatin  (PRAVACHOL ) 20 MG tablet Take 1 tablet (20 mg total) by mouth daily. 90 tablet 3   SYRINGE-NEEDLE, DISP, 3 ML 25G X 1 3 ML MISC Use as directed with B12 injections 10 each 1   No current  facility-administered medications for this visit.   Review of Systems  Constitutional:  Negative for appetite change, chills, fatigue and fever.  HENT:   Negative for hearing loss and voice change.   Eyes:  Negative  for eye problems.  Respiratory:  Negative for chest tightness and cough.   Cardiovascular:  Negative for chest pain.  Gastrointestinal:  Negative for abdominal distention, abdominal pain and blood in stool.  Endocrine: Negative for hot flashes.  Genitourinary:  Negative for difficulty urinating and frequency.   Musculoskeletal:  Negative for arthralgias.  Skin:  Negative for itching and rash.  Neurological:  Negative for extremity weakness.  Hematological:  Negative for adenopathy.  Psychiatric/Behavioral:  Negative for confusion.      Performance status (ECOG): 0  Physical Exam Vitals and nursing note reviewed.  Constitutional:      General: She is not in acute distress.    Appearance: She is well-developed. She is obese. She is not diaphoretic.  HENT:     Head: Normocephalic and atraumatic.  Eyes:     General: No scleral icterus. Cardiovascular:     Rate and Rhythm: Normal rate and regular rhythm.     Heart sounds: Normal heart sounds. No murmur heard. Pulmonary:     Effort: Pulmonary effort is normal. No respiratory distress.     Breath sounds: Normal breath sounds. No wheezing.  Chest:  Breasts:    Right: Skin change (scarring at 3 o clock, stable) present. No swelling, bleeding, mass, nipple discharge or tenderness.     Left: No swelling, bleeding, mass, nipple discharge, skin change or tenderness.  Abdominal:     General: Bowel sounds are normal. There is no distension.     Palpations: Abdomen is soft. There is no mass.     Tenderness: There is no abdominal tenderness.  Musculoskeletal:        General: No swelling or tenderness. Normal range of motion.     Cervical back: Normal range of motion and neck supple.  Lymphadenopathy:     Head:     Right  side of head: No preauricular, posterior auricular or occipital adenopathy.     Left side of head: No preauricular, posterior auricular or occipital adenopathy.     Cervical: No cervical adenopathy.     Upper Body:     Right upper body: No supraclavicular or axillary adenopathy.     Left upper body: No supraclavicular or axillary adenopathy.     Lower Body: No right inguinal adenopathy. No left inguinal adenopathy.  Skin:    General: Skin is warm and dry.  Neurological:     Mental Status: She is alert and oriented to person, place, and time.  Psychiatric:        Mood and Affect: Mood normal.    Lab    Latest Ref Rng & Units 10/04/2023   10:01 AM 06/24/2023    4:22 PM 10/01/2022    9:36 AM  CBC  WBC 4.0 - 10.5 K/uL 8.8  10.9  7.6   Hemoglobin 12.0 - 15.0 g/dL 84.9  84.5  84.5   Hematocrit 36.0 - 46.0 % 46.6  45.8  45.6   Platelets 150 - 400 K/uL 236  251.0  217       Latest Ref Rng & Units 10/04/2023   10:01 AM 06/24/2023    4:22 PM 10/01/2022    9:36 AM  CMP  Glucose 70 - 99 mg/dL 864  884  875   BUN 8 - 23 mg/dL 14  15  15    Creatinine 0.44 - 1.00 mg/dL 9.14  9.06  9.13   Sodium 135 - 145 mmol/L 138  140  139   Potassium 3.5 - 5.1 mmol/L 4.2  4.5  3.8   Chloride 98 - 111 mmol/L 98  101  103   CO2 22 - 32 mmol/L 30  28  27    Calcium  8.9 - 10.3 mg/dL 89.9  9.3  8.7   Total Protein 6.5 - 8.1 g/dL 7.2  6.7  7.2   Total Bilirubin <1.2 mg/dL 0.7  0.4  0.7   Alkaline Phos 38 - 126 U/L 70  88  70   AST 15 - 41 U/L 21  24  20    ALT 0 - 44 U/L 23  21  19

## 2023-10-04 NOTE — Assessment & Plan Note (Addendum)
Persistent mild erythrocytosis.  No need for phlebotomy. Patient has risk factors for sleep apnea.  Recommend patient to further discuss with primary care provider and have sleep study done for workup. Count has improved.

## 2023-10-04 NOTE — Assessment & Plan Note (Signed)
Recommended patient to continue oral calcium and vitamin D supplementation. Patient declined bisphosphonate treatments

## 2023-10-04 NOTE — Assessment & Plan Note (Addendum)
#  History of stage IIB right breast cancer s/p lumpectomy  #History of stage IIb right breast cancer, ER/PR positive, HER2 negative S/p lumpectomy and sentinel lymph node biopsy [05/2013], adjuvant chemotherapy and adjuvant radiation.   Finished 5 years of adjuvant endocrine therapy with Arimidex in November 2020. BCI testing on 10/07/2018 which revealed a 13.1% risk of late recurrence during year 5-10, Low likelihood of benefit of extended endocrine therapy  # continue annual screening mammogram-obtain now.

## 2023-10-04 NOTE — Assessment & Plan Note (Addendum)
Recommend patient to take oral calcium and vitamin D supplementation-patient does not take consistently. Check Vitamin D level today.  Level came back low.  Will forward vitamin D level to patient's primary care provider for further management.

## 2023-10-05 LAB — CANCER ANTIGEN 27.29: CA 27.29: 18.8 U/mL (ref 0.0–38.6)

## 2023-10-15 ENCOUNTER — Telehealth: Payer: Medicare Other | Admitting: Physician Assistant

## 2023-10-15 DIAGNOSIS — B9689 Other specified bacterial agents as the cause of diseases classified elsewhere: Secondary | ICD-10-CM

## 2023-10-15 DIAGNOSIS — J208 Acute bronchitis due to other specified organisms: Secondary | ICD-10-CM

## 2023-10-15 MED ORDER — BENZONATATE 100 MG PO CAPS: 100.0000 mg | ORAL_CAPSULE | Freq: Three times a day (TID) | ORAL | 0 refills | Status: DC | PRN

## 2023-10-15 MED ORDER — PREDNISONE 20 MG PO TABS
40.0000 mg | ORAL_TABLET | Freq: Every day | ORAL | 0 refills | Status: DC
Start: 2023-10-15 — End: 2023-11-26

## 2023-10-15 MED ORDER — AZITHROMYCIN 250 MG PO TABS: ORAL_TABLET | ORAL | 0 refills | Status: AC

## 2023-10-15 NOTE — Progress Notes (Signed)
 E-Visit for Cough   We are sorry that you are not feeling well.  Here is how we plan to help!  Based on your presentation I believe you most likely have A cough due to bacteria.  When patients have a fever and a productive cough with a change in color or increased sputum production, we are concerned about bacterial bronchitis.  If left untreated it can progress to pneumonia.  If your symptoms do not improve with your treatment plan it is important that you contact your provider.   I have prescribed Azithromyin 250 mg: two tablets now and then one tablet daily for 4 additonal days    In addition you may use A non-prescription cough medication called Mucinex DM: take 2 tablets every 12 hours. and A prescription cough medication called Tessalon Perles 100mg . You may take 1-2 capsules every 8 hours as needed for your cough.  I have also prescribed Prednisone 20mg  Take 2 tablets (40mg ) daily for 5 days.  From your responses in the eVisit questionnaire you describe inflammation in the upper respiratory tract which is causing a significant cough.  This is commonly called Bronchitis and has four common causes:   Allergies Viral Infections Acid Reflux Bacterial Infection Allergies, viruses and acid reflux are treated by controlling symptoms or eliminating the cause. An example might be a cough caused by taking certain blood pressure medications. You stop the cough by changing the medication. Another example might be a cough caused by acid reflux. Controlling the reflux helps control the cough.  USE OF BRONCHODILATOR ("RESCUE") INHALERS: There is a risk from using your bronchodilator too frequently.  The risk is that over-reliance on a medication which only relaxes the muscles surrounding the breathing tubes can reduce the effectiveness of medications prescribed to reduce swelling and congestion of the tubes themselves.  Although you feel brief relief from the bronchodilator inhaler, your asthma may  actually be worsening with the tubes becoming more swollen and filled with mucus.  This can delay other crucial treatments, such as oral steroid medications. If you need to use a bronchodilator inhaler daily, several times per day, you should discuss this with your provider.  There are probably better treatments that could be used to keep your asthma under control.     HOME CARE Only take medications as instructed by your medical team. Complete the entire course of an antibiotic. Drink plenty of fluids and get plenty of rest. Avoid close contacts especially the very young and the elderly Cover your mouth if you cough or cough into your sleeve. Always remember to wash your hands A steam or ultrasonic humidifier can help congestion.   GET HELP RIGHT AWAY IF: You develop worsening fever. You become short of breath You cough up blood. Your symptoms persist after you have completed your treatment plan MAKE SURE YOU  Understand these instructions. Will watch your condition. Will get help right away if you are not doing well or get worse.    Thank you for choosing an e-visit.  Your e-visit answers were reviewed by a board certified advanced clinical practitioner to complete your personal care plan. Depending upon the condition, your plan could have included both over the counter or prescription medications.  Please review your pharmacy choice. Make sure the pharmacy is open so you can pick up prescription now. If there is a problem, you may contact your provider through [employer] and have the prescription routed to another pharmacy.  Your safety is important to [place].  If you have drug allergies check your prescription carefully.   For the next 24 hours you can use MyChart to ask questions about today's visit, request a non-urgent call back, or ask for a work or school excuse. You will get an email in the next two days asking about your experience. I hope that your e-visit has been valuable  and will speed your recovery.   I have spent 5 minutes in review of e-visit questionnaire, review and updating patient chart, medical decision making and response to patient.   Margaretann Loveless, PA-C

## 2023-10-18 ENCOUNTER — Telehealth: Payer: Self-pay | Admitting: *Deleted

## 2023-10-18 NOTE — Telephone Encounter (Signed)
Copied from CRM [id number]. Topic: General - Other >> Oct 15, 2023  4:50 PM Corin V wrote: Reason for CRM: Patient asking for return call regarding her vitamin D. She had trouble understanding the initial voicemail.

## 2023-10-18 NOTE — Telephone Encounter (Signed)
Patient has been contacted.

## 2023-10-19 ENCOUNTER — Other Ambulatory Visit: Payer: Self-pay | Admitting: Family Medicine

## 2023-10-19 DIAGNOSIS — E538 Deficiency of other specified B group vitamins: Secondary | ICD-10-CM

## 2023-10-19 DIAGNOSIS — M85851 Other specified disorders of bone density and structure, right thigh: Secondary | ICD-10-CM

## 2023-10-19 DIAGNOSIS — I10 Essential (primary) hypertension: Secondary | ICD-10-CM

## 2023-10-19 DIAGNOSIS — R739 Hyperglycemia, unspecified: Secondary | ICD-10-CM

## 2023-10-19 NOTE — Telephone Encounter (Signed)
Has been right at year from last time labs were checked. Ok to refill?

## 2023-10-21 NOTE — Telephone Encounter (Signed)
Please schedule yearly visit with me with labs ahead of time.

## 2023-10-25 NOTE — Telephone Encounter (Signed)
Patient has been scheduled

## 2023-11-16 ENCOUNTER — Ambulatory Visit
Admission: RE | Admit: 2023-11-16 | Discharge: 2023-11-16 | Disposition: A | Discharge status: Home / Self Care | Payer: Medicare Other | Source: Ambulatory Visit | Attending: Oncology | Admitting: Oncology

## 2023-11-16 DIAGNOSIS — Z1231 Encounter for screening mammogram for malignant neoplasm of breast: Secondary | ICD-10-CM | POA: Diagnosis present

## 2023-11-17 ENCOUNTER — Ambulatory Visit: Payer: Medicare Other

## 2023-11-17 VITALS — Ht 65.0 in | Wt 281.0 lb

## 2023-11-17 DIAGNOSIS — Z0000 Encounter for general adult medical examination without abnormal findings: Secondary | ICD-10-CM | POA: Diagnosis not present

## 2023-11-17 NOTE — Progress Notes (Signed)
Subjective:   [name] is a 68 y.o. female who presents for Medicare Annual (Subsequent) preventive examination.  Visit Complete: Virtual I connected with  Tina Delgado on 11/17/23 by a audio enabled telemedicine application and verified that I am speaking with the correct person using two identifiers.  Patient Location: Home  Provider Location: Home Office  I discussed the limitations of evaluation and management by telemedicine. The patient expressed understanding and agreed to proceed.  Vital Signs: Because this visit was a virtual/telehealth visit, some criteria may be missing or patient reported. Any vitals not documented were not able to be obtained and vitals that have been documented are patient reported.  Patient Medicare AWV questionnaire was completed by the patient on 11/15/2023; I have confirmed that all information answered by patient is correct and no changes since this date.  Cardiac Risk Factors include: advanced age (>40men, >87 women);dyslipidemia;hypertension;obesity (BMI >30kg/m2);sedentary lifestyle     Objective:    Today's Vitals   11/17/23 1507  Weight: 281 lb (127.5 kg)  Height: 5\' 5"  (1.651 m)  PainSc: 0-No pain   Body mass index is 46.76 kg/m.     11/17/2023    3:18 PM 10/27/2022   10:09 AM 10/01/2022    9:55 AM 09/30/2021   10:05 AM 09/26/2020   10:28 AM 09/27/2019    9:13 AM 03/27/2019   10:37 AM  Advanced Directives  Does Patient Have a Medical Advance Directive? No Yes Yes Yes No No No  Type of Special educational needs teacher of [place];Living will Healthcare Power of [place];Living will Living will;Healthcare Power of Attorney     Does patient want to make changes to medical advance directive?  No - Patient declined       Copy of Healthcare Power of Attorney in Chart?  No - copy requested No - copy requested      Would patient like information on creating a medical advance directive?      No - Patient declined      Current Medications (verified) Outpatient Encounter Medications as of 11/17/2023  Medication Sig   acetaminophen (TYLENOL) 500 MG tablet Take 1,000 mg every 6 (six) hours as needed by mouth for moderate pain or headache.    amoxicillin (AMOXIL) 500 MG capsule Take 2,000 mg by mouth See admin instructions. Take 2000 mg by mouth 1 hour prior to dental procdures   Calcium Carb-Cholecalciferol (CALCIUM 600 + D PO) Take 1 tablet by mouth 2 (two) times daily.   cetirizine-pseudoephedrine (ZYRTEC-D) 5-120 MG tablet Take 1 tablet by mouth 2 (two) times daily.   cyanocobalamin (VITAMIN B12) 1000 MCG/ML injection INJECT 1000 MCG IM MONTHLY   docusate sodium (COLACE) 100 MG capsule Take 100 mg daily as needed by mouth for mild constipation.   fluticasone (FLONASE) 50 MCG/ACT nasal spray Place 1 spray into both nostrils daily as needed for allergies or rhinitis.   Lidocaine-Menthol (NERVIVE ROLL-ON EX) Apply topically. Used daily   metoprolol succinate (TOPROL-XL) 100 MG 24 hr tablet TAKE 1 TABLET BY MOUTH DAILY. TAKE WITH OR IMMEDIATELY FOLLOWING A MEAL.   omeprazole (PRILOSEC) 20 MG capsule TAKE 1 CAPSULE BY MOUTH AS NEEDED   pravastatin (PRAVACHOL) 20 MG tablet TAKE 1 TABLET BY MOUTH EVERY DAY   SYRINGE-NEEDLE, DISP, 3 ML 25G X 1" 3 ML MISC Use as directed with B12 injections   benzonatate (TESSALON) 100 MG capsule Take 1-2 capsules (100-200 mg total) by mouth 3 (three) times daily as needed.  predniSONE (DELTASONE) 20 MG tablet Take 2 tablets (40 mg total) by mouth daily with breakfast.   No facility-administered encounter medications on file as of 11/17/2023.    Allergies (verified) Simvastatin, Latex, Lisinopril, and Sulfonamide derivatives   History: Past Medical History:  Diagnosis Date   Allergy    Arthritis    s/p knee injection per ortho   Breast cancer (HCC) 2014   RT LUMPECTOMY   Depression    Dyspnea    Elevated glucose 05/2003   106   GERD (gastroesophageal reflux disease)     Hyperlipidemia    Hypertension    Malignant neoplasm of upper-outer quadrant of female breast (HCC) 07/20/2013   histologic grade 3 invasive mammary carcinoma, 13 mm,  2/16 nodes positive, ER-positive, PR positive, HER-2/neu not amplified.T1c,N1a   Mammographic microcalcification    Morbid obesity (HCC)    Personal history of chemotherapy 2014   BREAST CA   Personal history of radiation therapy 2014   BREAST CA   Torn rotator cuff 12/2015   left   Past Surgical History:  Procedure Laterality Date   BREAST BIOPSY Left 02/12/2023   [place] LT BREAST/ neg   BREAST EXCISIONAL BIOPSY Right 06/2013   [facility], clear margins, LN positive   BREAST SURGERY Right 07/20/2013   Wide excision, SLN biopsy, axillary dissection.   COLONOSCOPY  [id number]   JOINT REPLACEMENT  07/30/2007   LTKR Dr [facility]   KNEE ARTHROSCOPY  03/04/2007   left Dr [facility]   [facility] PLACEMENT  2014   s/p chemotherapy Right    right breast cancer    s/p radiation therapy Right    Whole breast radiation, right.   TOTAL KNEE ARTHROPLASTY Right 01/19/2017   Procedure: TOTAL KNEE ARTHROPLASTY;  Surgeon: Christena Flake, MD;  Location: [facility] ORS;  Service: Orthopedics;  Laterality: Right;   TOTAL SHOULDER ARTHROPLASTY Left 2019   TUBAL LIGATION  ~1986   BTL   TUBAL LIGATION     Family History  Problem Relation Age of Onset   Heart disease Mother        MI   Depression Mother        paranoia   Hypertension Mother    Stroke Mother    Parkinsonism Mother    Colon polyps Father    Heart disease Father    Heart disease Brother        MI PTCA at 54   Diabetes Maternal Aunt    Diabetes Maternal Uncle    Diabetes Maternal Uncle    Colon cancer Paternal Uncle    Diabetes Maternal Grandfather    Stomach cancer Paternal Grandfather    Breast cancer Cousin    Esophageal cancer Neg Hx    Rectal cancer Neg Hx    Social History   Socioeconomic History   Marital status: Legally Separated    Spouse name: Not on file    Number of children: Not on file   Years of education: Not on file   Highest education level: Associate degree: occupational, Scientist, product/process development, or vocational program  Occupational History   Not on file  Tobacco Use   Smoking status: Never   Smokeless tobacco: Never   Tobacco comments:    smoked in high school - not a full pack  Vaping Use   Vaping status: Never Used  Substance and Sexual Activity   Alcohol use: Yes    Alcohol/week: 0.0 standard drinks of alcohol    Comment: rare   Drug use: No  Sexual activity: Not on file  Other Topics Concern   Not on file  Social History Narrative   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[employer], data management   Part time work at [employer] as of 2021 (had been for years)   2 grown daughters (1 lives with patient)   Enjoys movies, yardwork   Social Drivers of Corporate investment banker Strain: Low Risk  (11/17/2023)   Overall Financial Resource Strain (CARDIA)    Difficulty of Paying Living Expenses: Not hard at all  Food Insecurity: No Food Insecurity (11/17/2023)   Hunger Vital Sign    Worried About Running Out of Food in the Last Year: Never true    Ran Out of Food in the Last Year: Never true  Transportation Needs: No Transportation Needs (11/17/2023)   PRAPARE - Administrator, Civil Service (Medical): No    Lack of Transportation (Non-Medical): No  Physical Activity: Insufficiently Active (11/17/2023)   Exercise Vital Sign    Days of Exercise per Week: 1 day    Minutes of Exercise per Session: 20 min  Stress: No Stress Concern Present (11/17/2023)   [employer] of Occupational Health - Occupational Stress Questionnaire    Feeling of Stress : Only a little  Social Connections: Moderately Integrated (11/17/2023)   Social Connection and Isolation Panel [NHANES]    Frequency of Communication with Friends and Family: Three times a week    Frequency of Social Gatherings with Friends and Family: Twice a week    Attends  Religious Services: More than 4 times per year    Active Member of [employer] or Organizations: Yes    Attends Banker Meetings: 1 to 4 times per year    Marital Status: Separated    Tobacco Counseling Counseling given: Not Answered Tobacco comments: smoked in high school - not a full pack   Clinical Intake:  Pre-visit preparation completed: Yes  Pain : No/denies pain Pain Score: 0-No pain  BMI - recorded: 46.76 Nutritional Status: BMI > 30  Obese Nutritional Risks: None Diabetes: No  How often do you need to have someone help you when you read instructions, pamphlets, or other written materials from your doctor or pharmacy?: 1 - Never  Interpreter Needed?: No  Comments: daughter lives with pt Information entered by :: B.Kahiau Schewe,LPN   Activities of Daily Living    11/15/2023   10:35 AM  In your present state of health, do you have any difficulty performing the following activities:  Hearing? 0  Vision? 0  Difficulty concentrating or making decisions? 0  Walking or climbing stairs? 0  Dressing or bathing? 0  Doing errands, shopping? 0  Preparing Food and eating ? N  Using the Toilet? N  In the past six months, have you accidently leaked urine? Y  Do you have problems with loss of bowel control? N  Managing your Medications? N  Managing your Finances? N  Housekeeping or managing your Housekeeping? N    Patient Care Team: Joaquim Nam, MD as PCP - General (Family Medicine) Joaquim Nam, MD as Consulting Physician (Family Medicine) Lemar Livings Merrily Pew, MD as Consulting Physician (General Surgery) Rickard Patience, MD as Consulting Physician (Oncology)  Indicate any recent Medical Services you may have received from other than [facility] providers in the past year (date may be approximate).     Assessment:   This is a routine wellness examination for Tina Delgado.  Hearing/Vision screen Hearing Screening - Comments::  Pt says her hearing is good Vision Screening  - Comments:: OTC readers only:vision is good Dr Dion Body    Goals Addressed               This Visit's Progress     COMPLETED: Weight (lb) < 200 lb (90.7 kg) (pt-stated)        Eat a healthy diet Cut back on soda Stay active       Depression Screen    11/17/2023    3:15 PM 06/24/2023    3:53 PM 10/27/2022   10:05 AM 10/23/2021    9:07 AM 09/10/2020    8:15 AM 09/20/2018    4:16 PM 08/22/2018    9:47 AM  PHQ 2/9 Scores  PHQ - 2 Score 0 0 0 0 0 0 0  PHQ- 9 Score  2         Fall Risk    11/15/2023   10:35 AM 06/24/2023    3:52 PM 10/27/2022   10:11 AM 10/23/2021    9:07 AM 09/10/2020    8:15 AM  Fall Risk   Falls in the past year? 0 1 1 0 0  Number falls in past yr: 0 0 0 0 0  Injury with Fall? 0 0 1 0 0  Comment   Last fall 01/2022    Risk for fall due to : No Fall Risks No Fall Risks  No Fall Risks   Follow up Education provided;Falls prevention discussed Falls evaluation completed Falls evaluation completed;Education provided;Falls prevention discussed Falls evaluation completed Falls evaluation completed    MEDICARE RISK AT HOME: Medicare Risk at Home Any stairs in or around the home?: (Patient-Rptd) Yes If so, are there any without handrails?: (Patient-Rptd) No Home free of loose throw rugs in walkways, pet beds, electrical cords, etc?: (Patient-Rptd) Yes Adequate lighting in your home to reduce risk of falls?: (Patient-Rptd) Yes Life alert?: (Patient-Rptd) No Use of a cane, walker or w/c?: (Patient-Rptd) No Grab bars in the bathroom?: (Patient-Rptd) No Shower chair or bench in shower?: (Patient-Rptd) No Elevated toilet seat or a handicapped toilet?: (Patient-Rptd) No  TIMED UP AND GO:  Was the test performed?  No    Cognitive Function:        11/17/2023    3:21 PM 10/27/2022   10:34 AM  6CIT Screen  What Year? 0 points 0 points  What month? 0 points 0 points  What time? 0 points 0 points  Count back from 20 0 points 0 points  Months in reverse 0  points 0 points  Repeat phrase 0 points 0 points  Total Score 0 points 0 points    Immunizations Immunization History  Administered Date(s) Administered   Fluad Quad(high Dose 65+) 09/04/2019, 09/10/2020, 09/11/2022   Influenza Inj Mdck Quad Pf 08/17/2017   Influenza Inj Mdck Quad With Preservative 07/31/2018   Influenza Whole 08/23/2007   Influenza, High Dose Seasonal PF 08/06/2021   Influenza-Unspecified 08/17/2017, 07/26/2018   PFIZER Comirnaty(Gray Top)Covid-19 Tri-Sucrose Vaccine 12/26/2020   PFIZER(Purple Top)SARS-COV-2 Vaccination 12/31/2019, 01/24/2020   PNEUMOCOCCAL CONJUGATE-20 10/23/2021   Pfizer Covid-19 Vaccine Bivalent Booster 53yrs & up 08/06/2021   Pneumococcal Polysaccharide-23 09/04/2019   Td 03/30/2002   Tdap 06/10/2012    TDAP status: Up to date  Flu Vaccine status: Due, Education has been provided regarding the importance of this vaccine. Advised may receive this vaccine at local pharmacy or Health Dept. Aware to provide a copy of the vaccination record if obtained from local pharmacy or  Health Dept. Verbalized acceptance and understanding.  Pneumococcal vaccine status: Up to date  Covid-19 vaccine status: Completed vaccines  Qualifies for Shingles Vaccine? Yes   Zostavax completed No   Shingrix Completed?: No.    Education has been provided regarding the importance of this vaccine. Patient has been advised to call insurance company to determine out of pocket expense if they have not yet received this vaccine. Advised may also receive vaccine at local pharmacy or Health Dept. Verbalized acceptance and understanding.  Screening Tests Health Maintenance  Topic Date Due   Zoster Vaccines- Shingrix (1 of 2) Never done   DTaP/Tdap/Td (3 - Td or Tdap) 06/10/2022   COVID-19 Vaccine (5 - 2024-25 season) 06/27/2023   INFLUENZA VACCINE  01/24/2024 (Originally 05/27/2023)   Medicare Annual Wellness (AWV)  11/16/2024   Colonoscopy  11/26/2024   MAMMOGRAM  11/15/2025    Pneumonia Vaccine 58+ Years old  Completed   DEXA SCAN  Completed   Hepatitis C Screening  Completed   HPV VACCINES  Aged Out    Health Maintenance  Health Maintenance Due  Topic Date Due   Zoster Vaccines- Shingrix (1 of 2) Never done   DTaP/Tdap/Td (3 - Td or Tdap) 06/10/2022   COVID-19 Vaccine (5 - 2024-25 season) 06/27/2023    Colorectal cancer screening: Type of screening: Colonoscopy. Completed 11/26/2021. Repeat every 3 years  Mammogram status: Completed 11/16/2023. Repeat every year  Bone Density status: Completed JYN:WGNF 04/03/21; scheduled 12/16/2023. Results reflect: Bone density results: OSTEOPENIA. Repeat every 3-5 years.  Lung Cancer Screening: (Low Dose CT Chest recommended if Age 54-80 years, 20 pack-year currently smoking OR have quit w/in 15years.) does not qualify.   Lung Cancer Screening Referral: no  Additional Screening:  Hepatitis C Screening: does not qualify; Completed 11/13/2017  Vision Screening: Recommended annual ophthalmology exams for early detection of glaucoma and other disorders of the eye. Is the patient up to date with their annual eye exam?  Yes  Who is the provider or what is the name of the office in which the patient attends annual eye exams? Dr Dion Body If pt is not established with a provider, would they like to be referred to a provider to establish care? No .   Dental Screening: Recommended annual dental exams for proper oral hygiene  Diabetic Foot Exam: n/a  Community Resource Referral / Chronic Care Management: CRR required this visit?  No   CCM required this visit?  No     Plan:     I have personally reviewed and noted the following in the patient's chart:   Medical and social history Use of alcohol, tobacco or illicit drugs  Current medications and supplements including opioid prescriptions. Patient is not currently taking opioid prescriptions. Functional ability and status Nutritional status Physical  activity Advanced directives List of other physicians Hospitalizations, surgeries, and ER visits in previous 12 months Vitals Screenings to include cognitive, depression, and falls Referrals and appointments  In addition, I have reviewed and discussed with patient certain preventive protocols, quality metrics, and best practice recommendations. A written personalized care plan for preventive services as well as general preventive health recommendations were provided to patient.     Sue Lush, LPN   04/15/3085   After Visit Summary: (MyChart) Due to this being a telephonic visit, the after visit summary with patients personalized plan was offered to patient via MyChart   Nurse Notes: The patient states she is doing well and has no concerns or questions at this  time.

## 2023-11-19 ENCOUNTER — Other Ambulatory Visit (INDEPENDENT_AMBULATORY_CARE_PROVIDER_SITE_OTHER): Payer: Medicare Other

## 2023-11-19 DIAGNOSIS — I10 Essential (primary) hypertension: Secondary | ICD-10-CM | POA: Diagnosis not present

## 2023-11-19 DIAGNOSIS — E538 Deficiency of other specified B group vitamins: Secondary | ICD-10-CM | POA: Diagnosis not present

## 2023-11-19 DIAGNOSIS — M85851 Other specified disorders of bone density and structure, right thigh: Secondary | ICD-10-CM

## 2023-11-19 DIAGNOSIS — R739 Hyperglycemia, unspecified: Secondary | ICD-10-CM

## 2023-11-19 LAB — LIPID PANEL
Cholesterol: 201 mg/dL — ABNORMAL HIGH (ref 0–200)
HDL: 44.6 mg/dL (ref >39.00)
LDL Cholesterol: 118 mg/dL — ABNORMAL HIGH (ref 0–99)
NonHDL: 156
Total CHOL/HDL Ratio: 4
Triglycerides: 189 mg/dL — ABNORMAL HIGH (ref 0.0–149.0)
VLDL: 37.8 mg/dL (ref 0.0–40.0)

## 2023-11-19 LAB — HEMOGLOBIN A1C: Hgb A1c MFr Bld: 6.9 % — ABNORMAL HIGH (ref 4.6–6.5)

## 2023-11-19 LAB — VITAMIN B12: Vitamin B-12: 173 pg/mL — ABNORMAL LOW (ref 211–911)

## 2023-11-19 LAB — VITAMIN D 25 HYDROXY (VIT D DEFICIENCY, FRACTURES): VITD: 28.08 ng/mL — ABNORMAL LOW (ref 30.00–100.00)

## 2023-11-26 ENCOUNTER — Encounter: Payer: Self-pay | Admitting: Family Medicine

## 2023-11-26 ENCOUNTER — Ambulatory Visit (INDEPENDENT_AMBULATORY_CARE_PROVIDER_SITE_OTHER): Payer: Medicare Other | Admitting: Family Medicine

## 2023-11-26 VITALS — BP 140/90 | HR 70 | Temp 98.8°F | Ht 64.57 in | Wt 281.0 lb

## 2023-11-26 DIAGNOSIS — Z7189 Other specified counseling: Secondary | ICD-10-CM

## 2023-11-26 DIAGNOSIS — E538 Deficiency of other specified B group vitamins: Secondary | ICD-10-CM | POA: Diagnosis not present

## 2023-11-26 DIAGNOSIS — Z0000 Encounter for general adult medical examination without abnormal findings: Secondary | ICD-10-CM

## 2023-11-26 DIAGNOSIS — I10 Essential (primary) hypertension: Secondary | ICD-10-CM | POA: Diagnosis not present

## 2023-11-26 DIAGNOSIS — E785 Hyperlipidemia, unspecified: Secondary | ICD-10-CM | POA: Diagnosis not present

## 2023-11-26 DIAGNOSIS — E559 Vitamin D deficiency, unspecified: Secondary | ICD-10-CM

## 2023-11-26 DIAGNOSIS — E119 Type 2 diabetes mellitus without complications: Secondary | ICD-10-CM | POA: Diagnosis not present

## 2023-11-26 DIAGNOSIS — Z23 Encounter for immunization: Secondary | ICD-10-CM | POA: Diagnosis not present

## 2023-11-26 DIAGNOSIS — R7989 Other specified abnormal findings of blood chemistry: Secondary | ICD-10-CM

## 2023-11-26 MED ORDER — CYANOCOBALAMIN 1000 MCG/ML IJ SOLN: INTRAMUSCULAR | 3 refills | Status: AC

## 2023-11-26 MED ORDER — SYRINGE/NEEDLE (DISP) 25G X 1" 3 ML MISC: 1 refills | Status: AC

## 2023-11-26 MED ORDER — METOPROLOL SUCCINATE ER 100 MG PO TB24: 50.0000 mg | ORAL_TABLET | Freq: Every day | ORAL | Status: DC

## 2023-11-26 MED ORDER — METOPROLOL SUCCINATE ER 50 MG PO TB24: 50.0000 mg | ORAL_TABLET | Freq: Every day | ORAL | 3 refills | Status: AC

## 2023-11-26 NOTE — Progress Notes (Unsigned)
Flu 2025 Shingles discussed with patient PNA 2022 Tetanus 2013 COVID-vaccine previously done Colonoscopy 2023 Breast cancer screening 2025 Bone density test pending 2025 Advance directive-daughter Marchelle Folks designated if patient were incapacitated  Elevated Cholesterol: Using medications without problems: yes Muscle aches: no  Diet compliance: d/w pt.   Exercise: d/w pt   Hypertension:               Using medication without problems or lightheadedness: had been on metoprolol 50mg  QD.   Chest pain with exertion:no Edema:no Short of breath:no Labs d/w pt.     B12.  She had skipped a month/dose. D/w pt.  Usually dosed at home.  Tingling x4, more in the hands than in the feet.  Hands not better but not worse.  Foot sx may be some better.    Dm2.  No meds.  A1c 6.9.  Diet and exercise d/w pt.   Low Vit D.  She is on higher dose of vitamin D now, d/w pt.    Meds, vitals, and allergies reviewed.   ROS: Per HPI unless specifically indicated in ROS section   GEN: nad, alert and oriented HEENT: ncat NECK: supple w/o LA CV: rrr PULM: ctab, no inc wob ABD: soft, +bs EXT: no edema SKIN: well perfused.

## 2023-11-26 NOTE — Patient Instructions (Addendum)
Recheck labs prior to a visit in about 3 months.  You don't need to fast.  Use the eat right diet in the meantime.  Take care.  Glad to see you. Let me know if your BP is persistently elevated.

## 2023-11-28 NOTE — Assessment & Plan Note (Signed)
Continue work on diet and exercise, continue pravastatin. Labs d/w pt.

## 2023-11-28 NOTE — Assessment & Plan Note (Signed)
No meds.  A1c 6.9.  Diet and exercise d/w pt.  Low carb diet d/w pt. Recheck labs prior to a visit in about 3 months.

## 2023-11-28 NOTE — Assessment & Plan Note (Signed)
Restart B12 dosing at home and she can see if paresthesias improve.

## 2023-11-28 NOTE — Assessment & Plan Note (Signed)
She is on higher dose of vitamin D now, d/w pt.

## 2023-11-28 NOTE — Assessment & Plan Note (Signed)
Continue work on diet and exercise, continue metoprolol. Update me if BP persistently elevated.

## 2023-11-28 NOTE — Assessment & Plan Note (Signed)
Advance directive-daughter Marchelle Folks designated if patient were incapacitated

## 2023-11-28 NOTE — Assessment & Plan Note (Signed)
Flu 2025 Shingles discussed with patient PNA 2022 Tetanus 2013 COVID-vaccine previously done Colonoscopy 2023 Breast cancer screening 2025 Bone density test pending 2025 Advance directive-daughter Marchelle Folks designated if patient were incapacitated

## 2023-12-10 ENCOUNTER — Encounter: Payer: Self-pay | Admitting: Hematology and Oncology

## 2023-12-16 ENCOUNTER — Other Ambulatory Visit: Payer: Medicare Other

## 2023-12-28 ENCOUNTER — Encounter: Payer: Self-pay | Admitting: Oncology

## 2023-12-28 ENCOUNTER — Ambulatory Visit
Admission: RE | Admit: 2023-12-28 | Discharge: 2023-12-28 | Disposition: A | Discharge status: Home / Self Care | Payer: Medicare Other | Source: Ambulatory Visit | Attending: Oncology | Admitting: Oncology

## 2023-12-28 DIAGNOSIS — C50311 Malignant neoplasm of lower-inner quadrant of right female breast: Secondary | ICD-10-CM | POA: Diagnosis present

## 2023-12-28 DIAGNOSIS — Z170 Estrogen receptor positive status [ER+]: Secondary | ICD-10-CM | POA: Diagnosis present

## 2023-12-28 DIAGNOSIS — M85851 Other specified disorders of bone density and structure, right thigh: Secondary | ICD-10-CM | POA: Diagnosis present

## 2024-02-17 ENCOUNTER — Other Ambulatory Visit (INDEPENDENT_AMBULATORY_CARE_PROVIDER_SITE_OTHER)

## 2024-02-17 ENCOUNTER — Other Ambulatory Visit: Payer: Medicare Other

## 2024-02-17 DIAGNOSIS — E538 Deficiency of other specified B group vitamins: Secondary | ICD-10-CM

## 2024-02-17 DIAGNOSIS — E119 Type 2 diabetes mellitus without complications: Secondary | ICD-10-CM | POA: Diagnosis not present

## 2024-02-17 DIAGNOSIS — E559 Vitamin D deficiency, unspecified: Secondary | ICD-10-CM

## 2024-02-17 LAB — HEMOGLOBIN A1C: Hgb A1c MFr Bld: 6.9 % — ABNORMAL HIGH (ref 4.6–6.5)

## 2024-02-17 LAB — VITAMIN D 25 HYDROXY (VIT D DEFICIENCY, FRACTURES): VITD: 34.2 ng/mL (ref 30.00–100.00)

## 2024-02-17 LAB — VITAMIN B12: Vitamin B-12: 361 pg/mL (ref 211–911)

## 2024-02-18 ENCOUNTER — Encounter: Payer: Self-pay | Admitting: Family Medicine

## 2024-02-24 ENCOUNTER — Ambulatory Visit: Payer: Medicare Other | Admitting: Family Medicine

## 2024-02-29 ENCOUNTER — Encounter: Payer: Self-pay | Admitting: Family Medicine

## 2024-02-29 ENCOUNTER — Ambulatory Visit: Admitting: Family Medicine

## 2024-02-29 VITALS — BP 142/82 | HR 84 | Temp 99.2°F | Ht 64.5 in | Wt 285.4 lb

## 2024-02-29 DIAGNOSIS — E119 Type 2 diabetes mellitus without complications: Secondary | ICD-10-CM | POA: Diagnosis not present

## 2024-02-29 NOTE — Progress Notes (Unsigned)
 Diabetes:  No meds.  Hypoglycemic episodes: no sx  Hyperglycemic episodes: no sx Feet problems: no Blood Sugars averaging: not checked often.   eye exam within last year: seen at [facility].   Requesting records.   A1c d/w pt.    B12 and Vit D now normal.  Prev labs d/w pt.   Meds, vitals, and allergies reviewed.   ROS: Per HPI unless specifically indicated in ROS section   GEN: nad, alert and oriented HEENT: mucous membranes moist NECK: supple w/o LA CV: rrr. PULM: ctab, no inc wob ABD: soft, +bs EXT: no edema SKIN: no acute rash  Diabetic foot exam: Normal inspection No skin breakdown Small medial R1st ray callous. Normal DP pulses Normal sensation to light touch and monofilament Nails normal

## 2024-02-29 NOTE — Patient Instructions (Addendum)
 Go to the lab on the way out.   If you have mychart we'll likely use that to update you.    Take care.  Glad to see you. Recheck in about 3-4 months with A1c at the visit.  Use the eat right diet and try to get to bed a little earlier.

## 2024-03-01 ENCOUNTER — Encounter: Payer: Self-pay | Admitting: Family Medicine

## 2024-03-01 LAB — MICROALBUMIN / CREATININE URINE RATIO
Creatinine,U: 257.8 mg/dL
Microalb Creat Ratio: 5 mg/g (ref 0.0–30.0)
Microalb, Ur: 1.3 mg/dL (ref 0.0–1.9)

## 2024-03-01 NOTE — Assessment & Plan Note (Signed)
 No change in meds.  A1c discussed with patient.  Discussed diet exercise and sleep cycle.  See notes on labs. Recheck in about 3-4 months with A1c at the visit.  She can use the eat right diet and try to regulate her sleep cycle by getting to bed slightly earlier.

## 2024-03-09 ENCOUNTER — Encounter: Payer: Self-pay | Admitting: Family Medicine

## 2024-06-22 ENCOUNTER — Ambulatory Visit: Admitting: Family Medicine

## 2024-06-22 ENCOUNTER — Encounter: Payer: Self-pay | Admitting: Family Medicine

## 2024-06-22 VITALS — BP 140/86 | HR 66 | Temp 97.6°F | Ht 64.5 in | Wt 282.0 lb

## 2024-06-22 DIAGNOSIS — E119 Type 2 diabetes mellitus without complications: Secondary | ICD-10-CM | POA: Diagnosis not present

## 2024-06-22 LAB — POCT GLYCOSYLATED HEMOGLOBIN (HGB A1C): Hemoglobin A1C: 6.6 % — AB (ref 4.0–5.6)

## 2024-06-22 NOTE — Progress Notes (Signed)
 Diabetes:  No meds.  She is working on diet.  She cut back on fried foods.   Hypoglycemic episodes: not recently, cautions d/w pt.  Hyperglycemic episodes: no Feet problems: no Blood Sugars averaging: ~130 or lower.   eye exam within last year: yes A1c improved to 6.6.   She has some B hand but not foot paresthesia, had been using otc nervive cream with some relief.  Normal grip.    Meds, vitals, and allergies reviewed.   ROS: Per HPI unless specifically indicated in ROS section   GEN: nad, alert and oriented HEENT: ncat NECK: supple w/o LA CV: rrr. PULM: ctab, no inc wob ABD: soft, +bs EXT: no edema SKIN: well perfused.

## 2024-06-22 NOTE — Patient Instructions (Addendum)
 I would get a flu shot each fall.   Check with your insurance to see if they will cover the tetanus shot. Recheck in about 6 months at a yearly visit.  Thanks for your effort.  Take care.  Glad to see you.

## 2024-06-26 ENCOUNTER — Ambulatory Visit: Payer: Self-pay | Admitting: Family Medicine

## 2024-06-26 NOTE — Assessment & Plan Note (Signed)
 Blood Sugars averaging: ~130 or lower.   eye exam within last year: yes A1c improved to 6.6.  I thanked her for her effort.  Discussed diet and exercise. Discussed routine vaccinations. I would get a flu shot each fall.   She can check with insurance to see if they will cover the tetanus shot. Recheck in about 6 months at a yearly visit.  I do not suspect her hand symptoms are diabetes related. I asked her to update me as needed.

## 2024-08-07 LAB — OPHTHALMOLOGY REPORT-SCANNED

## 2024-08-08 ENCOUNTER — Encounter: Payer: Self-pay | Admitting: Family Medicine

## 2024-08-21 ENCOUNTER — Encounter: Payer: Self-pay | Admitting: Hematology and Oncology

## 2024-10-03 ENCOUNTER — Inpatient Hospital Stay: Payer: Medicare Other | Attending: Oncology

## 2024-10-03 ENCOUNTER — Encounter: Payer: Self-pay | Admitting: Oncology

## 2024-10-03 ENCOUNTER — Ambulatory Visit: Payer: Medicare Other | Admitting: Oncology

## 2024-10-03 VITALS — BP 163/94 | HR 68 | Temp 96.6°F | Resp 18 | Wt 270.0 lb

## 2024-10-03 DIAGNOSIS — Z853 Personal history of malignant neoplasm of breast: Secondary | ICD-10-CM | POA: Insufficient documentation

## 2024-10-03 DIAGNOSIS — Z1231 Encounter for screening mammogram for malignant neoplasm of breast: Secondary | ICD-10-CM

## 2024-10-03 DIAGNOSIS — Z800 Family history of malignant neoplasm of digestive organs: Secondary | ICD-10-CM | POA: Diagnosis not present

## 2024-10-03 DIAGNOSIS — F1090 Alcohol use, unspecified, uncomplicated: Secondary | ICD-10-CM | POA: Insufficient documentation

## 2024-10-03 DIAGNOSIS — D751 Secondary polycythemia: Secondary | ICD-10-CM | POA: Diagnosis not present

## 2024-10-03 DIAGNOSIS — Z803 Family history of malignant neoplasm of breast: Secondary | ICD-10-CM | POA: Diagnosis not present

## 2024-10-03 DIAGNOSIS — C50311 Malignant neoplasm of lower-inner quadrant of right female breast: Secondary | ICD-10-CM | POA: Diagnosis not present

## 2024-10-03 DIAGNOSIS — M85851 Other specified disorders of bone density and structure, right thigh: Secondary | ICD-10-CM | POA: Insufficient documentation

## 2024-10-03 LAB — CBC WITH DIFFERENTIAL (CANCER CENTER ONLY)
Abs Immature Granulocytes: 0.05 K/uL (ref 0.00–0.07)
Basophils Absolute: 0.1 K/uL (ref 0.0–0.1)
Basophils Relative: 1 %
Eosinophils Absolute: 0.2 K/uL (ref 0.0–0.5)
Eosinophils Relative: 2 %
HCT: 45.8 % (ref 36.0–46.0)
Hemoglobin: 15.6 g/dL — ABNORMAL HIGH (ref 12.0–15.0)
Immature Granulocytes: 1 %
Lymphocytes Relative: 23 %
Lymphs Abs: 2.4 K/uL (ref 0.7–4.0)
MCH: 29.8 pg (ref 26.0–34.0)
MCHC: 34.1 g/dL (ref 30.0–36.0)
MCV: 87.4 fL (ref 80.0–100.0)
Monocytes Absolute: 0.6 K/uL (ref 0.1–1.0)
Monocytes Relative: 6 %
Neutro Abs: 6.9 K/uL (ref 1.7–7.7)
Neutrophils Relative %: 67 %
Platelet Count: 230 K/uL (ref 150–400)
RBC: 5.24 MIL/uL — ABNORMAL HIGH (ref 3.87–5.11)
RDW: 12.5 % (ref 11.5–15.5)
WBC Count: 10.2 K/uL (ref 4.0–10.5)
nRBC: 0 % (ref 0.0–0.2)

## 2024-10-03 LAB — CMP (CANCER CENTER ONLY)
ALT: 20 U/L (ref 0–44)
AST: 22 U/L (ref 15–41)
Albumin: 4.1 g/dL (ref 3.5–5.0)
Alkaline Phosphatase: 87 U/L (ref 38–126)
Anion gap: 9 (ref 5–15)
BUN: 13 mg/dL (ref 8–23)
CO2: 30 mmol/L (ref 22–32)
Calcium: 9.8 mg/dL (ref 8.9–10.3)
Chloride: 101 mmol/L (ref 98–111)
Creatinine: 0.87 mg/dL (ref 0.44–1.00)
GFR, Estimated: >60 mL/min (ref >60)
Glucose, Bld: 130 mg/dL — ABNORMAL HIGH (ref 70–99)
Potassium: 4.6 mmol/L (ref 3.5–5.1)
Sodium: 140 mmol/L (ref 135–145)
Total Bilirubin: 0.5 mg/dL (ref 0.0–1.2)
Total Protein: 7 g/dL (ref 6.5–8.1)

## 2024-10-03 NOTE — Assessment & Plan Note (Addendum)
#  History of stage IIB right breast cancer s/p lumpectomy  #History of stage IIb right breast cancer, ER/PR positive, HER2 negative S/p lumpectomy and sentinel lymph node biopsy [05/2013], adjuvant chemotherapy and adjuvant radiation.   Finished 5 years of adjuvant endocrine therapy with Arimidex  in November 2020. BCI testing on 10/07/2018 which revealed a 13.1% risk of late recurrence during year 5-10, Low likelihood of benefit of extended endocrine therapy  # continue annual screening mammogram-due in January 2026.

## 2024-10-03 NOTE — Assessment & Plan Note (Signed)
 Recommended patient to continue oral calcium  and vitamin D  supplementation. Patient previously declined bisphosphonate treatments

## 2024-10-03 NOTE — Progress Notes (Signed)
 Hematology oncology progress note   ASSESSMENT & PLAN:   Breast cancer of lower-inner quadrant of right female breast [facility]) #History of stage IIB right breast cancer s/p lumpectomy  #History of stage IIb right breast cancer, ER/PR positive, HER2 negative S/p lumpectomy and sentinel lymph node biopsy [05/2013], adjuvant chemotherapy and adjuvant radiation.   Finished 5 years of adjuvant endocrine therapy with Arimidex  in November 2020. BCI testing on 10/07/2018 which revealed a 13.1% risk of late recurrence during year 5-10, Low likelihood of benefit of extended endocrine therapy  # continue annual screening mammogram-due in January 2026.   Erythrocytosis Persistent mild erythrocytosis.  No need for phlebotomy. Patient has risk factors for sleep apnea.  Recommend patient to further discuss with primary care provider and have sleep study done for workup. Count has improved.   Osteopenia of right femoral neck Recommended patient to continue oral calcium  and vitamin D  supplementation. Patient previously declined bisphosphonate treatments   Patient prefers to continue follow-up with oncology clinic annually. Oncology Orders Placed This Encounter  Procedures   MM 3D SCREENING MAMMOGRAM BILATERAL BREAST    Standing Status:   Future    Expected Date:   11/03/2024    Expiration Date:   10/03/2025    Reason for Exam (SYMPTOM  OR DIAGNOSIS REQUIRED):   breast cancer of lower inner quadrant of right breast    Preferred imaging location?:   [facility]   CBC with Differential (Cancer Center Only)    Standing Status:   Future    Expected Date:   10/03/2025    Expiration Date:   01/01/2026   CMP (Cancer Center only)    Standing Status:   Future    Expected Date:   10/03/2025    Expiration Date:   01/01/2026   Cancer antigen 27.29    Standing Status:   Future    Expected Date:   10/03/2025    Expiration Date:   01/01/2026   Follow-up in a year All questions were answered. The patient  knows to call the clinic with any problems, questions or concerns.  Zelphia Cap, MD, PhD [facility] Hematology Oncology 10/03/2024    Chief Complaint: Tina Delgado is a 68 y.o. female with a history of stage IIB left breast cancer and osteopenia who is seen for 1 year assessment.  PERTINENT ONCOLOGY HISTORY Tina Delgado is a 68 y.o.afemale who has above oncology history reviewed by me today presented for follow up visit for history of stage IIB left breast cancer and osteopenia Patient previously followed up by Dr.Corcoran, patient switched care to me on 09/30/21 Extensive medical record review was performed by me  #History of stage IIB right breast cancer s/p lumpectomy on 05/2013.  Pathology revealed a grade III 1.3 cm lesion with 2 of 16 lymph nodes positive.  Pathologic stage was T1cN1M0 lesion.  Tumor was ER+/PR+ and Her2/neu- . Patient was enrolled on NSABP B 47 clinical trial.  Patient had adjuvant chemotherapy with TC x4. She also received adjuvant radiation. Started on letrozole in 2015 for about a month, switched to Arimidex  due to dysgeusia (metallic taste) Patient completed 5 years of Arimidex  November 2020. Patient had BCI testing on 10/07/2018 which revealed a 13.1% risk of late recurrence during year 5. 10.  Low likelihood of benefit of extended endocrine therapy.  Patient has annual mammogram done for surveillance.  #Osteopenia 11/15/2014 bone density study showed osteopenia  03/17/2017 reviewed osteopenia  04/03/2019 bone density revealed osteopenia  04/09/2021, bone density  reviewed osteopenia  10-year probability of fracture 12.4%. Patient opted not to proceed with bone strengthening agents due to chronic dental problems.   Colonoscopy on 05/26/2019 revealed five 8 to 12 mm polyps in the transverse colon. There were three 6 to 7 mm polyps in the transverse colon, in the ascending colon and in the cecum. There was one 4 mm polyp in the ascending colon.   Pathology revealed tubular adenoma(s) without high-grade dysplasia or malignancy.  Repeat colonoscopy is planned in 2022    07/21/2022, screening bilateral mammogram showed left breast possible asymmetry warrants further evaluation.  Right breast mammogram is suspicious for malignancy. 08/04/2022 unilateral diagnostic mammogram showed left breast probably benign masses, short-term imaging follow-up is recommended.  02/04/23 Left diagnostic breast mamogram  1. There is an indeterminate 8 mm complicated cystic versus complex cystic mass in the LEFT breast at 5 o'clock 4 cm from the nipple. Recommend ultrasound-guided biopsy for definitive characterization. 2. Two additional masses have assumed a benign appearance and no further dedicated imaging follow-up is recommended.  02/12/23 left breast biopsy 5:00 4 cm from nipple  Benign mammary parenchyma with fibrocystic and papillary apocrine changes, with associated florid usual ductal hyperplasia. Negative for atypical proliferative breast disease.    INTERVAL HISTORY Tina Delgado is a 68 y.o. female who has above history reviewed by me today presents for follow up visit for history of breast cancer.   Today patient reports feeling well.  She denies any new breast concerns.    Past Medical History:  Diagnosis Date   Allergy    Arthritis    s/p knee injection per ortho   Breast cancer (HCC) 2014   RT LUMPECTOMY   Depression    Dyspnea    GERD (gastroesophageal reflux disease)    Hyperlipidemia    Hypertension    Malignant neoplasm of upper-outer quadrant of female breast (HCC) 07/20/2013   histologic grade 3 invasive mammary carcinoma, 13 mm,  2/16 nodes positive, ER-positive, PR positive, HER-2/neu not amplified.T1c,N1a   Mammographic microcalcification    Morbid obesity (HCC)    Personal history of chemotherapy 2014   BREAST CA   Personal history of radiation therapy 2014   BREAST CA   Torn rotator cuff 12/2015   left     Past Surgical History:  Procedure Laterality Date   BREAST BIOPSY Left 02/12/2023   US  LT BREAST/ neg   BREAST EXCISIONAL BIOPSY Right 06/2013   IM[facility], clear margins, LN positive   BREAST SURGERY Right 07/20/2013   Wide excision, SLN biopsy, axillary dissection.   COLONOSCOPY  [id number]   JOINT REPLACEMENT  07/30/2007   LTKR Dr Dallie   KNEE ARTHROSCOPY  03/04/2007   left Dr Dallie   [facility] PLACEMENT  2014   s/p chemotherapy Right    right breast cancer    s/p radiation therapy Right    Whole breast radiation, right.   TOTAL KNEE ARTHROPLASTY Right 01/19/2017   Procedure: TOTAL KNEE ARTHROPLASTY;  Surgeon: Norleen JINNY Maltos, MD;  Location: [facility] ORS;  Service: Orthopedics;  Laterality: Right;   TOTAL SHOULDER ARTHROPLASTY Left 2019   TUBAL LIGATION  ~1986   BTL    Family History  Problem Relation Age of Onset   Heart disease Mother        MI   Depression Mother        paranoia   Hypertension Mother    Stroke Mother    Parkinsonism Mother    Colon polyps Father  Heart disease Father    Heart disease Brother        MI PTCA at 65   Diabetes Maternal Aunt    Diabetes Maternal Uncle    Diabetes Maternal Uncle    Colon cancer Paternal Uncle    Diabetes Maternal Grandfather    Stomach cancer Paternal Grandfather    Breast cancer Cousin    Esophageal cancer Neg Hx    Rectal cancer Neg Hx     Social History:  reports that she has never smoked. She has never used smokeless tobacco. She reports current alcohol use. She reports that she does not use drugs.  Allergies:  Allergies  Allergen Reactions   Simvastatin  Other (See Comments)    Myalgias.     Latex Rash and Other (See Comments)    More adhesive allergy than latex.  Has never been RAST tested. Thinks paper tape is okay.   Lisinopril Cough   Sulfonamide Derivatives Hives and Other (See Comments)    REACTION: itching years ago    Current Medications: Current Outpatient Medications  Medication Sig Dispense  Refill   acetaminophen  (TYLENOL ) 500 MG tablet Take 1,000 mg every 6 (six) hours as needed by mouth for moderate pain or headache.      Calcium  Carb-Cholecalciferol (CALCIUM  600 + D PO) Take 1 tablet by mouth 2 (two) times daily.     cetirizine-pseudoephedrine (ZYRTEC-D) 5-120 MG tablet Take 1 tablet by mouth 2 (two) times daily.     Cholecalciferol (VITAMIN D3) 50 MCG (2000 UT) TABS Take 50 mcg by mouth 2 (two) times daily.     cyanocobalamin  (VITAMIN B12) 1000 MCG/ML injection INJECT 1000 MCG IM MONTHLY 3 mL 3   docusate sodium  (COLACE) 100 MG capsule Take 100 mg daily as needed by mouth for mild constipation.     fluticasone  (FLONASE ) 50 MCG/ACT nasal spray Place 1 spray into both nostrils daily as needed for allergies or rhinitis. 48 g 3   Lidocaine -Menthol  (NERVIVE ROLL-ON EX) Apply topically. Used daily     metoprolol  succinate (TOPROL -XL) 50 MG 24 hr tablet Take 1 tablet (50 mg total) by mouth daily. TAKE WITH OR IMMEDIATELY FOLLOWING A MEAL. 90 tablet 3   omeprazole  (PRILOSEC) 20 MG capsule TAKE 1 CAPSULE BY MOUTH AS NEEDED 90 capsule 3   pravastatin  (PRAVACHOL ) 20 MG tablet TAKE 1 TABLET BY MOUTH EVERY DAY 90 tablet 3   simethicone (MYLICON) 125 MG chewable tablet Chew 125 mg by mouth every 6 (six) hours as needed for flatulence.     SYRINGE-NEEDLE, DISP, 3 ML 25G X 1 3 ML MISC Use as directed with B12 injections 10 each 1   amoxicillin (AMOXIL) 500 MG capsule Take 2,000 mg by mouth See admin instructions. Take 2000 mg by mouth 1 hour prior to dental procdures (Patient not taking: Reported on 10/03/2024)     No current facility-administered medications for this visit.   Review of Systems  Constitutional:  Negative for appetite change, chills, fatigue and fever.  HENT:   Negative for hearing loss and voice change.   Eyes:  Negative for eye problems.  Respiratory:  Negative for chest tightness and cough.   Cardiovascular:  Negative for chest pain.  Gastrointestinal:  Negative for  abdominal distention, abdominal pain and blood in stool.  Endocrine: Negative for hot flashes.  Genitourinary:  Negative for difficulty urinating and frequency.   Musculoskeletal:  Negative for arthralgias.  Skin:  Negative for itching and rash.  Neurological:  Negative for extremity weakness.  Hematological:  Negative for adenopathy.  Psychiatric/Behavioral:  Negative for confusion.      Performance status (ECOG): 0  Physical Exam Vitals and nursing note reviewed.  Constitutional:      General: She is not in acute distress.    Appearance: She is well-developed. She is obese. She is not diaphoretic.  HENT:     Head: Normocephalic and atraumatic.  Eyes:     General: No scleral icterus. Cardiovascular:     Rate and Rhythm: Normal rate and regular rhythm.     Heart sounds: Normal heart sounds. No murmur heard. Pulmonary:     Effort: Pulmonary effort is normal. No respiratory distress.     Breath sounds: Normal breath sounds. No wheezing.  Chest:  Breasts:    Right: Skin change (scarring at 3 o clock, stable) present. No swelling, bleeding, mass, nipple discharge or tenderness.     Left: No swelling, bleeding, mass, nipple discharge, skin change or tenderness.  Abdominal:     General: Bowel sounds are normal. There is no distension.     Palpations: Abdomen is soft. There is no mass.     Tenderness: There is no abdominal tenderness.  Musculoskeletal:        General: No swelling or tenderness. Normal range of motion.     Cervical back: Normal range of motion and neck supple.  Lymphadenopathy:     Head:     Right side of head: No preauricular, posterior auricular or occipital adenopathy.     Left side of head: No preauricular, posterior auricular or occipital adenopathy.     Cervical: No cervical adenopathy.     Upper Body:     Right upper body: No supraclavicular or axillary adenopathy.     Left upper body: No supraclavicular or axillary adenopathy.     Lower Body: No right  inguinal adenopathy. No left inguinal adenopathy.  Skin:    General: Skin is warm and dry.  Neurological:     Mental Status: She is alert and oriented to person, place, and time.  Psychiatric:        Mood and Affect: Mood normal.    Lab    Latest Ref Rng & Units 10/03/2024   10:40 AM 10/04/2023   10:01 AM 06/24/2023    4:22 PM  CBC  WBC 4.0 - 10.5 K/uL 10.2  8.8  10.9   Hemoglobin 12.0 - 15.0 g/dL 84.3  84.9  84.5   Hematocrit 36.0 - 46.0 % 45.8  46.6  45.8   Platelets 150 - 400 K/uL 230  236  251.0       Latest Ref Rng & Units 10/03/2024   10:40 AM 10/04/2023   10:01 AM 06/24/2023    4:22 PM  CMP  Glucose 70 - 99 mg/dL 869  864  884   BUN 8 - 23 mg/dL 13  14  15    Creatinine 0.44 - 1.00 mg/dL 9.12  9.14  9.06   Sodium 135 - 145 mmol/L 140  138  140   Potassium 3.5 - 5.1 mmol/L 4.6  4.2  4.5   Chloride 98 - 111 mmol/L 101  98  101   CO2 22 - 32 mmol/L 30  30  28    Calcium  8.9 - 10.3 mg/dL 9.8  89.9  9.3   Total Protein 6.5 - 8.1 g/dL 7.0  7.2  6.7   Total Bilirubin 0.0 - 1.2 mg/dL 0.5  0.7  0.4   Alkaline Phos 38 - 126  U/L 87  70  88   AST 15 - 41 U/L 22  21  24    ALT 0 - 44 U/L 20  23  21

## 2024-10-03 NOTE — Assessment & Plan Note (Signed)
 Persistent mild erythrocytosis.  No need for phlebotomy. Patient has risk factors for sleep apnea.  Recommend patient to further discuss with primary care provider and have sleep study done for workup. Count has improved.

## 2024-10-04 ENCOUNTER — Ambulatory Visit (INDEPENDENT_AMBULATORY_CARE_PROVIDER_SITE_OTHER): Admitting: Family Medicine

## 2024-10-04 ENCOUNTER — Encounter: Payer: Self-pay | Admitting: Family Medicine

## 2024-10-04 ENCOUNTER — Ambulatory Visit: Payer: Self-pay

## 2024-10-04 VITALS — BP 169/86 | HR 71 | Ht 65.0 in | Wt 271.2 lb

## 2024-10-04 DIAGNOSIS — R42 Dizziness and giddiness: Secondary | ICD-10-CM

## 2024-10-04 DIAGNOSIS — I10 Essential (primary) hypertension: Secondary | ICD-10-CM

## 2024-10-04 LAB — CANCER ANTIGEN 27.29: CA 27.29: 17.8 U/mL (ref 0.0–38.6)

## 2024-10-04 MED ORDER — VALSARTAN 40 MG PO TABS: 40.0000 mg | ORAL_TABLET | Freq: Every day | ORAL | 1 refills | Status: AC

## 2024-10-04 NOTE — Telephone Encounter (Signed)
 Patient is scheduled with an outside provider this afternoon.

## 2024-10-04 NOTE — Telephone Encounter (Signed)
 FYI Only or Action Required?: FYI only for provider: appointment scheduled on 12.10.25.  Patient was last seen in primary care on 06/22/2024 by Cleatus Arlyss RAMAN, MD.  Called Nurse Triage reporting Hypertension and Dizziness.  Symptoms began several days ago.  Interventions attempted: Nothing.  Symptoms are: unchanged.  Triage Disposition: See Physician Within 24 Hours  Patient/caregiver understands and will follow disposition?: Yes   Copied from CRM #[id number]. Topic: Clinical - Red Word Triage >> Oct 04, 2024  9:26 AM Tina Delgado wrote: Tina Delgado that prompted transfer to Nurse Triage: Bp 159/90 .. been high in the mornings the last 3 days - Dizzyness and offbalance when she gets up Reason for Disposition  [1] NO dizziness now AND [2] one or more stroke risk factors (i.e., hypertension, diabetes, prior stroke/TIA/heart attack)  Answer Assessment - Initial Assessment Questions Friday pt has swimmy head feeling, dizzy off balance SBP 140's. Was fine Saturday and Sunday. Since Monday SBP has been in 150's and has had the return of symptoms. BP is taken PRIOR to  her taking medications. Yesterday at oncology and it was SBP158 and then rechecked it was 183 but she thinks that was nerves. She states room spinning, vomited once on Monday. All symptoms resolve after she takes her BP medications. She used a wrist monitor, doesn't typically check BP after meds. Pt denies any numbness, weakness, change of vision, or severe headache.     1. DESCRIPTION: Describe your dizziness.     Swimmy head, room spinning 2. VERTIGO: Do you feel like either you or the room is spinning or tilting?      yes 3. LIGHTHEADED: Do you feel lightheaded? (e.Delgado., somewhat faint, woozy, weak upon standing)     no 4. SEVERITY: How bad is it?  Can you walk?     Just feels off balance, can walk 5. ONSET:  When did the dizziness begin?     Friday, then none Saturday, Sunday then came back on Monday 6. AGGRAVATING  FACTORS: Does anything make it worse? (e.Delgado., standing, change in head position)     Standing up 7. CAUSE: What do you think is causing the dizziness?     unknown 8. RECURRENT SYMPTOM: Have you had dizziness before? If Yes, ask: When was the last time? What happened that time?     no 9. OTHER SYMPTOMS: Do you have any other symptoms? (e.Delgado., earache, headache, numbness, tinnitus, vomiting, weakness)     Denies numbness, weakness Does get slight headache  Protocols used: Dizziness - Vertigo-A-AH

## 2024-10-04 NOTE — Telephone Encounter (Signed)
 I am not in clinic today.  Thank you for getting patient scheduled.

## 2024-10-04 NOTE — Progress Notes (Signed)
 Established patient visit   Patient: Tina Delgado   DOB: Apr 23, 1956   68 y.o. Female  MRN: [id number] Visit Date: 10/04/2024  Today's healthcare provider: Nancyann Perry, MD   Chief Complaint  Patient presents with   Dizziness    Since Friday every morning when she wakes up to stand.   Hypertension    Pt reports BP has been reading high since 5x days ago , dizziness every morning since Friday. Patient's BP reading here in office using her wrist BP cuff is 198/119 P:74   Subjective    Discussed the use of AI scribe software for clinical note transcription with the patient, who gave verbal consent to proceed.  History of Present Illness   Tina Delgado is a 68 year old female with hypertension who presents with dizziness and elevated blood pressure.  She has been experiencing dizziness and feeling 'woozy' and 'stumbly' since last Friday, primarily in the mornings, which she associates with elevated blood pressure readings. A similar episode occurred last spring when her medication was adjusted from metoprolol  tartrate 100 mg to metoprolol  succinate 100 mg, initially causing dizziness and unsteadiness. Her medication was then reduced to metoprolol  succinate 50 mg, which resolved the symptoms until now.  She is currently taking metoprolol  succinate 50 mg regularly and denies running out of the medication. She has not taken Zyrtec D in the past year and does not use other decongestants, only an over-the-counter allergy pill as needed. There have been no recent changes in her diet, although she admits to not eating regularly due to lack of appetite. She participated in a cookie exchange recently, which may have increased her sugar intake but not her salt intake.  No chest pain, heart flutters, or shortness of breath. She experiences some swelling in her feet consistently but has not noticed any increase this week. She also mentions having carpal tunnel syndrome in her  hands. She has a history of an allergic reaction to lisinopril, which caused a cough, and she has not taken other blood pressure medications like valsartan or irbesartan.  She recently had blood work done at the cancer center, which showed a slightly elevated hemoglobin level.     Lab Results  Component Value Date   WBC 10.2 10/03/2024   HGB 15.6 (H) 10/03/2024   HCT 45.8 10/03/2024   MCV 87.4 10/03/2024   PLT 230 10/03/2024   Last metabolic panel Lab Results  Component Value Date   GLUCOSE 130 (H) 10/03/2024   NA 140 10/03/2024   K 4.6 10/03/2024   CL 101 10/03/2024   CO2 30 10/03/2024   BUN 13 10/03/2024   CREATININE 0.87 10/03/2024   GFRNONAA >60 10/03/2024   CALCIUM  9.8 10/03/2024   PHOS 2.2 (L) 09/01/2013   PROT 7.0 10/03/2024   ALBUMIN 4.1 10/03/2024   LABGLOB 2.8 06/19/2013   AGRATIO 1.5 06/19/2013   BILITOT 0.5 10/03/2024   ALKPHOS 87 10/03/2024   AST 22 10/03/2024   ALT 20 10/03/2024   ANIONGAP 9 10/03/2024   Lab Results  Component Value Date   HGBA1C 6.6 (A) 06/22/2024     Medications: Outpatient Medications Prior to Visit  Medication Sig   acetaminophen  (TYLENOL ) 500 MG tablet Take 1,000 mg every 6 (six) hours as needed by mouth for moderate pain or headache.    amoxicillin (AMOXIL) 500 MG capsule Take 2,000 mg by mouth See admin instructions. Take 2000 mg by mouth 1 hour prior to dental procdures (  Patient not taking: Reported on 10/03/2024)   Calcium  Carb-Cholecalciferol (CALCIUM  600 + D PO) Take 1 tablet by mouth 2 (two) times daily.   cetirizine-pseudoephedrine (ZYRTEC-D) 5-120 MG tablet Take 1 tablet by mouth 2 (two) times daily.   Cholecalciferol (VITAMIN D3) 50 MCG (2000 UT) TABS Take 50 mcg by mouth 2 (two) times daily.   cyanocobalamin  (VITAMIN B12) 1000 MCG/ML injection INJECT 1000 MCG IM MONTHLY   docusate sodium  (COLACE) 100 MG capsule Take 100 mg daily as needed by mouth for mild constipation.   fluticasone  (FLONASE ) 50 MCG/ACT nasal spray  Place 1 spray into both nostrils daily as needed for allergies or rhinitis.   Lidocaine -Menthol  (NERVIVE ROLL-ON EX) Apply topically. Used daily   metoprolol  succinate (TOPROL -XL) 50 MG 24 hr tablet Take 1 tablet (50 mg total) by mouth daily. TAKE WITH OR IMMEDIATELY FOLLOWING A MEAL.   omeprazole  (PRILOSEC) 20 MG capsule TAKE 1 CAPSULE BY MOUTH AS NEEDED   pravastatin  (PRAVACHOL ) 20 MG tablet TAKE 1 TABLET BY MOUTH EVERY DAY   simethicone (MYLICON) 125 MG chewable tablet Chew 125 mg by mouth every 6 (six) hours as needed for flatulence.   SYRINGE-NEEDLE, DISP, 3 ML 25G X 1 3 ML MISC Use as directed with B12 injections   No facility-administered medications prior to visit.   Review of Systems  Constitutional:  Negative for appetite change, chills, fatigue and fever.  Respiratory:  Negative for chest tightness and shortness of breath.   Cardiovascular:  Negative for chest pain and palpitations.  Gastrointestinal:  Negative for abdominal pain, nausea and vomiting.  Neurological:  Negative for dizziness and weakness.       Objective    BP (!) 169/86 (BP Location: Left Arm, Patient Position: Sitting, Cuff Size: Large)   Pulse 71   Ht 5' 5 (1.651 m)   Wt 271 lb 3.2 oz (123 kg)   SpO2 98%   BMI 45.13 kg/m  Orthostatic VS for the past 24 hrs (Last 3 readings):  BP- Lying Pulse- Lying BP- Sitting Pulse- Sitting BP- Standing at 0 minutes Pulse- Standing at 0 minutes  10/04/24 1502 168/85 70 176/85 69 (!) 188/98 78    Physical Exam   General: Appearance:    Severely obese female in no acute distress  Eyes:    PERRL, conjunctiva/corneas clear, EOM's intact       Lungs:     Clear to auscultation bilaterally, respirations unlabored  Heart:    Normal heart rate. Normal rhythm. No murmurs, rubs, or gallops.    MS:   All extremities are intact.    Neurologic:   Awake, alert, oriented x 3. No apparent focal neurological defect.          Assessment & Plan       1. Dizziness  (Primary) Not orthostatic. Normal labs from cancer center yesterday. Likely due to labile blood pressure.   2. Essential hypertension Continue current dose of metoprolol  succinate.  - valsartan (DIOVAN) 40 MG tablet; Take 1 tablet (40 mg total) by mouth daily.  Dispense: 30 tablet; Refill: 1   Follow up with PCP within next month.      No follow-ups on file.     Nancyann Perry, MD  [facility] [phone] (phone) [phone] (fax)  [facility]

## 2024-10-06 ENCOUNTER — Other Ambulatory Visit: Payer: Self-pay | Admitting: Family Medicine

## 2024-10-30 ENCOUNTER — Other Ambulatory Visit: Payer: Self-pay | Admitting: Family Medicine

## 2024-10-30 ENCOUNTER — Telehealth: Payer: Self-pay | Admitting: Oncology

## 2024-10-30 NOTE — Telephone Encounter (Signed)
 Lvm for pt to call [facility] to schedule her mammo. Phone number provided.

## 2024-11-13 ENCOUNTER — Encounter: Payer: Self-pay | Admitting: Hematology and Oncology

## 2024-11-20 ENCOUNTER — Ambulatory Visit: Payer: Medicare Other

## 2024-11-20 ENCOUNTER — Encounter: Payer: Self-pay | Admitting: Hematology and Oncology

## 2024-11-20 VITALS — Ht 65.0 in | Wt 271.0 lb

## 2024-11-20 DIAGNOSIS — Z1231 Encounter for screening mammogram for malignant neoplasm of breast: Secondary | ICD-10-CM

## 2024-11-20 DIAGNOSIS — Z0000 Encounter for general adult medical examination without abnormal findings: Secondary | ICD-10-CM | POA: Diagnosis not present

## 2024-11-20 DIAGNOSIS — K635 Polyp of colon: Secondary | ICD-10-CM | POA: Diagnosis not present

## 2024-11-20 NOTE — Progress Notes (Signed)
 "  Chief Complaint  Patient presents with   Medicare Wellness     Subjective:  Please attest and cosign this visit due to patients primary care provider not being in the office at the time the visit was completed.  (Pt of Dr Arlyss Loreli Solian)   Tina Delgado is a 69 y.o. female who presents for a Medicare Annual Wellness Visit.  Visit info / Clinical Intake: Medicare Wellness Visit Type:: Subsequent Annual Wellness Visit Persons participating in visit and providing information:: patient Medicare Wellness Visit Mode:: Video Since this visit was completed virtually, some vitals may be partially provided or unavailable. Missing vitals are due to the limitations of the virtual format.: Documented vitals are patient reported If Telephone or Video please confirm:: I connected with patient using audio/video enable telemedicine. I verified patient identity with two identifiers, discussed telehealth limitations, and patient agreed to proceed. Patient Location:: Home Provider Location:: Office Interpreter Needed?: No Pre-visit prep was completed: yes AWV questionnaire completed by patient prior to visit?: yes Date:: 11/20/24 Living arrangements:: with family/others Patient's Overall Health Status Rating: good Typical amount of pain: some Does pain affect daily life?: no Are you currently prescribed opioids?: no  Dietary Habits and Nutritional Risks How many meals a day?: 2 Eats fruit and vegetables daily?: yes Most meals are obtained by: preparing own meals In the last 2 weeks, have you had any of the following?: none Diabetic:: (!) yes Any non-healing wounds?: no How often do you check your BS?: 1; as needed Would you like to be referred to a Nutritionist or for Diabetic Management? : no  Functional Status Activities of Daily Living (to include ambulation/medication): Independent Ambulation: Independent with device- listed below Home Assistive Devices/Equipment:  Eyeglasses Medication Administration: Independent Home Management (perform basic housework or laundry): Independent Manage your own finances?: yes Primary transportation is: driving Concerns about vision?: no *vision screening is required for WTM* Concerns about hearing?: no  Fall Screening Falls in the past year?: 1 Number of falls in past year: 0 (1 - in 09/2024) Was there an injury with Fall?: 0 Fall Risk Category Calculator: 1 Patient Fall Risk Level: Low Fall Risk  Fall Risk Patient at Risk for Falls Due to: History of fall(s) Fall risk Follow up: Falls evaluation completed; Falls prevention discussed  Home and Transportation Safety: All rugs have non-skid backing?: yes All stairs or steps have railings?: (!) no Grab bars in the bathtub or shower?: yes Have non-skid surface in bathtub or shower?: yes Good home lighting?: yes Regular seat belt use?: yes Hospital stays in the last year:: no  Cognitive Assessment Difficulty concentrating, remembering, or making decisions? : no Will 6CIT or Mini Cog be Completed: yes What year is it?: 0 points What month is it?: 0 points Give patient an address phrase to remember (5 components): [address] [place], [place] About what time is it?: 0 points  Advance Directives (For Healthcare) Does Patient Have a Medical Advance Directive?: Yes Does patient want to make changes to medical advance directive?: Yes (Inpatient - patient requests chaplain consult to change a medical advance directive) Type of Advance Directive: Healthcare Power of [place]; Living will Copy of Healthcare Power of Attorney in Chart?: No - copy requested Copy of Living Will in Chart?: No - copy requested  Reviewed/Updated  Reviewed/Updated: Reviewed All (Medical, Surgical, Family, Medications, Allergies, Care Teams, Patient Goals)    Allergies (verified) Simvastatin , Latex, Lisinopril, and Sulfonamide derivatives   Current Medications (verified) Outpatient  Encounter Medications  as of 11/20/2024  Medication Sig   acetaminophen  (TYLENOL ) 500 MG tablet Take 1,000 mg every 6 (six) hours as needed by mouth for moderate pain or headache.    amoxicillin (AMOXIL) 500 MG capsule Take 2,000 mg by mouth See admin instructions. Take 2000 mg by mouth 1 hour prior to dental procdures   Calcium  Carb-Cholecalciferol (CALCIUM  600 + D PO) Take 1 tablet by mouth 2 (two) times daily.   cetirizine-pseudoephedrine (ZYRTEC-D) 5-120 MG tablet Take 1 tablet by mouth 2 (two) times daily.   Cholecalciferol (VITAMIN D3) 50 MCG (2000 UT) TABS Take 50 mcg by mouth 2 (two) times daily.   cyanocobalamin  (VITAMIN B12) 1000 MCG/ML injection INJECT 1000 MCG IM MONTHLY   docusate sodium  (COLACE) 100 MG capsule Take 100 mg daily as needed by mouth for mild constipation.   fluticasone  (FLONASE ) 50 MCG/ACT nasal spray Place 1 spray into both nostrils daily as needed for allergies or rhinitis.   Lidocaine -Menthol  (NERVIVE ROLL-ON EX) Apply topically. Used daily   metoprolol  succinate (TOPROL -XL) 50 MG 24 hr tablet Take 1 tablet (50 mg total) by mouth daily. TAKE WITH OR IMMEDIATELY FOLLOWING A MEAL.   omeprazole  (PRILOSEC) 20 MG capsule TAKE 1 CAPSULE BY MOUTH AS NEEDED   pravastatin  (PRAVACHOL ) 20 MG tablet TAKE 1 TABLET BY MOUTH EVERY DAY   simethicone (MYLICON) 125 MG chewable tablet Chew 125 mg by mouth every 6 (six) hours as needed for flatulence.   SYRINGE-NEEDLE, DISP, 3 ML 25G X 1 3 ML MISC Use as directed with B12 injections   valsartan  (DIOVAN ) 40 MG tablet Take 1 tablet (40 mg total) by mouth daily.   [DISCONTINUED] metoprolol  succinate (TOPROL -XL) 100 MG 24 hr tablet TAKE 1 TABLET BY MOUTH EVERY DAY WITH OR IMMEDIATELY FOLLOWING A MEAL   No facility-administered encounter medications on file as of 11/20/2024.    History: Past Medical History:  Diagnosis Date   Allergy    Arthritis    s/p knee injection per ortho   Breast cancer (HCC) 2014   RT LUMPECTOMY   Depression     Dyspnea    GERD (gastroesophageal reflux disease)    Hyperlipidemia    Hypertension    Malignant neoplasm of upper-outer quadrant of female breast (HCC) 07/20/2013   histologic grade 3 invasive mammary carcinoma, 13 mm,  2/16 nodes positive, ER-positive, PR positive, HER-2/neu not amplified.T1c,N1a   Mammographic microcalcification    Morbid obesity (HCC)    Personal history of chemotherapy 2014   BREAST CA   Personal history of radiation therapy 2014   BREAST CA   Torn rotator cuff 12/2015   left   Past Surgical History:  Procedure Laterality Date   BREAST BIOPSY Left 02/12/2023   US  LT BREAST/ neg   BREAST EXCISIONAL BIOPSY Right 06/2013   IM[facility], clear margins, LN positive   BREAST SURGERY Right 07/20/2013   Wide excision, SLN biopsy, axillary dissection.   COLONOSCOPY  [id number]   EYE SURGERY  Dec 2019 laser   JOINT REPLACEMENT  07/30/2007   LTKR Dr Dallie   KNEE ARTHROSCOPY  03/04/2007   left Dr Dallie   [facility] PLACEMENT  2014   s/p chemotherapy Right    right breast cancer    s/p radiation therapy Right    Whole breast radiation, right.   TOTAL KNEE ARTHROPLASTY Right 01/19/2017   Procedure: TOTAL KNEE ARTHROPLASTY;  Surgeon: Norleen JINNY Maltos, MD;  Location: [facility] ORS;  Service: Orthopedics;  Laterality: Right;   TOTAL SHOULDER ARTHROPLASTY Left  2019   TUBAL LIGATION  ~1986   BTL   Family History  Problem Relation Age of Onset   Heart disease Mother        MI   Depression Mother        paranoia   Hypertension Mother    Stroke Mother    Parkinsonism Mother    Vision loss Mother    Colon polyps Father    Heart disease Father    Hearing loss Father    Stroke Father    Heart disease Brother        MI PTCA at 55   Diabetes Maternal Aunt    Diabetes Maternal Uncle    Diabetes Maternal Uncle    Colon cancer Paternal Uncle    Cancer Paternal Uncle    Diabetes Maternal Grandfather    Stomach cancer Paternal Grandfather    Cancer Paternal Grandfather    Breast  cancer Cousin    Obesity Maternal Grandmother    Obesity Paternal Grandmother    Esophageal cancer Neg Hx    Rectal cancer Neg Hx    Social History   Occupational History   Not on file  Tobacco Use   Smoking status: Never   Smokeless tobacco: Never   Tobacco comments:    smoked in high school - not a full pack  Vaping Use   Vaping status: Never Used  Substance and Sexual Activity   Alcohol use: Yes    Alcohol/week: 1.0 standard drink of alcohol    Types: 1 Shots of liquor per week    Comment: rare   Drug use: No   Sexual activity: Yes   Tobacco Counseling Counseling given: No Tobacco comments: smoked in high school - not a full pack  SDOH Screenings   Food Insecurity: No Food Insecurity (11/20/2024)  Housing: Low Risk (11/20/2024)  Transportation Needs: No Transportation Needs (11/20/2024)  Utilities: Not At Risk (11/20/2024)  Alcohol Screen: Low Risk (11/20/2024)  Depression (PHQ2-9): Low Risk (11/20/2024)  Financial Resource Strain: Low Risk (11/20/2024)  Physical Activity: Insufficiently Active (11/20/2024)  Social Connections: Moderately Integrated (11/20/2024)  Stress: No Stress Concern Present (11/20/2024)  Tobacco Use: Low Risk (11/20/2024)  Health Literacy: Adequate Health Literacy (11/20/2024)   See flowsheets for full screening details  Depression Screen PHQ 2 & 9 Depression Scale- Over the past 2 weeks, how often have you been bothered by any of the following problems? Little interest or pleasure in doing things: 0 Feeling down, depressed, or hopeless (PHQ Adolescent also includes...irritable): 0 PHQ-2 Total Score: 0 Trouble falling or staying asleep, or sleeping too much: 0 Feeling tired or having little energy: 0 Poor appetite or overeating (PHQ Adolescent also includes...weight loss): 0 Feeling bad about yourself - or that you are a failure or have let yourself or your family down: 0 Trouble concentrating on things, such as reading the newspaper or watching  television (PHQ Adolescent also includes...like school work): 0 Moving or speaking so slowly that other people could have noticed. Or the opposite - being so fidgety or restless that you have been moving around a lot more than usual: 0 Thoughts that you would be better off dead, or of hurting yourself in some way: 0 PHQ-9 Total Score: 0 If you checked off any problems, how difficult have these problems made it for you to do your work, take care of things at home, or get along with other people?: Not difficult at all  Depression Treatment Depression Interventions/Treatment : EYV7-0 Score <4  Follow-up Not Indicated     Goals Addressed               This Visit's Progress     Patient Stated (pt-stated)        Patient stated she plans to continue managing her health and watching her sugar intake             Objective:    Today's Vitals   11/20/24 1519  Weight: 271 lb (122.9 kg)  Height: 5' 5 (1.651 m)   Body mass index is 45.1 kg/m.  Hearing/Vision screen Hearing Screening - Comments:: Denies hearing difficulties   Vision Screening - Comments:: Wears eyeglasses for reading - up to date with routine eye exams with [facility] Immunizations and Health Maintenance Health Maintenance  Topic Date Due   Zoster Vaccines- Shingrix (1 of 2) Never done   DTaP/Tdap/Td (3 - Td or Tdap) 06/10/2022   COVID-19 Vaccine (5 - 2025-26 season) 06/26/2024   Mammogram  11/15/2024   Colonoscopy  11/26/2024   Influenza Vaccine  01/23/2025 (Originally 05/26/2024)   HEMOGLOBIN A1C  12/23/2024   Diabetic kidney evaluation - Urine ACR  02/28/2025   FOOT EXAM  02/28/2025   OPHTHALMOLOGY EXAM  08/07/2025   Diabetic kidney evaluation - eGFR measurement  10/03/2025   Medicare Annual Wellness (AWV)  11/20/2025   Pneumococcal Vaccine: 50+ Years  Completed   Bone Density Scan  Completed   Hepatitis C Screening  Completed   Meningococcal B Vaccine  Aged Out        Assessment/Plan:  This is a  routine wellness examination for Tina Delgado.  Colonoscopy status: ordered today  Mammogram status: ordered today  Patient Care Team: Cleatus Arlyss RAMAN, MD as PCP - General (Family Medicine) Cleatus Arlyss RAMAN, MD as Consulting Physician (Family Medicine) Dessa, Reyes ORN, MD as Consulting Physician (General Surgery) Babara Call, MD as Consulting Physician (Oncology) Portia Fireman, OD (Optometry)  I have personally reviewed and noted the following in the patients chart:   Medical and social history Use of alcohol, tobacco or illicit drugs  Current medications and supplements including opioid prescriptions. Functional ability and status Nutritional status Physical activity Advanced directives List of other physicians Hospitalizations, surgeries, and ER visits in previous 12 months Vitals Screenings to include cognitive, depression, and falls Referrals and appointments  Orders Placed This Encounter  Procedures   MM 3D SCREENING MAMMOGRAM BILATERAL BREAST    Standing Status:   Future    Expiration Date:   11/20/2025    Reason for Exam (SYMPTOM  OR DIAGNOSIS REQUIRED):   screening for breast cancer    Preferred imaging location?:   [facility]   Ambulatory referral to Gastroenterology    Referral Priority:   Routine    Referral Type:   Consultation    Referral Reason:   Specialty Services Required    Referred to Provider:   Stacia Glendia BRAVO, MD    Number of Visits Requested:   1   In addition, I have reviewed and discussed with patient certain preventive protocols, quality metrics, and best practice recommendations. A written personalized care plan for preventive services as well as general preventive health recommendations were provided to patient.   Verdie CHRISTELLA Saba, CMA   11/20/2024   Return in 1 year (on 11/20/2025).  After Visit Summary: (MyChart) Due to this being a telephonic visit, the after visit summary with patients personalized plan was offered to patient  via MyChart   Nurse Notes: scheduled 2027 AWV appt "

## 2024-11-20 NOTE — Patient Instructions (Addendum)
 Ms. Goldman,  Thank you for taking the time for your Medicare Wellness Visit. I appreciate your continued commitment to your health goals. Please review the care plan we discussed, and feel free to reach out if I can assist you further.  Please note that Annual Wellness Visits do not include a physical exam. Some assessments may be limited, especially if the visit was conducted virtually. If needed, we may recommend an in-person follow-up with your provider.  Ongoing Care Seeing your primary care provider every 3 to 6 months helps us  monitor your health and provide consistent, personalized care.   Referrals If a referral was made during today's visit and you haven't received any updates within two weeks, please contact the referred provider directly to check on the status.  Recommended Screenings:  Health Maintenance  Topic Date Due   Zoster (Shingles) Vaccine (1 of 2) Never done   DTaP/Tdap/Td vaccine (3 - Td or Tdap) 06/10/2022   COVID-19 Vaccine (5 - 2025-26 season) 06/26/2024   Breast Cancer Screening  11/15/2024   Colon Cancer Screening  11/26/2024   Flu Shot  01/23/2025*   Hemoglobin A1C  12/23/2024   Kidney health urinalysis for diabetes  02/28/2025   Complete foot exam   02/28/2025   Eye exam for diabetics  08/07/2025   Yearly kidney function blood test for diabetes  10/03/2025   Medicare Annual Wellness Visit  11/20/2025   Pneumococcal Vaccine for age over 44  Completed   Osteoporosis screening with Bone Density Scan  Completed   Hepatitis C Screening  Completed   Meningitis B Vaccine  Aged Out  *Topic was postponed. The date shown is not the original due date.       11/20/2024    3:08 PM  Advanced Directives  Does Patient Have a Medical Advance Directive? Yes  Type of Estate Agent of [place];Living will  Does patient want to make changes to medical advance directive? Yes (Inpatient - patient requests chaplain consult to change a medical advance  directive)  Copy of Healthcare Power of Attorney in Chart? No - copy requested    Vision: Annual vision screenings are recommended for early detection of glaucoma, cataracts, and diabetic retinopathy. These exams can also reveal signs of chronic conditions such as diabetes and high blood pressure.  Dental: Annual dental screenings help detect early signs of oral cancer, gum disease, and other conditions linked to overall health, including heart disease and diabetes.

## 2024-11-24 ENCOUNTER — Encounter

## 2024-12-05 ENCOUNTER — Encounter

## 2024-12-18 ENCOUNTER — Other Ambulatory Visit

## 2024-12-25 ENCOUNTER — Encounter: Admitting: Family Medicine

## 2025-10-09 ENCOUNTER — Inpatient Hospital Stay

## 2025-10-09 ENCOUNTER — Inpatient Hospital Stay: Admitting: Oncology

## 2025-11-21 ENCOUNTER — Ambulatory Visit
